# Patient Record
Sex: Male | Born: 1950 | Race: White | Hispanic: No | Marital: Married | Smoking: Never smoker
Health system: Southern US, Community
[De-identification: ages and names within clinical notes are randomized; demographics above are authoritative.]

## PROBLEM LIST (undated history)

## (undated) DIAGNOSIS — K529 Noninfective gastroenteritis and colitis, unspecified: Secondary | ICD-10-CM

## (undated) DIAGNOSIS — K760 Fatty (change of) liver, not elsewhere classified: Secondary | ICD-10-CM

## (undated) DIAGNOSIS — N400 Enlarged prostate without lower urinary tract symptoms: Secondary | ICD-10-CM

## (undated) DIAGNOSIS — E039 Hypothyroidism, unspecified: Secondary | ICD-10-CM

## (undated) DIAGNOSIS — J45909 Unspecified asthma, uncomplicated: Secondary | ICD-10-CM

## (undated) DIAGNOSIS — J309 Allergic rhinitis, unspecified: Secondary | ICD-10-CM

## (undated) DIAGNOSIS — I2510 Atherosclerotic heart disease of native coronary artery without angina pectoris: Secondary | ICD-10-CM

## (undated) DIAGNOSIS — T7840XA Allergy, unspecified, initial encounter: Secondary | ICD-10-CM

## (undated) DIAGNOSIS — E785 Hyperlipidemia, unspecified: Secondary | ICD-10-CM

## (undated) DIAGNOSIS — K589 Irritable bowel syndrome without diarrhea: Secondary | ICD-10-CM

## (undated) DIAGNOSIS — A0472 Enterocolitis due to Clostridium difficile, not specified as recurrent: Secondary | ICD-10-CM

## (undated) DIAGNOSIS — I519 Heart disease, unspecified: Secondary | ICD-10-CM

## (undated) HISTORY — DX: Hypothyroidism, unspecified: E03.9

## (undated) HISTORY — PX: TONSILLECTOMY: SUR1361

## (undated) HISTORY — DX: Benign prostatic hyperplasia without lower urinary tract symptoms: N40.0

## (undated) HISTORY — DX: Heart disease, unspecified: I51.9

## (undated) HISTORY — DX: Allergic rhinitis, unspecified: J30.9

## (undated) HISTORY — DX: Fatty (change of) liver, not elsewhere classified: K76.0

## (undated) HISTORY — DX: Irritable bowel syndrome, unspecified: K58.9

## (undated) HISTORY — DX: Enterocolitis due to Clostridium difficile, not specified as recurrent: A04.72

## (undated) HISTORY — DX: Unspecified asthma, uncomplicated: J45.909

## (undated) HISTORY — DX: Noninfective gastroenteritis and colitis, unspecified: K52.9

## (undated) HISTORY — DX: Allergy, unspecified, initial encounter: T78.40XA

## (undated) HISTORY — PX: EYE SURGERY: SHX253

## (undated) HISTORY — DX: Hyperlipidemia, unspecified: E78.5

## (undated) HISTORY — DX: Atherosclerotic heart disease of native coronary artery without angina pectoris: I25.10

---

## 1988-05-13 HISTORY — PX: LUMBAR LAMINECTOMY: SHX95

## 1999-04-13 ENCOUNTER — Ambulatory Visit (HOSPITAL_COMMUNITY): Admission: RE | Admit: 1999-04-13 | Discharge: 1999-04-13 | Payer: Self-pay | Admitting: Pulmonary Disease

## 1999-04-13 ENCOUNTER — Encounter: Payer: Self-pay | Admitting: Pulmonary Disease

## 2000-05-13 HISTORY — PX: CORONARY ARTERY BYPASS GRAFT: SHX141

## 2001-02-19 ENCOUNTER — Encounter: Payer: Self-pay | Admitting: Thoracic Surgery (Cardiothoracic Vascular Surgery)

## 2001-02-19 ENCOUNTER — Encounter: Payer: Self-pay | Admitting: Internal Medicine

## 2001-02-19 ENCOUNTER — Inpatient Hospital Stay (HOSPITAL_COMMUNITY): Admission: EM | Admit: 2001-02-19 | Discharge: 2001-02-25 | Payer: Self-pay | Admitting: Emergency Medicine

## 2001-02-20 ENCOUNTER — Encounter: Payer: Self-pay | Admitting: Thoracic Surgery (Cardiothoracic Vascular Surgery)

## 2001-02-21 ENCOUNTER — Encounter: Payer: Self-pay | Admitting: Thoracic Surgery (Cardiothoracic Vascular Surgery)

## 2001-02-22 ENCOUNTER — Encounter: Payer: Self-pay | Admitting: Thoracic Surgery (Cardiothoracic Vascular Surgery)

## 2001-02-24 ENCOUNTER — Encounter: Payer: Self-pay | Admitting: *Deleted

## 2001-02-24 ENCOUNTER — Encounter: Payer: Self-pay | Admitting: Thoracic Surgery (Cardiothoracic Vascular Surgery)

## 2001-03-31 ENCOUNTER — Encounter (HOSPITAL_COMMUNITY): Admission: RE | Admit: 2001-03-31 | Discharge: 2001-06-29 | Payer: Self-pay | Admitting: Cardiology

## 2001-06-30 ENCOUNTER — Encounter (HOSPITAL_COMMUNITY): Admission: RE | Admit: 2001-06-30 | Discharge: 2001-09-28 | Payer: Self-pay | Admitting: Cardiology

## 2001-08-12 ENCOUNTER — Inpatient Hospital Stay (HOSPITAL_COMMUNITY): Admission: EM | Admit: 2001-08-12 | Discharge: 2001-08-13 | Payer: Self-pay | Admitting: Emergency Medicine

## 2001-08-12 ENCOUNTER — Encounter: Payer: Self-pay | Admitting: Emergency Medicine

## 2001-08-13 ENCOUNTER — Encounter: Payer: Self-pay | Admitting: Cardiology

## 2002-12-10 ENCOUNTER — Ambulatory Visit (HOSPITAL_COMMUNITY): Admission: RE | Admit: 2002-12-10 | Discharge: 2002-12-10 | Payer: Self-pay | Admitting: Pulmonary Disease

## 2002-12-10 ENCOUNTER — Encounter: Payer: Self-pay | Admitting: Pulmonary Disease

## 2002-12-11 ENCOUNTER — Inpatient Hospital Stay (HOSPITAL_COMMUNITY): Admission: EM | Admit: 2002-12-11 | Discharge: 2002-12-20 | Payer: Self-pay | Admitting: Emergency Medicine

## 2002-12-11 ENCOUNTER — Encounter: Payer: Self-pay | Admitting: Internal Medicine

## 2002-12-12 ENCOUNTER — Encounter (INDEPENDENT_AMBULATORY_CARE_PROVIDER_SITE_OTHER): Payer: Self-pay | Admitting: Specialist

## 2002-12-13 ENCOUNTER — Encounter: Payer: Self-pay | Admitting: Gastroenterology

## 2002-12-14 ENCOUNTER — Encounter: Payer: Self-pay | Admitting: Internal Medicine

## 2003-04-04 ENCOUNTER — Encounter: Payer: Self-pay | Admitting: Gastroenterology

## 2004-08-01 ENCOUNTER — Ambulatory Visit: Payer: Self-pay | Admitting: Gastroenterology

## 2004-08-03 ENCOUNTER — Ambulatory Visit: Payer: Self-pay | Admitting: Gastroenterology

## 2004-10-12 ENCOUNTER — Ambulatory Visit: Payer: Self-pay | Admitting: Pulmonary Disease

## 2005-01-10 ENCOUNTER — Ambulatory Visit: Payer: Self-pay | Admitting: Gastroenterology

## 2005-01-28 ENCOUNTER — Ambulatory Visit: Payer: Self-pay | Admitting: Gastroenterology

## 2005-02-04 ENCOUNTER — Ambulatory Visit: Payer: Self-pay | Admitting: Nurse Practitioner

## 2005-02-07 ENCOUNTER — Ambulatory Visit: Payer: Self-pay | Admitting: Cardiology

## 2005-03-27 ENCOUNTER — Ambulatory Visit: Payer: Self-pay | Admitting: Cardiology

## 2005-11-14 ENCOUNTER — Ambulatory Visit: Payer: Self-pay | Admitting: Cardiology

## 2005-12-31 ENCOUNTER — Ambulatory Visit: Payer: Self-pay | Admitting: Gastroenterology

## 2006-05-02 ENCOUNTER — Ambulatory Visit: Payer: Self-pay | Admitting: Gastroenterology

## 2006-07-03 ENCOUNTER — Ambulatory Visit: Payer: Self-pay | Admitting: Pulmonary Disease

## 2006-10-07 ENCOUNTER — Ambulatory Visit: Payer: Self-pay | Admitting: Internal Medicine

## 2006-10-07 ENCOUNTER — Ambulatory Visit (HOSPITAL_COMMUNITY): Admission: RE | Admit: 2006-10-07 | Discharge: 2006-10-07 | Payer: Self-pay | Admitting: Internal Medicine

## 2006-10-07 LAB — CONVERTED CEMR LAB
Basophils Absolute: 0 10*3/uL (ref 0.0–0.1)
Eosinophils Absolute: 0.2 10*3/uL (ref 0.0–0.6)
GFR calc non Af Amer: 74 mL/min
HCT: 42.7 % (ref 39.0–52.0)
Hemoglobin, Urine: NEGATIVE
Hemoglobin: 14.5 g/dL (ref 13.0–17.0)
Ketones, ur: NEGATIVE mg/dL
Leukocytes, UA: NEGATIVE
Lymphocytes Relative: 28.8 % (ref 12.0–46.0)
MCHC: 34 g/dL (ref 30.0–36.0)
MCV: 93.6 fL (ref 78.0–100.0)
Monocytes Absolute: 0.8 10*3/uL — ABNORMAL HIGH (ref 0.2–0.7)
Neutro Abs: 4.7 10*3/uL (ref 1.4–7.7)
Neutrophils Relative %: 58.7 % (ref 43.0–77.0)
Potassium: 3.6 meq/L (ref 3.5–5.1)
Sodium: 144 meq/L (ref 135–145)
Urobilinogen, UA: 0.2 (ref 0.0–1.0)

## 2006-10-13 ENCOUNTER — Ambulatory Visit: Payer: Self-pay | Admitting: Pulmonary Disease

## 2006-10-13 LAB — CONVERTED CEMR LAB
ALT: 36 units/L (ref 0–40)
AST: 35 units/L (ref 0–37)
Alkaline Phosphatase: 43 units/L (ref 39–117)
BUN: 16 mg/dL (ref 6–23)
Bilirubin, Direct: 0.1 mg/dL (ref 0.0–0.3)
CO2: 27 meq/L (ref 19–32)
Calcium: 9.4 mg/dL (ref 8.4–10.5)
Chloride: 107 meq/L (ref 96–112)
Cholesterol: 103 mg/dL (ref 0–200)
GFR calc non Af Amer: 67 mL/min
Glucose, Bld: 107 mg/dL — ABNORMAL HIGH (ref 70–99)
Total Protein: 6.9 g/dL (ref 6.0–8.3)

## 2007-06-19 ENCOUNTER — Ambulatory Visit: Payer: Self-pay | Admitting: Cardiology

## 2007-06-19 LAB — CONVERTED CEMR LAB
ALT: 31 units/L (ref 0–53)
AST: 31 units/L (ref 0–37)
Alkaline Phosphatase: 49 units/L (ref 39–117)
BUN: 14 mg/dL (ref 6–23)
Basophils Relative: 0.4 % (ref 0.0–1.0)
Bilirubin, Direct: 0.2 mg/dL (ref 0.0–0.3)
CO2: 29 meq/L (ref 19–32)
Calcium: 9.6 mg/dL (ref 8.4–10.5)
Chloride: 106 meq/L (ref 96–112)
Creatinine, Ser: 1.1 mg/dL (ref 0.4–1.5)
Eosinophils Relative: 6.1 % — ABNORMAL HIGH (ref 0.0–5.0)
Glucose, Bld: 103 mg/dL — ABNORMAL HIGH (ref 70–99)
LDL Cholesterol: 70 mg/dL (ref 0–99)
Lymphocytes Relative: 31.8 % (ref 12.0–46.0)
Monocytes Relative: 13 % — ABNORMAL HIGH (ref 3.0–11.0)
Neutro Abs: 2.9 10*3/uL (ref 1.4–7.7)
Platelets: 251 10*3/uL (ref 150–400)
RDW: 11.8 % (ref 11.5–14.6)
Total Bilirubin: 0.7 mg/dL (ref 0.3–1.2)
Total Protein: 6.4 g/dL (ref 6.0–8.3)
Triglycerides: 169 mg/dL — ABNORMAL HIGH (ref 0–149)
VLDL: 34 mg/dL (ref 0–40)
WBC: 5.7 10*3/uL (ref 4.5–10.5)

## 2007-07-22 ENCOUNTER — Ambulatory Visit: Payer: Self-pay | Admitting: Cardiology

## 2008-04-22 ENCOUNTER — Encounter: Payer: Self-pay | Admitting: Pulmonary Disease

## 2008-05-12 ENCOUNTER — Ambulatory Visit: Payer: Self-pay | Admitting: Pulmonary Disease

## 2008-05-12 ENCOUNTER — Telehealth (INDEPENDENT_AMBULATORY_CARE_PROVIDER_SITE_OTHER): Payer: Self-pay | Admitting: *Deleted

## 2008-05-12 DIAGNOSIS — K589 Irritable bowel syndrome without diarrhea: Secondary | ICD-10-CM | POA: Insufficient documentation

## 2008-05-12 DIAGNOSIS — I2510 Atherosclerotic heart disease of native coronary artery without angina pectoris: Secondary | ICD-10-CM

## 2008-05-12 DIAGNOSIS — E039 Hypothyroidism, unspecified: Secondary | ICD-10-CM | POA: Insufficient documentation

## 2008-05-12 DIAGNOSIS — J209 Acute bronchitis, unspecified: Secondary | ICD-10-CM

## 2008-05-12 DIAGNOSIS — E785 Hyperlipidemia, unspecified: Secondary | ICD-10-CM

## 2008-05-12 DIAGNOSIS — J309 Allergic rhinitis, unspecified: Secondary | ICD-10-CM | POA: Insufficient documentation

## 2008-05-12 DIAGNOSIS — Z87898 Personal history of other specified conditions: Secondary | ICD-10-CM

## 2008-05-13 HISTORY — PX: COLONOSCOPY: SHX174

## 2008-12-09 ENCOUNTER — Telehealth: Payer: Self-pay | Admitting: Pulmonary Disease

## 2009-03-17 ENCOUNTER — Ambulatory Visit: Payer: Self-pay | Admitting: Gastroenterology

## 2009-03-17 ENCOUNTER — Telehealth (INDEPENDENT_AMBULATORY_CARE_PROVIDER_SITE_OTHER): Payer: Self-pay

## 2009-03-17 ENCOUNTER — Encounter (INDEPENDENT_AMBULATORY_CARE_PROVIDER_SITE_OTHER): Payer: Self-pay

## 2009-03-17 ENCOUNTER — Telehealth: Payer: Self-pay | Admitting: Cardiology

## 2009-03-28 ENCOUNTER — Ambulatory Visit: Payer: Self-pay | Admitting: Gastroenterology

## 2009-05-11 ENCOUNTER — Telehealth: Payer: Self-pay | Admitting: Cardiology

## 2009-05-15 ENCOUNTER — Ambulatory Visit: Payer: Self-pay | Admitting: Cardiology

## 2009-05-16 ENCOUNTER — Ambulatory Visit: Payer: Self-pay | Admitting: Cardiology

## 2009-05-16 ENCOUNTER — Encounter: Payer: Self-pay | Admitting: Physician Assistant

## 2009-05-16 DIAGNOSIS — R0609 Other forms of dyspnea: Secondary | ICD-10-CM

## 2009-05-16 DIAGNOSIS — R079 Chest pain, unspecified: Secondary | ICD-10-CM | POA: Insufficient documentation

## 2009-05-16 DIAGNOSIS — R0989 Other specified symptoms and signs involving the circulatory and respiratory systems: Secondary | ICD-10-CM

## 2009-05-17 ENCOUNTER — Telehealth (INDEPENDENT_AMBULATORY_CARE_PROVIDER_SITE_OTHER): Payer: Self-pay | Admitting: *Deleted

## 2009-05-18 ENCOUNTER — Ambulatory Visit: Payer: Self-pay | Admitting: Internal Medicine

## 2009-05-18 ENCOUNTER — Encounter: Payer: Self-pay | Admitting: Cardiology

## 2009-05-18 ENCOUNTER — Ambulatory Visit: Payer: Self-pay

## 2009-05-18 ENCOUNTER — Encounter (HOSPITAL_COMMUNITY): Admission: RE | Admit: 2009-05-18 | Discharge: 2009-07-17 | Payer: Self-pay | Admitting: Cardiology

## 2009-05-18 ENCOUNTER — Encounter: Payer: Self-pay | Admitting: Cardiovascular Disease

## 2009-05-22 LAB — CONVERTED CEMR LAB
ALT: 37 units/L (ref 0–53)
AST: 33 units/L (ref 0–37)
BUN: 11 mg/dL (ref 6–23)
Basophils Relative: 0.7 % (ref 0.0–3.0)
Bilirubin, Direct: 0.1 mg/dL (ref 0.0–0.3)
Calcium: 9.3 mg/dL (ref 8.4–10.5)
Eosinophils Absolute: 0.4 10*3/uL (ref 0.0–0.7)
Eosinophils Relative: 6.7 % — ABNORMAL HIGH (ref 0.0–5.0)
GFR calc non Af Amer: 66.15 mL/min (ref >60)
Glucose, Bld: 101 mg/dL — ABNORMAL HIGH (ref 70–99)
HCT: 43.2 % (ref 39.0–52.0)
Lymphs Abs: 1.9 10*3/uL (ref 0.7–4.0)
MCHC: 34.1 g/dL (ref 30.0–36.0)
MCV: 95.1 fL (ref 78.0–100.0)
Monocytes Absolute: 0.6 10*3/uL (ref 0.1–1.0)
Neutrophils Relative %: 46.5 % (ref 43.0–77.0)
Platelets: 200 10*3/uL (ref 150.0–400.0)
Potassium: 4.2 meq/L (ref 3.5–5.1)
Sodium: 141 meq/L (ref 135–145)
TSH: 2.99 microintl units/mL (ref 0.35–5.50)
Total Bilirubin: 0.7 mg/dL (ref 0.3–1.2)
Total CHOL/HDL Ratio: 5
Triglycerides: 184 mg/dL — ABNORMAL HIGH (ref 0.0–149.0)
WBC: 5.3 10*3/uL (ref 4.5–10.5)

## 2009-05-31 ENCOUNTER — Encounter (INDEPENDENT_AMBULATORY_CARE_PROVIDER_SITE_OTHER): Payer: Self-pay | Admitting: *Deleted

## 2009-08-08 ENCOUNTER — Encounter: Payer: Self-pay | Admitting: Pulmonary Disease

## 2009-08-14 ENCOUNTER — Ambulatory Visit: Payer: Self-pay | Admitting: Cardiology

## 2009-08-16 ENCOUNTER — Telehealth: Payer: Self-pay | Admitting: Pulmonary Disease

## 2009-08-16 LAB — CONVERTED CEMR LAB
AST: 34 units/L (ref 0–37)
Alkaline Phosphatase: 41 units/L (ref 39–117)
Bilirubin, Direct: 0.1 mg/dL (ref 0.0–0.3)
LDL Cholesterol: 61 mg/dL (ref 0–99)
Total CHOL/HDL Ratio: 3

## 2009-11-22 ENCOUNTER — Ambulatory Visit: Payer: Self-pay | Admitting: Pulmonary Disease

## 2009-11-27 DIAGNOSIS — H60399 Other infective otitis externa, unspecified ear: Secondary | ICD-10-CM | POA: Insufficient documentation

## 2010-01-17 ENCOUNTER — Telehealth: Payer: Self-pay | Admitting: Gastroenterology

## 2010-01-18 ENCOUNTER — Ambulatory Visit: Payer: Self-pay | Admitting: Physician Assistant

## 2010-01-18 ENCOUNTER — Ambulatory Visit: Payer: Self-pay | Admitting: Gastroenterology

## 2010-01-18 ENCOUNTER — Telehealth: Payer: Self-pay | Admitting: Internal Medicine

## 2010-01-18 DIAGNOSIS — I10 Essential (primary) hypertension: Secondary | ICD-10-CM

## 2010-01-18 DIAGNOSIS — R1032 Left lower quadrant pain: Secondary | ICD-10-CM

## 2010-01-18 DIAGNOSIS — K648 Other hemorrhoids: Secondary | ICD-10-CM | POA: Insufficient documentation

## 2010-01-18 DIAGNOSIS — E0590 Thyrotoxicosis, unspecified without thyrotoxic crisis or storm: Secondary | ICD-10-CM | POA: Insufficient documentation

## 2010-01-18 DIAGNOSIS — K310 Acute dilatation of stomach: Secondary | ICD-10-CM

## 2010-01-18 LAB — CONVERTED CEMR LAB
ALT: 34 units/L (ref 0–53)
Alkaline Phosphatase: 41 units/L (ref 39–117)
Basophils Absolute: 0 10*3/uL (ref 0.0–0.1)
CO2: 29 meq/L (ref 19–32)
Creatinine, Ser: 1.1 mg/dL (ref 0.4–1.5)
Eosinophils Absolute: 0.3 10*3/uL (ref 0.0–0.7)
GFR calc non Af Amer: 71.46 mL/min (ref >60)
HCT: 44.5 % (ref 39.0–52.0)
Hemoglobin: 15.2 g/dL (ref 13.0–17.0)
Lymphs Abs: 2.2 10*3/uL (ref 0.7–4.0)
MCHC: 34.2 g/dL (ref 30.0–36.0)
MCV: 95.4 fL (ref 78.0–100.0)
Monocytes Absolute: 0.6 10*3/uL (ref 0.1–1.0)
Neutro Abs: 3.6 10*3/uL (ref 1.4–7.7)
Platelets: 233 10*3/uL (ref 150.0–400.0)
RDW: 13.4 % (ref 11.5–14.6)
Total Bilirubin: 0.7 mg/dL (ref 0.3–1.2)

## 2010-01-19 ENCOUNTER — Telehealth: Payer: Self-pay | Admitting: Physician Assistant

## 2010-01-19 ENCOUNTER — Ambulatory Visit: Payer: Self-pay | Admitting: Internal Medicine

## 2010-06-12 NOTE — Medication Information (Signed)
Summary: Prescription Refill Order  Prescription Refill Order   Imported By: Roderic Ovens 05/26/2009 13:16:53  _____________________________________________________________________  External Attachment:    Type:   Image     Comment:   External Document

## 2010-06-12 NOTE — Assessment & Plan Note (Signed)
Summary: Cardiology Nuclear Study  Nuclear Med Background Indications for Stress Test: Evaluation for Ischemia, Graft Patency  Indications Comments: Recent Colonoscopy with decrease in O2 sat on O2 had flu with URI  History: CABG, COPD, Heart Catheterization, Myocardial Perfusion Study  History Comments: 10/02 CABG X 5 02/03 MPS (-) ischemia (-) scar  64% 04/03 Heart Cath EF 60% patent Grafts COPD  Symptoms: DOE    Nuclear Pre-Procedure Cardiac Risk Factors: Lipids Caffeine/Decaff Intake: None NPO After: 9:00 PM Lungs: clear IV 0.9% NS with Angio Cath: 22g     IV Site: (R) AC IV Started by: Irean Hong RN Chest Size (in) 44     Height (in): 68 Weight (lb): 188 BMI: 28.69  Nuclear Med Study 1 or 2 day study:  1 day     Stress Test Type:  Stress Reading MD:  Dietrich Pates, MD     Referring MD:  B.Brodie Resting Radionuclide:  Technetium 33m Tetrofosmin     Resting Radionuclide Dose:  11.0 mCi  Stress Radionuclide:  Technetium 35m Tetrofosmin     Stress Radionuclide Dose:  33.0 mCi   Stress Protocol Exercise Time (min):  11:30 min     Max HR:  139 bpm     Predicted Max HR:  163 bpm  Max Systolic BP: 168 mm Hg     Percent Max HR:  85.28 %     METS: 13.4 Rate Pressure Product:  69629    Stress Test Technologist:  Milana Na EMT-P     Nuclear Technologist:  Burna Mortimer Deal RT-N  Rest Procedure  Myocardial perfusion imaging was performed at rest 45 minutes following the intravenous administration of Myoview Technetium 32m Tetrofosmin.  Stress Procedure  The patient exercised for 11:30.  The patient stopped due to fatigue and chest pain.  There were no significant ST-T wave changes.  Myoview was injected at peak exercise and myocardial perfusion imaging was performed after a brief delay.  QPS Raw Data Images:  Images were motion corrected.  Soft tissue (diaphragm) underlies heart. Stress Images:  There is normal uptake in all areas. Rest Images:  Normal homogeneous uptake  in all areas of the myocardium. Subtraction (SDS):  No evidence of ischemia. Transient Ischemic Dilatation:  .85  (Normal <1.22)  Lung/Heart Ratio:  .39  (Normal <0.45)  Quantitative Gated Spect Images QGS EDV:  57 ml QGS ESV:  16 ml QGS EF:  71 %   Overall Impression  Exercise Capacity: Excellent exercise capacity. BP Response: Normal blood pressure response. Clinical Symptoms: Mild chest pain/dyspnea. ECG Impression: No significant ST segment change suggestive of ischemia. Overall Impression: Normal stress nuclear study.  Appended Document: Cardiology Nuclear Study Heather, Can you tell ok. f/u 1 yr. BB  Appended Document: Cardiology Nuclear [facility].  Appended Document: Cardiology Nuclear [facility].  Appended Document: Cardiology Nuclear [facility].  Appended Document: Cardiology Nuclear Study Letter of results mailed to the pt.

## 2010-06-12 NOTE — Miscellaneous (Signed)
Summary: Outpatient Coinsurance Notice  Outpatient Coinsurance Notice   Imported By: Marylou Mccoy 05/23/2009 15:38:31  _____________________________________________________________________  External Attachment:    Type:   Image     Comment:   External Document

## 2010-06-12 NOTE — Letter (Signed)
Summary: Generic Letter  Architectural technologist, Main Office  [address]   [place], [place] [address]   Phone: [phone]  Fax: [phone]        May 31, 2009 MRN: [id number]    Samuel Flowers [address] [place], [place]  [address]    Dear Mr. Wessell,  I have been trying to reach you by phone to let you know that Dr. Juanda Chance has reviewed your stress test results and they are ok. You are scheduled to followup with Dr. Juanda Chance on 07/07/09 @ 8:45am, however, he has stated that since your stress test is alright, that you really do not need to see him back for a year. If you wish to keep your appointment in February with Dr. Juanda Chance, that is fine though. Please contact our office if you wish to cancel your February appointment.          Sincerely,  Sherri Rad, RN, BSN  This letter has been electronically signed by your physician.

## 2010-06-12 NOTE — Progress Notes (Signed)
Summary: RX refill   Phone Note Outgoing Call   Call placed by: Sherri Rad, RN, BSN,  August 16, 2009 10:25 AM Call placed to: Patient Summary of Call: I called the pt with his lab results. He asked if we could send in an RX refill for his Synthroid. I made the pt aware that we usually do not do this. Dr. Kriste Basque is who usually prescribes this. He asked if we could contact Dr. Jodelle Green office. I made the pt aware that I would forward a message to them. He gets his meds filled throught [facility]. He asked if Dr. Jodelle Green office could please contact him when this has been done.  Initial call taken by: Sherri Rad, RN, BSN,  August 16, 2009 10:25 AM  Follow-up for Phone Call        pt was last seen in 2009---he will need ov with SN for any refills.  thanks Randell Loop CMA  August 16, 2009 10:54 AM   LMTCB. Carron Curie CMA  August 16, 2009 11:31 AM  LMTCB x2. Carron Curie CMA  August 17, 2009 11:15 AM  LMTCBx3. Carron Curie CMA  August 18, 2009 3:25 PM   Additional Follow-up for Phone Call Additional follow up Details #1::        SN booked until June- is it okay to wait until then or do you want to see him sooner? Please advise, thanks Vernie Murders  August 18, 2009 3:54 PM   CAN USE THE 4-12 AT 10:30 FOR HIM. per SN you can send in his synthroid  1 po qd  #90 for him but he will still need to be seen. THANKS Randell Loop [facility]  August 18, 2009 4:04 PM     Additional Follow-up for Phone Call Additional follow up Details #2::    LMTCB Vernie Murders  August 18, 2009 4:06 PM   Left message with assistant for pt to call back for Triage, however will need a new appointment date. Please advise. Zackery Barefoot CMA  August 22, 2009 10:17 AM    5-20 AT 11:30.  Sheppard Evens CMA  August 22, 2009 10:26 AM   Additional Follow-up for Phone Call Additional follow up Details #3:: Details for Additional Follow-up Action Taken: LM at [phone] and [phone] for pt to call us  about synthroid rx and appt date. Additional Follow-up by: Abigail Miyamoto RN,  August 24, 2009 9:49 AM  Pt  returned call, call pt at [phone]. taken by [facility]/jrc  Called pt back and he states he was about to go into a meeting so he couldnt talk. i told him that Sn had one available slot on 09-29-09 at 11:30. I would schedule him for that appt and if there was an issue with it he could call and r/s, but that there were no other appts until later in summer. Pt scheduled for appt. Carron Curie CMA  August 25, 2009 1:57 PM  Appended Document: RX refill after getting off the phone, i checked the schedule and this appt had already been taken, so per [place] ok to offer 09-15-09 at 10am, advise pt to come in fasting. I called and LM on pt cell of teh change and advised him to call back to confirm the appt.

## 2010-06-12 NOTE — Assessment & Plan Note (Signed)
Summary: rov      Allergies Added: NKDA  Visit Type:  rov  CC:  pt had colonoscopy 3 weeks ago and was told that his O2 was low during procedure even w/O2 on...  History of Present Illness: This is a 60 year old white male patient, who is here today because of an oxygen sat dropped while undergoing colonoscopy. He now thinks he may have had the flu and upper respiratory infection prior to undergoing colonoscopy and that  his O2 sats were down because of this. He walks 40-60 minutes upgrade on a treadmill for 3-1/2 miles per hour 3 times a week without symptoms. He occasionally gets out of breath when going up stairs, but this has not changed recently. He has a history of coronary artery disease, status post CABG in 2002, and last catheter in 2003, which showed patent grafts that stenosis and a posterolateral branch. That was not grafted and was treated medically. He denies chest pain, palpitations, dyspnea, dizziness, or presyncope.  He did have fasting lipid panel drawn yesterday, and all his lab values are up compared to 2 years ago, when he had them checked. He states he has not changed. His diet and I had already scheduled him to see the lipid clinic. When he realized he has not been taking his Crestor since last June. He uses [facility] and says he has trouble keeping track of his medications.  Current Medications (verified): 1)  Claritin 10 Mg Tabs (Loratadine) .... Take 1 Tab By Mouth Once Daily As Needed For Allergy Symptoms.Marland KitchenMarland Kitchen 2)  Aspirin Adult Low Strength 81 Mg Tbec (Aspirin) .... Take 1 Tablet By Mouth Once A Day 3)  Plavix 75 Mg Tabs (Clopidogrel Bisulfate) .... Take 1 Tablet By Mouth Once A Day 4)  Crestor 10 Mg Tabs (Rosuvastatin Calcium) .... Take 1 Tablet By Mouth Once A Day 5)  Tricor 145 Mg Tabs (Fenofibrate) .... Take 1 Tablet By Mouth Once A Day 6)  Synthroid 75 Mcg Tabs (Levothyroxine Sodium) .... Take 1 Tablet By Mouth Once A Day 7)  Flomax 0.4 Mg Xr24h-Cap (Tamsulosin Hcl)  .... Take 2 Tablets By Mouth Once Daily  Allergies (verified): No Known Drug Allergies  Past History:  Past Medical History: Last updated: 05/12/2008   ALLERGIC RHINITIS (ICD-477.9) ASTHMATIC BRONCHITIS, ACUTE (ICD-466.0) CORONARY ARTERY DISEASE (ICD-414.00) HYPERLIPIDEMIA (ICD-272.4) HYPOTHYROIDISM (ICD-244.9) IRRITABLE BOWEL SYNDROME (ICD-564.1) BENIGN PROSTATIC HYPERTROPHY, MILD, HX OF (ICD-V13.8)  Past Surgical History: Last updated: 05/12/2008 S/P T & A S/P coronary artery bypass graft -2002 S/P lumbar laminectomy  ~ 1990  Review of Systems       see history of present illness  Vital Signs:  Patient profile:   60 year old male Height:      68 inches Weight:      189 pounds BMI:     28.84 O2 Sat:      91 % on Room air Pulse rate:   55 / minute Pulse rhythm:   irregular BP sitting:   128 / 72  (right arm) Cuff size:   regular  Vitals Entered By: Danielle Rankin, CMA (May 16, 2009 10:18 AM)  O2 Flow:  Room air  Physical Exam  General:   Well-nournished, in no acute distress. Neck: No JVD, HJR, Bruit, or thyroid enlargement Lungs: No tachypnea, clear without wheezing, rales, or rhonchi Cardiovascular: RRR, PMI not displaced, heart sounds normal, no murmurs, gallops, bruit, thrill, or heave. Abdomen: BS normal. Soft without organomegaly, masses, lesions or tenderness. Extremities: without cyanosis,  clubbing or edema. Good distal pulses bilateral SKin: Warm, no lesions or rashes  Musculoskeletal: No deformities Neuro: no focal signs    Impression & Recommendations:  Problem # 1:  DYSPNEA ON EXERTION (ICD-786.09) Patient had a drop in his O2 sat while undergone colonoscopy. Not sure if this was due to this the anesthesia and his possible upper respiratory infection that he had prior to undergoing this. O2 Sat when he was laying down here in the office was 91% on room air and when he sat up, was 95%. His lungs are clear. He does have some dyspnea when he goes  up stairs. With his history of coronary artery disease. Will schedule an exercise Myoview for further evaluation His updated medication list for this problem includes:    Aspirin Adult Low Strength 81 Mg Tbec (Aspirin) .Marland Kitchen... Take 1 tablet by mouth once a day  Orders: Nuclear Stress Test (Nuc Stress Test)  Problem # 2:  CORONARY ARTERY DISEASE (ICD-414.00) Patient has history of CABG in 2002. Last catheter in 2003. See history of present illness. We'll check a stress Myoview to rule out ischemia. His updated medication list for this problem includes:    Aspirin Adult Low Strength 81 Mg Tbec (Aspirin) .Marland Kitchen... Take 1 tablet by mouth once a day    Plavix 75 Mg Tabs (Clopidogrel bisulfate) .Marland Kitchen... Take 1 tablet by mouth once a day  Orders: Nuclear Stress Test (Nuc Stress Test)  Problem # 3:  HYPERLIPIDEMIA (ICD-272.4) Patient's cholesterol was up to 209, triglycerides 184, HDL was actually good at 42, and LDL was up at 138. All these values were elevated compared to his last tracings. He now admits to this document his Crestor inadvertently back in June. We will restart his Crestor and recheck his lipid panels in 3 months. His updated medication list for this problem includes:    Crestor 10 Mg Tabs (Rosuvastatin calcium) .Marland Kitchen... Take 1 tablet by mouth once a day    Tricor 145 Mg Tabs (Fenofibrate) .Marland Kitchen... Take 1 tablet by mouth once a day  Patient Instructions: 1)  Your physician recommends that you schedule a follow-up appointment in: 1 - 2 months with Dr. Juanda Chance. 2)  Your physician has requested that you have an exercise stress myoview.  For further information please visit [email or web address]  Please follow instruction sheet, as given. 3)  [facility]  [phone].  You will need your member number and the prescription number(s) on the prescription bottles. Prescriptions: TRICOR 145 MG TABS (FENOFIBRATE) Take 1 tablet by mouth once a day  #90 x 3   Entered by:   Minerva Areola, RN, BSN    Authorized by:   Marletta Lor, PA-C   Signed by:   Minerva Areola, RN, BSN on 05/16/2009   Method used:   Print then Give to Patient   RxID:   [id number] CRESTOR 10 MG TABS (ROSUVASTATIN CALCIUM) Take 1 tablet by mouth once a day  #90 x 3   Entered by:   Minerva Areola, RN, BSN   Authorized by:   Marletta Lor, PA-C   Signed by:   Minerva Areola, RN, BSN on 05/16/2009   Method used:   Print then Give to Patient   RxID:   [id number] PLAVIX 75 MG TABS (CLOPIDOGREL BISULFATE) Take 1 tablet by mouth once a day  #90 x 3   Entered by:   Minerva Areola, RN, BSN   Authorized by:   Marletta Lor, PA-C   Signed by:  Minerva Areola, RN, BSN on 05/16/2009   Method used:   Print then Give to Patient   RxID:   [id number]

## 2010-06-12 NOTE — Progress Notes (Signed)
Summary: info update   Phone Note Call from Patient   Caller: Patient Call For: Samuel Flowers Reason for Call: Talk to Nurse Summary of Call: would like to be called at [phone] with CT scan results... and if any meds need to becalled in, pt would like it sent to [facility] [place] Initial call taken by: [name],  January 19, 2010 2:50 PM  Follow-up for Phone Call        Samuel Gip PA spoke with the patient on Friday Follow-up by: [name] RN, CGRN,  January 22, 2010 8:22 AM

## 2010-06-12 NOTE — Progress Notes (Signed)
Summary: Nuclear Pre-Procedure  Phone Note Outgoing Call   Call placed by: Milana Na, EMT-P,  May 17, 2009 2:04 PM Summary of Call: Left message with information on Myoview Information Sheet (see scanned document for details).      Nuclear Med Background Indications for Stress Test: Evaluation for Ischemia, Graft Patency  Indications Comments: Recent Colonoscopy with decrease in O2 sat on O2 had flu with URI  History: CABG, COPD, Heart Catheterization, Myocardial Perfusion Study  History Comments: 10/02 CABG X 5 02/03 MPS (-) ischemia (-) scar  64% 04/03 Heart Cath EF 60% patent Grafts COPD  Symptoms: DOE    Nuclear Pre-Procedure Cardiac Risk Factors: Lipids Height (in): 68  Nuclear Med Study Referring MD:  B.Juanda Chance

## 2010-06-12 NOTE — Progress Notes (Signed)
Summary: triage   Phone Note Call from Patient Call back at [phone]   Caller: Patient Call For: Dr. Russella Dar Reason for Call: Talk to Nurse Summary of Call: abd pain, bloating, cramping... would like to discuss with a nurse before scheduling an appt Initial call taken by: [name],  January 17, 2010 8:10 AM  Follow-up for Phone Call        patient with hx of colitis and IBS.  For past several days lower abdominal pain and diarrhea.  Patient is traveling next week and would like to be seen before he leaves the country.  Patient  will come in and see [name] PA tomorrow at 2:00 Follow-up by: [name] RN, CGRN,  January 17, 2010 9:56 AM

## 2010-06-12 NOTE — Letter (Signed)
[facility]: [facility]   Imported By: Sherian Rein 08/23/2009 11:59:33  _____________________________________________________________________  External Attachment:    Type:   Image     Comment:   External Document

## 2010-06-12 NOTE — Progress Notes (Signed)
Summary: ? labs before visit   Phone Note Call from Patient   Summary of Call: called at about 7 AM he is wondering if he needs labs for todays visit very concerned about getting things set up before he travels, leaving first of next week  Have him get a cbc, CMET and sed rate this AM so Amy has it available today...use 789.09 as dx if not on list Initial call taken by: Iva Boop MD, Clementeen Graham,  January 18, 2010 8:27 AM  Follow-up for Phone Call        The pt was told to come in this afternoon before his appt with [place] at 2Pm and we will draw labs on him. Follow-up by: Joselyn Glassman,  January 18, 2010 2:04 PM

## 2010-06-12 NOTE — Assessment & Plan Note (Signed)
Summary: IBS flare/sheri    History of Present Illness Visit Type: new patient  Primary GI MD: Elie Goody MD [facility] Primary Provider: Michele Mcalpine, MD  Requesting Provider: na Chief Complaint: GERD, lower abd pain, bloating, and change in bowel habits  History of Present Illness:   60 YO MALE KNOWN TO DR. Kristin Bruins SEEN IN 2010,WHEN HE HAD A COLONOSCOPY. THIS WAS NORMAL,EXCEPT INTERNAL HEMORRHOIDS.INTERESTINGLY HE WAS DX WITH DIVERTICULITIS IN 2008 PER CT WITH PERICOLONOC STARNDING AT JUNCTION OF SIGMOID AND  DESCENDING COLON. HE WAS ALSO QUITE ILL IN 2004 WITH C. DIFF. COLITIS.,AND WAS HOSPITALIZED. HE COMES IN NOW WITH  4 DAY HX OF ABDOMINAL BLOATING  AND PAIN. ONSET AFTER HE ATE RAW CORN AND FRIED GREEN TOMATOES. HE HAS HAD PERIODIC EPISODES OF BLOATING ,AND IRREGULAR BOWEL HABITS BUT THIS PAIN IS WORSE.HE IS CONCERNED AS HE IS GOING ON A TRIP NEXT WEEK,IN [place], THEN THE FOLLOWING WEEK TO [place]. NO FEVER. NO NAUSEA OR VOMITING. HE IS HURTING PRIMARILY IN THE LEFT ABDOMEN,WITH A CRAMPY  TYPE PAIN.    GI Review of Systems    Reports abdominal pain, acid reflux, bloating, and  heartburn.     Location of  Abdominal pain: lower abdomen.    Denies belching, chest pain, dysphagia with liquids, dysphagia with solids, loss of appetite, nausea, vomiting, vomiting blood, weight loss, and  weight gain.      Reports change in bowel habits and  irritable bowel syndrome.     Denies anal fissure, black tarry stools, constipation, diarrhea, diverticulosis, fecal incontinence, heme positive stool, hemorrhoids, jaundice, light color stool, liver problems, rectal bleeding, and  rectal pain.    Current Medications (verified): 1)  Claritin 10 Mg Tabs (Loratadine) .... Take 1 Tab By Mouth Once Daily As Needed For Allergy Symptoms.Marland KitchenMarland Kitchen 2)  Aspirin Adult Low Strength 81 Mg Tbec (Aspirin) .... Take 1 Tablet By Mouth Once A Day 3)  Plavix 75 Mg Tabs (Clopidogrel Bisulfate) .... Take 1 Tablet By Mouth  Once A Day 4)  Crestor 10 Mg Tabs (Rosuvastatin Calcium) .... Take 1 Tablet By Mouth Once A Day 5)  Tricor 145 Mg Tabs (Fenofibrate) .... Take 1 Tablet By Mouth Once A Day 6)  Synthroid 75 Mcg Tabs (Levothyroxine Sodium) .... Take 1 Tablet By Mouth Once A Day 7)  Flomax 0.4 Mg Xr24h-Cap (Tamsulosin Hcl) .... Take 2 Tablets By Mouth Once Daily  Allergies (verified): No Known Drug Allergies  Past History:  Past Medical History: ALLERGIC RHINITIS (ICD-477.9) ASTHMATIC BRONCHITIS, ACUTE (ICD-466.0) CORONARY ARTERY DISEASE (ICD-414.00)-S/P CABG 2002 HYPERLIPIDEMIA (ICD-272.4) HYPOTHYROIDISM (ICD-244.9) IRRITABLE BOWEL SYNDROME (ICD-564.1) BENIGN PROSTATIC HYPERTROPHY, MILD, HX OF (ICD-V13.8) HX OF C.DIFF 2004,  Past Surgical History: Reviewed history from 05/12/2008 and no changes required. S/P T & A S/P coronary artery bypass graft -2002 S/P lumbar laminectomy  ~ 1990  Family History: emphysema - mother asthma - brother heart disease - father DM - father No FH of Colon Cancer:  Social History: never smoked social alcohol married 2 children works in Insurance account manager Daily Caffeine Use: 2 daily Illicit Drug Use - no Drug Use:  no  Review of Systems  The patient denies allergy/sinus, anemia, anxiety-new, arthritis/joint pain, back pain, blood in urine, breast changes/lumps, change in vision, confusion, cough, coughing up blood, depression-new, fainting, fatigue, fever, headaches-new, hearing problems, heart murmur, heart rhythm changes, itching, menstrual pain, muscle pains/cramps, night sweats, nosebleeds, pregnancy symptoms, shortness of breath, skin rash, sleeping problems, sore throat, swelling of feet/legs, swollen lymph  glands, thirst - excessive , urination - excessive , urination changes/pain, urine leakage, vision changes, and voice change.         SEE HPI  Vital Signs:  Patient profile:   60 year old male Height:      68 inches Weight:      190 pounds BMI:      28.99 BSA:     2.00 Pulse rate:   56 / minute Pulse rhythm:   irregular BP sitting:   126 / 64  (left arm) Cuff size:   regular  Vitals Entered By: Ok Anis CMA (January 18, 2010 2:09 PM)  Physical Exam  General:  Well developed, well nourished, no acute distress. Head:  Normocephalic and atraumatic. Eyes:  PERRLA, no icterus. Lungs:  Clear throughout to auscultation. Heart:  Regular rate and rhythm; no murmurs, rubs,  or bruits.,STERNAL SCAR Abdomen:  SOFT, BS+, NO PALP MASS OR HSM,BS =, TENDER LLQ/LMQ,NO REAL GUARDING BUT HAS REBOUND ON THE LEFT. Rectal:  NOT DONE Extremities:  No clubbing, cyanosis, edema or deformities noted. Neurologic:  Alert and  oriented x4;  grossly normal neurologically. Psych:  Alert and cooperative. Normal mood and affect.   Impression & Recommendations:  Problem # 1:  ABDOMINAL PAIN, LEFT LOWER QUADRANT (ICD-789.04) Assessment New 60 YO MALE WITH  REMOTE HX OF "COLITIS",IN THE 80'S,NOT CONFIRMED ON FOLLOW UP PROCEDURES. HX OF SEVERE C. DIFF 2004,TREATED FOR DIVERTICULITIS IN 2008 PER CT WITH PERICOLONIC STARNDING. NORMAL COLON 2010 WITH NO DIVERTICULI NOTED. NOW WITH 4 DAY HX OF LEFT ABDOMINAL PAIN,BLOATIG, CRAMPING.  R/O IBS,R/O DIVERTICULITIS ?SUBTLE TICS?  LABS AS BELOW CT ABDOMEN /PELVIS-FEEL THIS IS INDICATED FOR DX, AND IN LIGHT OF HIS UPCOMING TRIP OUT OF THE COUNTRY. FURTHER PALNS PENDING ABOVE Orders: CT Abdomen/Pelvis with Contrast (CT Abd/Pelvis w/con)  Problem # 2:  HEMORRHOIDS-INTERNAL (ICD-455.0) Assessment: Comment Only  Problem # 3:  CORONARY ARTERY DISEASE (ICD-414.00) Assessment: Comment Only  Problem # 4:  HYPERTENSION (ICD-401.9) Assessment: Comment Only  Patient Instructions: 1)  We schedueld the CT scan at [facility] CT [address]  2)  We will be calling you with the results.  They will be calling Amy Esterwood tomorrow afternoon  01-19-10 with the results.  3)  Copy sent to : Alroy Dust, Md 4)  The medication  list was reviewed and reconciled.  All changed / newly prescribed medications were explained.  A complete medication list was provided to the patient / caregiver.

## 2010-06-12 NOTE — Assessment & Plan Note (Signed)
Summary: Acute NP office visit - swimmer's ear   CC:  left earache with pain when swallowing x2days - states was at the beach this past weekend.  History of Present Illness: 60 y/o WM   11/22/09--Presents for a left earache with pain when swallowing x2days - states was at the beach this past weekend. No fever, known injury, draiange. No meds for tx. No change in hearing. Denies chest pain, dyspnea, orthopnea, hemoptysis, fever, n/v/d, edema, headache.      Preventive Screening-Counseling & Management  Alcohol-Tobacco     Smoking Status: never  Medications Prior to Update: 1)  Claritin 10 Mg Tabs (Loratadine) .... Take 1 Tab By Mouth Once Daily As Needed For Allergy Symptoms.Marland KitchenMarland Kitchen 2)  Aspirin Adult Low Strength 81 Mg Tbec (Aspirin) .... Take 1 Tablet By Mouth Once A Day 3)  Plavix 75 Mg Tabs (Clopidogrel Bisulfate) .... Take 1 Tablet By Mouth Once A Day 4)  Crestor 10 Mg Tabs (Rosuvastatin Calcium) .... Take 1 Tablet By Mouth Once A Day 5)  Tricor 145 Mg Tabs (Fenofibrate) .... Take 1 Tablet By Mouth Once A Day 6)  Synthroid 75 Mcg Tabs (Levothyroxine Sodium) .... Take 1 Tablet By Mouth Once A Day 7)  Flomax 0.4 Mg Xr24h-Cap (Tamsulosin Hcl) .... Take 2 Tablets By Mouth Once Daily  Current Medications (verified): 1)  Claritin 10 Mg Tabs (Loratadine) .... Take 1 Tab By Mouth Once Daily As Needed For Allergy Symptoms.Marland KitchenMarland Kitchen 2)  Aspirin Adult Low Strength 81 Mg Tbec (Aspirin) .... Take 1 Tablet By Mouth Once A Day 3)  Plavix 75 Mg Tabs (Clopidogrel Bisulfate) .... Take 1 Tablet By Mouth Once A Day 4)  Crestor 10 Mg Tabs (Rosuvastatin Calcium) .... Take 1 Tablet By Mouth Once A Day 5)  Tricor 145 Mg Tabs (Fenofibrate) .... Take 1 Tablet By Mouth Once A Day 6)  Synthroid 75 Mcg Tabs (Levothyroxine Sodium) .... Take 1 Tablet By Mouth Once A Day 7)  Flomax 0.4 Mg Xr24h-Cap (Tamsulosin Hcl) .... Take 2 Tablets By Mouth Once Daily  Allergies (verified): No Known Drug Allergies  Past  History:  Past Medical History: Last updated: 05/12/2008   ALLERGIC RHINITIS (ICD-477.9) ASTHMATIC BRONCHITIS, ACUTE (ICD-466.0) CORONARY ARTERY DISEASE (ICD-414.00) HYPERLIPIDEMIA (ICD-272.4) HYPOTHYROIDISM (ICD-244.9) IRRITABLE BOWEL SYNDROME (ICD-564.1) BENIGN PROSTATIC HYPERTROPHY, MILD, HX OF (ICD-V13.8)  Past Surgical History: Last updated: 05/12/2008 S/P T & A S/P coronary artery bypass graft -2002 S/P lumbar laminectomy  ~ 1990  Family History: Last updated: 11/22/2009 emphysema - mother asthma - brother heart disease - father DM - father  Social History: Last updated: 11/22/2009 never smoked social alcohol married 2 children works in Insurance account manager  Risk Factors: Smoking Status: never (11/22/2009)  Family History: emphysema - mother asthma - brother heart disease - father DM - father  Social History: never smoked social alcohol married 2 children works in Emergency planning/management officer Status:  never  Review of Systems      See HPI  Vital Signs:  Patient profile:   60 year old male Height:      68 inches Weight:      191.38 pounds BMI:     29.20 O2 Sat:      96 % on Room air Temp:     98.8 degrees F oral Pulse rate:   50 / minute BP sitting:   120 / 60  (left arm) Cuff size:   regular  Vitals Entered By: Boone Master CNA/MA (November 22, 2009 12:12 PM)  O2 Flow:  Room air  CC: left earache with pain when swallowing x2days - states was at the beach this past weekend Is Patient Diabetic? No Comments Medications reviewed with patient Daytime contact number verified with patient. Boone Master CNA/MA  November 22, 2009 12:12 PM    Physical Exam  Additional Exam:  WD, WN, 61 y/o WM in NAD... GENERAL:  Alert & oriented; pleasant & cooperative... HEENT:  [place]/AT,  Left EAC w/ redness, no drainage, tender to touch on external auricel, Right EACs-clear, TMs-wnl, NOSE-clear, THROAT- sl red w/o exud... NECK:  Supple w/ full ROM; no JVD; normal carotid impulses w/o  bruits; no thyromegaly or nodules palpated; no lymphadenopathy. CHEST:  Median sternotomy scar-CTA bilaterally  HEART:  Regular Rhythm; without murmurs/ rubs/ or gallops detected... ABDOMEN:  Soft & nontender; normal bowel sounds; no organomegaly or masses palpated... EXT: without deformities, mild arthritic changes; no varicose veins/ +venous insuffic/ no edema.     Impression & Recommendations:  Problem # 1:  OTITIS EXTERNA (ICD-380.10)  Cortisporin otic 4 drops to affected ear three times a day  Claritin once daily as needed  Tylenol as needed pain  Please contact office for sooner follow up if symptoms do not improve or worsen  His updated medication list for this problem includes:    Neomycin-polymyxin-hc 3.5-10000-1 Soln (Neomycin-polymyxin-hc) .Marland KitchenMarland KitchenMarland KitchenMarland Kitchen 4 drops to affected ear three times a day for 7days  Orders: Est. Patient Level III (40981)  Medications Added to Medication List This Visit: 1)  Neomycin-polymyxin-hc 3.5-10000-1 Soln (Neomycin-polymyxin-hc) .... 4 drops to affected ear three times a day for 7days  Complete Medication List: 1)  Claritin 10 Mg Tabs (Loratadine) .... Take 1 tab by mouth once daily as needed for allergy symptoms.Marland KitchenMarland Kitchen 2)  Aspirin Adult Low Strength 81 Mg Tbec (Aspirin) .... Take 1 tablet by mouth once a day 3)  Plavix 75 Mg Tabs (Clopidogrel bisulfate) .... Take 1 tablet by mouth once a day 4)  Crestor 10 Mg Tabs (Rosuvastatin calcium) .... Take 1 tablet by mouth once a day 5)  Tricor 145 Mg Tabs (Fenofibrate) .... Take 1 tablet by mouth once a day 6)  Synthroid 75 Mcg Tabs (Levothyroxine sodium) .... Take 1 tablet by mouth once a day 7)  Flomax 0.4 Mg Xr24h-cap (Tamsulosin hcl) .... Take 2 tablets by mouth once daily 8)  Neomycin-polymyxin-hc 3.5-10000-1 Soln (Neomycin-polymyxin-hc) .... 4 drops to affected ear three times a day for 7days  Patient Instructions: 1)  Cortisporin otic 4 drops to affected ear three times a day  2)  Claritin once daily  as needed  3)  Tylenol as needed pain  4)  Please contact office for sooner follow up if symptoms do not improve or worsen  Prescriptions: NEOMYCIN-POLYMYXIN-HC 3.5-10000-1 SOLN (NEOMYCIN-POLYMYXIN-HC) 4 drops to affected ear three times a day for 7days  #1 x 0   Entered and Authorized by:   Rubye Oaks NP   Signed by:   Rubye Oaks NP on 11/22/2009   Method used:   Electronically to        [facility]. [phone]* (retail)       [address]       [place], [place]  [address]       Ph: [phone]       Fax: [phone]   RxID:   [id number]    Immunization History:  Influenza Immunization History:    Influenza:  historical (02/10/2009)

## 2010-09-25 NOTE — Assessment & Plan Note (Signed)
[facility]                                 ON-CALL NOTE   NAME:LAGREGACalum, Cormier                     MRN:          [id number]  DATE:10/07/2006                            DOB:          [birth year]    PHONE NUMBER:  [phone].   TIME:  7:15 p.m.   ON CALL NOTE:  Mr. Ohair has new onset left lower quadrant pain and he  was evaluated by Dr. Stan Head in the office. I spoke to Dr. Leone Payor  at the end of the day and an abd/pelvic CT scan was ordered for this  evening. The radiologist called me with the report this evening and it  showed inflammatory changes around the sigmoid colon, consistent with  diverticulitis, gallstones, and a slightly distended urinary bladder.  Given his clinical symptoms and CT findings, it appears that he has  acute diverticulitis. I called and spoke to Mr. Dube and asked him to  remain on a low-residue diet for the next 7 to 10 days and then advance  to a high fiber diet with 6 to 8 glasses of water a day for the long  term. He will start Ciprofloxacin 500 mg b.i.d. for 7 days and  metronidazole 500 mg b.i.d. for 7 days for treatment of acute  diverticulitis. He will contact the office if his symptoms do not  improve and I will plan to see him in followup.     Venita Lick. Russella Dar, MD, [facility]  Electronically Signed    MTS/MedQ  DD: 10/07/2006  DT: 10/07/2006  Job #: [id number]   cc:   Iva Boop, MD,FACG

## 2010-09-25 NOTE — Assessment & Plan Note (Signed)
[facility]                         GASTROENTEROLOGY OFFICE NOTE   NAME:Samuel Flowers, Samuel Flowers                     MRN:          [id number]  DATE:10/07/2006                            DOB:          [birth year]    CHIEF COMPLAINT:  Abdominal pain.   HISTORY:  Mr. Kissick is a patient of Dr.  Ardell Isaacs. He developed fairly  severe pain in the left lower quadrant described as a 6/10 two days ago,  to the left above his umbilicus. There is no nausea, vomiting, diarrhea.  He had one loose stool the other day, but he has had no diarrhea. He has  had regular bowel movements. He has eaten without difficulty. No  bleeding and no fever. He has not really had problems like this before.  He does carry a diagnosis of irritable bowel syndrome and has a history  of previous severe C-difficile colitis.   PAST MEDICAL HISTORY:  1. History of severe C-difficile colitis.  2. Irritable bowel syndrome.  3. Allergies.  4. Last colonoscopy April 04, 2003, showing internal hemorrhoids.      Random biopsies negative (diarrhea at that time).  5. Coronary artery disease with prior coronary artery bypass graft in      2002.  6. Prior lumbar laminectomy.  7. Hypertension.  8. History of urinary retention.  9. Benign prostatic hypertrophy.  10.History of sinusitis.  11.Prior tonsillectomy.  12.Dyslipidemia.   MEDICATIONS:  Listed and reviewed in the chart. See that for doses.  He is on:  1. Claritin.  2. Foltx.  3. Flomax.  4. Plavix.  5. Aspirin 81 mg.  6. Tricor.  7. Crestor.   DRUG ALLERGIES:  None known.   PHYSICAL EXAMINATION:  Weight is 191 pounds, temperature 98.1, pulse 66,  blood pressure 110/70, height 5 feet, 8 inches.  EYES: Anicteric.  BACK: No costovertebral angle tenderness.  LUNGS:  Clear.  HEART: S1, S2.  No murmurs, rubs or gallops.  ABDOMEN: Is soft. He has moderate tenderness in the left lower quadrant  and pelvic area without rebound. Bowel  sounds are present. There is no  organomegaly or mass.  LOWER EXTREMITIES: Free of edema.  He is alert and oriented x3.   ASSESSMENT:  Acute left lower quadrant pain x2 days. Based upon the  examination and the history, I am concerned about the possibility of  acute diverticulitis. He has not had diverticulosis evident on previous  imaging, but that does not rule this out. Other possibilities include  some sort of renal disorder, though he has no urinary symptoms, that is  possible. His abdominal wall is not tender-IE-the pain has lessened  significantly with abdominal wall tension. This suggests an intra-  abdominal process.   PLAN:  1. CT of the abdomen and pelvis with IV and oral contrast tonight.  2. Urinalysis, CBC, BMET.  3. Further plans pending that. I deliberately withheld any empiric      antibiotics as there is no fever and he has a history of C-      difficile problems. If he needs antibiotics, would consider the  addition of Florastor to help reduce the likelihood of clostridium      difficile infection again.  4. Further plans pending clinical course and the above results.     Iva Boop, MD,FACG  Electronically Signed    CEG/MedQ  DD: 10/07/2006  DT: 10/07/2006  Job #: [id number]   cc:   Venita Lick. Russella Dar, MD, [facility]  Scott M. Kriste Basque, MD

## 2010-09-25 NOTE — Progress Notes (Signed)
[facility]                        PERIPHERAL VASCULAR OFFICE NOTE   NAME:Samuel Flowers, Samuel Flowers                     MRN:          [id number]  DATE:07/22/2007                            DOB:          1950-10-10    CLINICAL HISTORY:  Samuel Flowers is 60 years old and returned for a follow  up man who has  coronary artery disease.  He had coronary bypass graft  surgery in the fall of 2002 and had catheterization in 2003, which  showed patent grafts but with stenosis in a posterolateral branch which  was not grafted and which was treated medically.  He has done well since  that time.  Has had no recent chest pains, shortness of breath, or  palpitations   PAST MEDICAL HISTORY:  1. Significant for hyperlipidemia.  2. Gastroesophageal reflux disease.  3. Benign prostatic hypertrophy.   CURRENT MEDICATIONS:  1. Include Claritin.  2. Flomax.  3. Plavix.  4. Aspirin.  5. Tricor 145 mg daily.  6. Crestor 10 mg daily.  7. Synthroid 75 mcg daily.   On examination, the blood pressure is 121/79, the pulse 63 and regular.  There was no venous distension.  Carotid pulses were full without  bruits.  CHEST:  Clear.  Cardiac rhythm  was regular.  There were no murmurs or  gallops.  ABDOMEN:  Soft.  Normal bowel sounds.  There is no hepatosplenomegaly.  Peripheral pulses were full.  There is no peripheral edema.   His electrocardiogram showed incomplete right bundle branch block and  was otherwise normal.   IMPRESSION:  1. Coronary artery disease status post coronary artery bypass graft      surgery 2002 with patent graft in 2003 now stable.  2. Hyperlipidemia with some persistent low HDL.   RECOMMENDATIONS:  I think Samuel Flowers is doing well.  I talked about  exercise and low carbohydrate diet for elevating his HDL.  Also talked  to him about weight reduction.  He will work on these things.  We will  plan to see him back in follow up in a year.    Bruce Elvera Lennox  Juanda Chance, MD, [facility]  Electronically Signed   BRB/MedQ  DD: 07/22/2007  DT: 07/23/2007  Job #: [id number]

## 2010-09-28 NOTE — Op Note (Signed)
   NAMEOHN, BOSTIC                        ACCOUNT NO.:  [id number]   MEDICAL RECORD NO.:  [id number]                   PATIENT TYPE:  INP   LOCATION:  0362                                 FACILITY[facility]  [facility]   PHYSICIAN:  Lina Sar, M.D. [facility]               DATE OF BIRTH:  Feb 19, 1951   DATE OF PROCEDURE:  DATE OF DISCHARGE:                                 OPERATIVE REPORT   PROCEDURE:  Flexible sigmoidoscopy.   INDICATIONS:  This 60 year old white male has developed abdominal pain and  diarrhea, passage of mucous, and some fecal incontinence.  This developed  after he took a course of antibiotic for ear infection.  He became  dehydrated, unable to eat, nauseated, and came to the emergency room for  further evaluation.  CT scan showed diffuse thickening of his entire colon  consistent with acute colitis.  He is undergoing flexible sigmoidoscopy to  assess him for pseudomembranous colitis versus ulcerative colitis which the  patient apparently had about 20 years ago.  Radiographic findings were more  consistent with ulcerative colitis.   INSTRUMENT:  Olympus single-channel videoendoscope.   SEDATION:  Versed 5 mg IV, fentanyl 50 mcg IV.   DESCRIPTION OF PROCEDURE:  Olympus single-channel videoendoscope passed  under direct vision through rectum to the sigmoid colon.  The patient was  monitored by pulse oximetry.  His oxygen saturations were normal.  He was  cooperative.  The patient had no prep before the procedure.  Starting in the  rectum there was extensive pseudomembrane formation through the rectum,  sigmoid colon, and the descending colon.  Large whitish exudate was adherent  to the colonic wall.  Mucosa was edematous, but there was no bleeding or  friability.  Multiple biopsies were obtained through the sigmoid and  descending colon.  The procedure was carried out to 60 cm.  Video  photographs were obtained.  The colonoscope was then retracted, colon  decompressed.  The  patient tolerated the procedure well.   IMPRESSION:  Severe pseudomembranous colitis, status post biopsies.   PLAN:  The patient will be continued on bowel rest, Flagyl initially IV at  500 mg q.6h.  We will also add Questran 4 g daily, and we are waiting for  the C. difficile toxin which could be positive.  He also needs a urology  consult because of urinary retention.                                               Lina Sar, M.D. [facility]    DB/MEDQ  D:  12/12/2002  T:  12/12/2002  Job:  [id number]   cc:   [name], M.D. [facility]

## 2010-09-28 NOTE — Discharge Summary (Signed)
[place]. [facility]  Patient:    Samuel Flowers, Samuel Flowers Visit Number: [id number] MRN: [id number]          Service Type: MED Location: [id number] Attending Physician:  Tressie Stalker Dictated by:   Tollie Pizza. Collins, P.A.-C. Admit Date:  02/19/2001 Disc. Date: 02/24/01   CC:         Everardo Beals. Juanda Chance, M.D. [facility]  Scott M. Kriste Basque, M.D. [facility]   Discharge Summary  ADMITTING DIAGNOSES: 1. Three-vessel coronary artery disease. 2. Left main coronary disease. 3. Class IV unstable angina. 4. Hyperlipidemia. 5. History of gastroesophageal reflux. 6. Benign prostatic hypertrophy. 7. History of lumbar laminectomy.  PROCEDURES: 1. Cardiac catheterization. 2. Coronary artery bypass graft x 5 (left internal mammary artery to the left    anterior descending, right internal mammary artery to the posterior    descending artery, saphenous vein graft sequentially to ramus intermedius    and circumflex marginal, saphenous vein graft to first diagonal.  HISTORY OF PRESENT ILLNESS:  The patient is a 60 year old, white male with no previous coronary history.  He has experienced a two-month history of dyspnea on exertion.  Over the past two to three weeks he has also developed chest pain in association with exertion.  On the date of this admission, he was seen at [facility] and was evaluated by Dr. Juanda Chance.  At that time, he was noted to be having some episodes of chest pain at rest while in the office. It was felt that he should be transferred to [facility] for cardiac catheterization and further evaluation and treatment.  HOSPITAL COURSE:  He underwent cardiac catheterization by Dr. Gerri Spore.  He was noted to have left main coronary stenosis as well as severe three-vessel coronary artery disease.  Left ventricular function was well-preserved.  It was recommended that in light of his symptoms of unstable angina, that he proceed with surgical intervention at  this time.  He was seen in consultation by Dr. Cornelius Moras and was taken to the operating room on October 11, where he underwent CABG x 5 with the above-noted grafts.  He tolerated the procedure well and was transferred from the OR to the SICU in stable condition. Postoperatively, he has done well.  He was extubated shortly after surgery and has remained hemodynamically stable throughout the remainder of his admission. He has been afebrile and all vital signs have been stable.  He has been somewhat volume overloaded and has been diuresing well with the addition of Lasix and potassium.  His incisions are healing well.  He is tolerating a regular diet and is having normal bowel and bladder function.  It is felt that if he remains stable, he may be discharged home on February 24, 2001.  DISCHARGE MEDICATIONS: 1. Lopressor 25 mg b.i.d. 2. Altace 1.25 mg q.d. 3. Pravachol 40 mg q.h.s. 4. Lasix 40 mg q.d. x 5 days. 5. K-Dur 20 mEq q.d. x 5 days. 6. Enteric coated aspirin 325 mg q.d. 7. Percocet 5/325 one to two q.4h. p.r.n. pain. 8. Flomax 0.4 mg q.d. 9. Claritin 10 mg q.d.  ACTIVITY:  He is to refrain from driving, lifting anything heavier than 10 pounds or any strenuous activity.  He is asked to continue daily walking and deep breathing exercises.  DIET:  Maintain a low fat, low sodium diet.  WOUND CARE:  He is asked to clean his incisions daily with soap and water.  FOLLOWUP:  He will see Dr. Juanda Chance in  the office in two weeks and have a chest x-ray performed at that time.  He will then follow up with Dr. Cornelius Moras on Monday, November 4, at 9:30 a.m. and he should bring his chest x-ray to this appointment for further evaluation.  SPECIAL INSTRUCTIONS:  He is asked to call our office if he has any problems in the interim.  Discharge instructions have been reviewed with the patient and a handwritten copy will also be sent with him. Dictated by:   Tollie Pizza Collins, P.A.-C. Attending Physician:   Tressie Stalker DD:  02/23/01 TD:  02/23/01 Job: [id number]

## 2010-09-28 NOTE — Cardiovascular Report (Signed)
[place]. [facility]  Patient:    Samuel Flowers, Samuel Flowers Visit Number: [id number] MRN: [id number]          Service Type: MED Location: [phone] Attending Physician:  Lenoria Farrier Dictated by:   Noralyn Pick Eden Emms, M.D. [facility] Admit Date:  08/12/2001 Discharge Date: 08/13/2001   CC:         [name], M.D. [facility]  Bruce R. Juanda Chance, M.D. [facility]   Cardiac Catheterization  INDICATIONS:  Chest pain, status post coronary artery bypass grafting in October of 2002.  DESCRIPTION OF PROCEDURE:  Standard catheterization was done from the right femoral artery.  Left main coronary artery had a 40% discrete stenosis.  Left anterior descending artery had an 80% discrete stenosis in the midvessel. There was significant competitive flow from the LIMA graft.  First diaognal branch had 30% multiple discrete lesions.  Circumflex coronary artery was 100% occluded proximally.  The right coronary artery was subtotally occluded distally.  The PDA was 100% occluded.  There was an 80% discrete stenosis very distally in a small PLA branch.  The saphenuos vein graft to the intermediate branch was widely patent.  The saphenuos vein graft to the obtuse marginal branch was widely patent.  The left internal mammary artery was widely patent to the mid and distal LAD.  The right internal mammary artery was injected nonselectively through the right subclavian.  The patient had somewhat unusual early takeoff of the carotid artery and we could only pass the wire either to the carotid artery or vertebral artery and not up the subclavian.  However, subselective injections showed that the RIMA was widely patent to the PDA.  RAO ventriculography was normal.  Ejection fraction was 60%.  There was no gradient across the aortic valve and no MR.  IMPRESSION:   The patient may have a small area that is suboptimally revacularized in the distal PLA.  However, he has negative enzymes  with atypical chest pain and no EKG changes.  He has a normal LV with no regional wall motion abnormalities and just had a normal Cardiolite study six weeks ago. Given this, I think continued medical therapy is warranted.  I think the procedure to open up the subtotally occluded distal right coronary artery and then dilate that area and distal posterolateral branch would carry more risk than medical therapy.  Unless his enzymes are elevated, we will recommend medical therapy.  Since his pain is atypical, he will have a follow-up chest and abdominal CT scan to rule out other causes of his pain.  The patients pain does have a pleuritic quality and also has significant epigastric pain which radiates up into his chest that reproduces the pain.  He will have the CT scans in the morning and we will not place him on heparin unless his enzymes are positive. Dictated by:   Noralyn Pick Eden Emms, M.D. [facility] Attending Physician:  Lenoria Farrier DD:  08/12/01 TD:  08/13/01 Job: [id number]

## 2010-09-28 NOTE — Assessment & Plan Note (Signed)
[facility]                         GASTROENTEROLOGY OFFICE NOTE   NAME:Samuel Flowers, Samuel Flowers                     MRN:          [id number]  DATE:05/02/2006                            DOB:          [birth year]    PROBLEM:  Diarrhea.   REASON:  Samuel Flowers is a 60 year old white male followed by Dr. Russella Flowers  for dyspepsia and what sounds like IBS. Approximately five or six days  ago, he developed nausea, vomiting and profuse diarrhea. He had lower  abdominal pain as well. This subsided after 48 hours. He went 3 days  without a bowel movement and actually called the office last evening  concerned about not having a spontaneous bowel movement. I instructed  him to do nothing and just to follow him expectantly. In the last 12  hours, he has developed recurrent diarrhea consisting of watery stools.  She is without abdominal pain. He has been on no antibiotics. He does  complain of some abdominal bloating.   MEDICATIONS:  Include:  1. Claritin.  2. Flonase.  3. Plavix.  4. Baby aspirin.  5. TriCor.  6. Crestor.   PHYSICAL EXAMINATION:  On exam, he is a healthy appearing male, pulse  65, blood pressure 120/70, weight 184.  HEENT: EOMI. PERRLA. Sclerae are anicteric.  Conjunctivae are pink.  NECK:  Supple without thyromegaly, adenopathy or carotid bruits.  CHEST:  Clear to auscultation and percussion without adventitious  sounds.  CARDIAC:  Regular rhythm; normal S1 S2.  There are no murmurs, gallops  or rubs.  ABDOMEN:  Bowel sounds are normoactive.  Abdomen is soft, non-tender and  non-distended.  There are no abdominal masses, tenderness, splenic  enlargement or hepatomegaly.  EXTREMITIES:  Full range of motion.  No cyanosis, clubbing or edema.  RECTAL:  Deferred.   IMPRESSION:  Diarrhea. This follows on the heels of what sounds like an  acute gastroenteritis. I think it is unlikely that he has an active  intra-abdominal process. This may be a  variation of his IBS.   RECOMMENDATIONS:  No treatment at this time. I carefully instructed Mr.  Flowers to contact the office after the weekend if his diarrhea worsens  or does not subside.     Samuel Flowers. Samuel Dice, MD,FACG  Electronically Signed    RDK/MedQ  DD: 05/02/2006  DT: 05/03/2006  Job #: [id number]   cc:   Samuel Flowers. Samuel Dar, MD, [facility]

## 2010-09-28 NOTE — Discharge Summary (Signed)
Samuel Flowers, Samuel Flowers                        ACCOUNT NO.:  [id number]   MEDICAL RECORD NO.:  [id number]                   PATIENT TYPE:  INP   LOCATION:  0362                                 FACILITY:  [facility]   PHYSICIAN:  Barbette Hair. Arlyce Dice, M.D. [facility]          DATE OF BIRTH:  08-21-1950   DATE OF ADMISSION:  12/11/2002  DATE OF DISCHARGE:  12/20/2002                                 DISCHARGE SUMMARY   ADMITTING DIAGNOSES:  1. This is a 60 year old male with acute colitis with diffuse pattern on CT     scan, rule out ulcerative colitis, rule out possible pseudomembranous     colitis versus other infectious colitis.  2. Coronary artery disease, status post coronary artery bypass grafting     2002.  3. Status post remote lumbar laminectomy.  4. Hypertension.  5. History of urinary retention with distended bladder on CT.  6. Benign prostatic hypertrophy.   DISCHARGE DIAGNOSES:  1. Resolving pseudomembranous colitis.  2. Hypokalemia, resolved.  3. Probable viral upper respiratory infection.  4. Urinary retention, symptomatically improved.  5. IM studies consistent with chronic disease pattern, etiology unclear.   CONSULTATIONS:  Urology, [facility] Flowers.   PROCEDURES:  1. Flexible sigmoidoscopy with biopsies, [facility].  2. Plain abdominal films.  3. CT abdomen and pelvis done as outpatient prior to admission.   BRIEF HISTORY:  Samuel Flowers is a 60 year old white male who presents with  abdominal pain and diarrhea.  He gives a two-week history of abdominal  discomfort associated with frequent loose and small-volume stools.  He had  noted some hematochezia more recently as well.  The patient had been treated  with an antibiotic for an ear infection about 2-1/2 weeks prior to  admission, the name of which he was uncertain.  He reported that the  diarrhea started soon after the antibiotics had been started.  Remotely, 20  years ago or so he had a history of a nonspecific colitis which  was treated  with steroids under the care of [facility].  He had never had any  recurrence and had not had a colonoscopy during that time.   The patient also has a history of coronary artery disease, is status post  CABG in 2002.  Has had a lumbar laminectomy and has a history of  hypertension.   At the time of admission the patient had been feeling worse over the past  few days with more discomfort in the left abdomen, a decrease in appetite  with poor p.o. intake.  He reports having diarrhea during the daytime and  nighttime hours.  He had been seen by [facility] Flowers nurse practitioner in the  office the day prior to admission, with CT of the abdomen and pelvis  performed that day as well and showing marked diffuse inflammatory changes  of the colon.  He was also noted to have distention of the urinary bladder  and an  umbilical hernia.  He had been started on oral Flagyl.  He presented  to the emergency room on December 11, 2002, with ongoing complaints of pain and  diarrhea which was progressive.  He was seen and evaluated by [facility] and  admitted to the hospital for bowel rest, hydration, and further diagnostic  evaluation.   LABORATORY STUDIES:  On December 11, 2002, WBC of 18.3, hemoglobin 16.1,  hematocrit of 46.3, MCV of 91.7, platelets 283.  Serial values were  obtained.  On December 12, 2002, WBC was 19.5, hemoglobin 16.7, hematocrit of  48.5.  On December 16, 2002, WBC of 13.3, hemoglobin of 13.6.  On December 19, 2002, WBC 10.1, hemoglobin 12.8.  Sedimentation rate on admission, protime  13.6, INR of 1.1.  Electrolytes on admission within normal limits with a  potassium of 4.1, glucose 126, BUN 14, creatinine 1.5, albumin 3.3.  Serial  values were obtained.  On December 16, 2002, potassium was 2.8.  This was  replaced.  On August 7 potassium was 3.6, and on December 19, 2002, potassium  was 3.5.  Liver function studies on admission within normal limits.  Iron on  December 11, 2002, was low at 69.   Ferritin normal at 170.  Follow-up on December 15, 2002, showed a serum iron of 17, TIBC of 175, and iron saturation of 10.  PSA was 0.47.  Prealbumin on December 15, 2002, was 9.5.  Urinalysis on December 11, 2002, was negative.  Follow-up on December 12, 2002, with catheterization  showed 21-50 rbc's, 3-6 wbc's.  Stool for WBC showed a few wbc's present.  Urine culture no growth.  Stool for C. difficile was positive on December 11, 2002.  Stool culture on December 11, 2002, negative.  Repeat C. difficile on  December 12, 2002, was also positive.   X-ray studies:  Plain abdominal films on December 11, 2002, showed a nonspecific  bowel gas pattern.  Low-volume chest x-ray with bibasilar atelectasis.  Urinary bladder markedly distended.  Follow-up on December 13, 2002,  nonspecific abdominal films, with paucity of bowel gas.  No sign of  obstruction or wall thickening.   HOSPITAL COURSE:  The patient was admitted to the service of Samuel Flowers, who was covering on call.  He was initially placed on bowel rest  with sips of water, hydrated with D-5 half normal saline.  A Foley catheter  was placed due to history of urinary retention and distended bladder on CT  and plain films.  He was started empirically on methylamine, covered with IV  Flagyl, given Demerol and Phenergan as needed for complaints of pain and  nausea, covered with IV Protonix.  He did undergo flexible sigmoidoscopy on  December 12, 2002, with [facility].  This showed findings consistent with  pseudomembranous colitis from 0-60 cm.  Biopsies were obtained, and these  also returned consistent with a pseudomembranous colitis.  He was placed on  Questran daily.  He continued to feel quite nauseated and anorexic for the  first few days of hospitalization and was unable to take much p.o.  He was  seen in consultation by urology as well for his urinary retention symptoms.  They advised continuing the Foley catheter to straight drainage.  He was restarted  on his Flomax and suggested that when he was clinically improved  to give him a voiding trial.  He was to follow up with [facility] Flowers as an  outpatient.  On December 13, 2002, he had not made much clinical progress.  White count remained elevated at 19.5, and he was switched from IV Flagyl to  p.o. vancomycin despite his nausea as it was felt that this may be more  effective.  We did encourage him to use the antiemetics and placed him on  Zofran around-the-clock as well as p.r.n. Phenergan.  He did develop  abdominal distention but never manifested a true ileus on abdominal films.  By December 14, 2002, his nausea was decreasing, and he was able to keep the  oral vancomycin down.  With his abdominal distention we did encourage him to  decrease use of pain medication as well in order to avoid an NG tube.  He  made slow progress thereafter, with gradual resolution of his leukocytosis.  By December 18, 2002, he was taking liquids without difficulty but still having  quite a bit of diarrhea and had had approximately 12 bowel movements over a  24-hour period.  By December 16, 2002, he was making more improvement, with  decrease in stooling.  We were thereafter able to discontinue his morphine  PCA which he was not using much, began gradually advancing his diet to thick  liquids and then low residue, which he did tolerate though with still some  complaints of bloating.  He did not have any further nausea and vomiting.  He was able to ambulate, remained afebrile, and his white count by December 19, 2002, had normalized to 10.1.  On December 20, 2002, he was felt stable for  discharge.  That day he did complain of some cold symptoms with a mild  sore throat and cough which had been productive of some clear white sputum.  He had not had any fever, had no complaints of dysphagia or odynophagia.  Did have some sinus drainage complaints as well.  His upper respiratory  symptoms were discussed with Dr. Kriste Basque, his primary,  who suggested treating  him symptomatic initially with mucolytic and cough suppressant.  He was  asked to take Robitussin-DM 10 mL q.4-5h. p.r.n. and to call Dr. Kriste Basque for  fever or worsening of his respiratory symptoms.  He was asked to maintain a  low-residue, no-lactose diet, to follow up with Dr. Russella Dar in the office on  December 28, 2002, at 3:30 p.m., and to call for any problems in the interim.  He was asked to make a follow-up with [facility] Flowers in two to three weeks.   MEDICATIONS ON DISCHARGE:  1. Vancomycin 125 mg p.o. q.i.d., with plans to complete a three-week course     total.  2. Questran 4 g daily in juice or water.  3. He was given samples of Flora-Q, a probiotic supplement, for two weeks.  4. He was to continue his Plavix 75 daily as previously.  5. Toprol-XL as previously.  6. Aspirin 81 mg daily as previously.  7. Zocor, Tricor, and Prevacid all as previously.  8. Flomax 0.8 daily.   CONDITION ON DISCHARGE:  Stable and improved.    Samuel Flowers, P.A.-C. [facility]                Robert D. Arlyce Dice, M.D. [facility]    AE/MEDQ  D:  12/24/2002  T:  12/25/2002  Job:  [id number]   cc:   [name], M.D. [facility]

## 2010-09-28 NOTE — H&P (Signed)
[place]  Patient:    Samuel Flowers, Samuel Flowers Visit Number: [id number] MRN: [id number]          Service Type: MED Location: [phone] Attending Physician:  Samuel Flowers Dictated by:   Samuel Flowers, M.D. F.A.C.C. [facility] Admit Date:  08/12/2001 Discharge Date: 08/13/2001                           History and Physical  DATE OF BIRTH:  07-12-1950  REASON FOR ADMISSION:  Chest pain, rule out MI.  CARDIOLOGIST:  Samuel Beals. Juanda Flowers, M.D.  PRIMARY PHYSICIAN:  Samuel Cloud. Kriste Flowers, M.D.  HISTORY OF PRESENT ILLNESS:  Samuel Flowers is a pleasant 60 year old patient of Dr. Juanda Flowers.  He had bypass surgery in October 2002 which included a LIMA to the LAD, a RIMA to the PDA, sequential vein graft to the OM ramus, and a vein graft to the diagonal.  He has done well since that time.  He had some vague fatigue and lethargy in February.  At that time he had a stress Cardiolite study which was nonischemic with a normal EF.  This morning he awoke with substernal chest pain.  It has some atypical features in that it is somewhat inspiratory and pleuritic in nature.  There is sharp pain but is has not been totally relieved with nitro.  In ER he had a sublingual nitroglycerin and his blood pressure dropped to 80/palpable.  He is getting a fluid bolus at this time.  REVIEW OF SYSTEMS:  Otherwise unremarkable.  He does have hypercholesterolemia and is on Pravachol.  ALLERGIES:  No known drug allergies.  MEDICATIONS: 1. Toprol-XL 25 mg a day. 2. Plavix 75 mg a day. 3. Aspirin q.d. 4. Claritin for seasonal allergies - which are active at this point. 5. Flomax 0.4 mg a day. 6. Pravachol 40 a day.  SOCIAL HISTORY:  He is married and lives in [place], with two children. Does not smoke.  PHYSICAL EXAMINATION:  VITAL SIGNS:  Blood pressure 96/70, pulse 56 and regular.  LUNGS:  Clear.  NECK:  Carotids are normal.  HEART:  There is an S1, S2 without murmur,  rub, gallop, or click.  ABDOMEN:  He is somewhat tender to palpation in the left upper quadrant and epigastric area.  This somewhat reproduces the pain up into his sternum and heart area.  EXTREMITIES:  Distal pulses are intact with no edema.  LABORATORY DATA:  EKG shows sinus rhythm with no acute changes.  Labs are pending.  Chest x-ray shows only basilar atelectasis.  IMPRESSION:  We had a long discussion with the patient and his wife.  I think that particularly since he had a negative Cardiolite study six weeks ago, our best bet to totally rule out cardiac disease is to proceed with heart catheterization.  Occasionally there can be early restenosis of a RIMA graft in particular.  However, to me the patients pain is somewhat atypical - being both pleuritic and somewhat reproducible in the epigastric area.  If his heart catheterization does not show any problems with his grafts, I think he should have a chest and abdominal CT and further workup.  There is really no high evidence for PE; although he has pleuritic pain his chest x-ray only shows a little bit of atelectasis.  He is not dyspneic at rest but we will check a D-dimer and proceed with catheterization later today. Dictated by:  Samuel Pick. Eden Flowers, M.D. F.A.C.C. [facility] Attending Physician:  Samuel Flowers DD:  08/12/01 TD:  08/12/01 Job: [id number]

## 2010-09-28 NOTE — H&P (Signed)
[name], Flowers                        ACCOUNT NO.:  [id number]   MEDICAL RECORD NO.:  [id number]                   PATIENT TYPE:  INP   LOCATION:  0362                                 FACILITY:  [facility]   PHYSICIAN:  Lina Sar, M.D. [facility]               DATE OF BIRTH:  1950/05/25   DATE OF ADMISSION:  12/11/2002  DATE OF DISCHARGE:                                HISTORY & PHYSICAL   REASON FOR ADMISSION:  Abdominal pain with diarrhea.   HISTORY OF PRESENT ILLNESS:  Mr. Samuel Flowers is a 60 year old white male who, or  approximately two weeks, has been having abdominal pain associated with  frequent loose but small-volume stools.  There has been some bleeding  associated with bowel movements more recently.  About two and one-half weeks  ago, he was treated with an oral antibiotic for an ear infection.  He does  not know the name of this antibiotic, and his pharmacy is presently closed,  so we cannot specify what it was he was taking.  The diarrhea, however,  started soon after the antibiotics started.  He also has a remote, 20 years  ago or more, history of colitis at which time he was treated with steroids  under the supervision of Dr. Eloise Harman.  He never was on any methylamine type  of preparations or chronic medications as far as he can rectal.  He had a  screening colonoscopy about six years ago, possibly by Dr. Russella Dar or by Dr.  Eloise Harman, that was normal per his history.  His father does have a history  of colon polyps.   Associated with the change in bowel habits was pain in the lower abdomen,  worse on the left side.  No fevers or chills.  Initially, appetite was  preserved, but in the last several days he has been anorexic and not eating  as much.  Diarrhea occurs at night.  He was in touch with the GI office who  advised him to use fiber supplements of Citrucel.  They also prescribed some  Rubinol.  He did not have any stool studies.  GI was unable to see him in  the  office this week after he had returned from vacation.  He, therefore,  went to see Dr. Jodelle Green nurse practitioner late this past week. She  prescribed Prevacid, but she also ordered a CT scan.  The CT was performed  on July 20 and showed some fatty liver and some marked inflammation in the  throughout the colon.  Marked distention of the urinary bladder was also  noted.  An umbilical hernia was noted.   The patient presented to the emergency room on July 31 with ongoing  complaints of pain and significant loose stools.  Dr. Juanda Chance evaluated him  and was concerned that perhaps he had a history of ulcerative colitis and  was flaring.  There was also concern that he  had C. difficile colitis.  She  admitted him for bowel rest and started him on oral methylamine and IV  Flagyl in addition to Demerol and Phenergan.  She ordered multiple stool  studies.   This morning, she performed a flexible sigmoidoscopy which clarified  matters.  This study confirmed severe pseudomembranous colitis in the  portion of the colon visualized which was up to 60 cm.  Therefore, she  discontinued methylamine, added Questran, and will also be changing him to  oral Flagyl.  The patient does describe some nausea, but he has not thrown  up.  He describes some urinary retention associated with his having run out  of Flomax in the  last couple of days, and a Foley catheter has been placed.  Residual urine was 1300 ml.   An acute abdominal series was obtained when he came to the emergency room on  July 31 and again confirmed marked distention of the urinary bladder,  worrisome for bladder outlet obstruction, but the bowel gas pattern itself  was nonspecific.   PAST MEDICAL HISTORY:  1. Coronary artery disease, status post CABG in 2002.  2. Status post lumbar laminectomy 15 years ago.  3. Fatty liver.  4. Hypertension.  5. Benign prostatic hypertrophy.  6. History of nonspecific colitis.   ALLERGIES:  LACTOSE  intolerance.   MEDICATIONS:  1. Plavix 75 mg p.o. daily.  2. Zocor 80 mg p.o. daily.  3. Toprol XL 25 mg p.o. daily.  4. Aspirin 81 mg p.o. daily.  5. Prevacid 30 mg p.o. daily, very recently started.   Note:  This list may be incomplete, as the wife has the list of his  medications, and we do not have that specific list in our possession yet.   SOCIAL HISTORY:  The patient drinks occasionally, about three glasses of  wine a week.  He does not smoke and has never smoked.  He works in  Insurance account manager, and he and his wife live in [place].  They have two college-  age children.   FAMILY HISTORY:  His father has a history of colon polyps.  His paternal  aunt has a history of colon cancer.  Both parents have a history of coronary  artery disease.  No history of renal disease.  No family history of strokes.  No family history of colitis or inflammatory bowel disease.   PHYSICAL EXAMINATION:  VITAL SIGNS:  On arrival, temperature 100.6, heart  rate 82, respirations 20< blood pressure 128/72.  Weight 170 pounds  estimated.  GENERAL:  Alert, oriented, and in some distress and discomfort.  HEENT:  Sclerae nonicteric.  Conjunctivae pink.  Extraocular movements  intact.  Oropharynx and mucous membranes dry.  No lesions or exudates.  CHEST:  Clear to auscultation and percussion bilaterally.  No cough.  COR:  This is audible S1 and S2.  Rhythm is rapid but regular.  No murmurs,  rubs, or gallops.  ABDOMEN:  Distended with hypoactive bowel sounds.  He is very tender,  especially in the lower abdomen but is tender throughout the abdomen.  Questionable rebound in the left lower abdomen and mid abdomen.  No masses  or hepatosplenomegaly appreciated.  RECTAL:  Stool is yellowish, liquid, and guaiac positive.  EXTREMITIES:  No cyanosis, clubbing, or edema.  DERMATOLOGIC:  No lesions on the extremities or trunk.  No rashes. HEMATOLOGIC:  No bruises evident.  No arteriovenous malformations.   NEUROLOGIC:  No tremor.  Grip and pedal strength 5/5 bilaterally.  Alert  and  oriented x 3.   LABORATORY DATA:  White count 18,300, hemoglobin 16.1, hematocrit 46.3, MCV  91.7, platelets 283,000.  On differential, there is increase in neutrophils  and diminished lymphocytes.  Sed rate is normal at 11.  PT 13.6, INR 1.1,  PTT 33.  BMET: Sodium 137, potassium 4.1, glucose 126, BUN 14, creatinine  1.5, total bilirubin 0.8, alkaline phosphatase 55, AST 18, ALT 21, albumin  3.3.  Urinalysis shows a large amount of blood, 30 mg protein, rare squamous  epithelial cells, 3 to 6 white blood cells, 21 to 50 red blood cells per  high power field, few bacteria present.  This is, I believe, on a  catheterized urine specimen.   IMPRESSION:  1. Clostridium difficile colitis in patient who was treated with an     unspecified antibiotic more than two weeks ago for an ear infection.  2. Remote history of colitis with no current evidence for inflammatory bowel     disease.  Will need to look at the records and try to find out what     specific type of colitis that may have been back then.  3. Benign prostatic hypertrophy with acute bladder outlet obstruction.  This     is associated with probably the colitis as well as recent sudden     discontinuation of Flomax because he ran out of this medication.     Urinalysis is not strongly suggestive of urinary tract infection.  4. History of coronary artery disease with no symptoms to suggest that he     has recurrent critical stenosis.   PLAN:  The patient is to be treated with ongoing bowel rest, IV fluids, p.o.  Flagyl, and analgesics and antiemetics as needed.  Questran has also been  added to his medical regimen.  A Foley catheter has been placed, and we will  leave this in place for the time being, but eventually this will possibly be  pulled.  We are restarting the Flomax.     Jennye Moccasin, P.A. [facility]                   Lina Sar, M.D. [facility]     SG/MEDQ  D:  12/12/2002  T:  12/12/2002  Job:  [id number]

## 2010-09-28 NOTE — Discharge Summary (Signed)
[place]. [facility]  Patient:    Samuel Flowers, Samuel Flowers Visit Number: [id number] MRN: [id number]          Service Type: MED Location: [phone] Attending Physician:  Lenoria Farrier Dictated by:   Lavella Hammock, P.A. Admit Date:  08/12/2001 Discharge Date: 08/13/2001   CC:         Samuel Cloud. Kriste Basque, M.D. [facility]   Referring Physician Discharge [place]  DATE OF BIRTH:  02/07/1951  PROCEDURES: 1. Cardiac catheterization. 2. Coronary arteriogram. 3. Left ventriculogram. 4. Graft angiogram. 5. CT of the chest and abdomen with contrast.  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  He had a stress Cardiolite in February 2003 for some fatigue and lethargy which was nonischemic with a normal EF.  On the day of admission he had substernal chest pain that was pleuritic in nature and increased with deep inspiration.  It was a sharp pain and was not totally relieved by nitroglycerin.  He had hypotension with the nitroglycerin and therefore this was discontinued.  He was admitted to rule out MI and for further evaluation.  He had a cardiac catheterization on August 12, 2001 which showed a left main with a 40% stenosis and an LAD with an 80% mid stenosis with the first diagonal at 30% stenosis.  The circumflex was totaled proximally.  The RCA was subtotaled distally with the PDA being totaled and the PLA with an 80% stenosis.  SVG to IM was normal and SVG to OM was normal as well.  LIMA to diagonal was normal as well.  The RIMA was patent to the PDA and his LV was normal with an EF of 65%.  The films from the prior catheterization and this catheterization were reviewed by Dr. Juanda Chance and he felt that the only vessel that was not bypassed was the PLA at 80% and this was unchanged from prior films.  He had a chest CT done which showed resolution of effusions seen on earlier CT and no adenopathy,  as well as no PE and no dissection.  The patient was ambulating and had some pain only with deep inspiration, but because of the shallowness of his inspirations, his oxygen saturation was dropping into 80% range at times.  The patient was advised that he needed to continue with incentive spirometry and increase the depth of his inspirations. The patient describes a history of a hiatal hernia and therefore was started on Nexium.  Additionally he was advised that he could use Tylenol for pain control, although it was not recommended that he be placed on any additional NSAIDS at this time since he is already on aspirin and Plavix.  The patient was ambulating without difficulty and considered stable for discharge on August 13, 2001.  LABORATORY DATA:  Chest x-ray:  Stable cardiomegaly with no active disease and minimal bibasilar atelectasis.  Laboratory values:  Hemoglobin 14.4, hematocrit 41.4, wbcs 4.9, platelets 222.  Sodium 140, potassium 4.4, chloride 105, CO2 29, BUN 8, creatinine 1.1, glucose 103.  LFTs within normal limits.  DISCHARGE CONDITION:  Stable.  DISCHARGE DIAGNOSES: 1. Chest pain, medical therapy recommended at this time. 2. 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 3. Preserved left ventricular function. 4. CT negative for pulmonary embolus/dissection/adenopathy and effusions at    this admission. 5. Hyperlipidemia. 6. Seasonal allergies. 7. Gastroesophageal reflux disease symptoms. 8. Benign prostatic hypertrophy.  DISCHARGE INSTRUCTIONS:  ACTIVITY:  His activity level is to include no driving, sexual, or strenuous activity for two days.  DIET:  He is to stick to a low fat diet.  WOUND CARE:  He is to call the  office for problems with the catheterization site.  FOLLOW-UP:  He is to follow up with Dr. Juanda Chance on Sep 29, 2001 at 2:30 p.m. He is to follow up with Dr. Kriste Basque as needed.  DISCHARGE MEDICATIONS: 1. Toprol-XL 25 mg q.d. 2. Plavix 75 mg q.d. 3. Aspirin 325 mg q.d. 4. Claritin p.r.n. 5. Flomax 0.4 mg q.d. 6. Pravachol 40 mg q.d. 7. Foltx q.d. 8. Nexium 40 mg q.d. - which is new. Dictated by:   Lavella Hammock, P.A. Attending Physician:  Lenoria Farrier DD:  08/13/01 TD:  08/13/01 Job: [id number]

## 2010-09-28 NOTE — Assessment & Plan Note (Signed)
[facility]                             PULMONARY OFFICE NOTE   NAME:LAGREGADemitrios, Molyneux                     MRN:          [id number]  DATE:07/03/2006                            DOB:          [birth year]    HISTORY OF PRESENT ILLNESS:  The patient is a 60 year old white male  patient of Dr. Jodelle Green who has a known history of coronary artery  disease, hyperlipidemia and gastroesophageal reflux who presents for an  acute office visit. The patient complains of a one-day history of sore  throat, fever, chills, body aches and dry cough. The patient recently  was on vacation in [place] and states that his wife developed the flu  and had a positive nasal swab. The patient complains that he is afraid  he is developing the flu. The patient denies any hemoptysis, chest pain,  orthopnea, PND, or leg swelling. The patient is not using any medication  or other treatment.   PAST MEDICAL HISTORY:  Reviewed.   CURRENT MEDICATIONS:  Reviewed.   PHYSICAL EXAMINATION:  GENERAL:  The patient is a pleasant male in no  acute distress.  VITAL SIGNS:  He is afebrile with stable vital signs.  HEENT:  Unremarkable.  NECK:  Supple without adenopathy. No JVD.  LUNGS:  Lung sounds are clear.  CARDIAC:  Regular rate.  ABDOMEN:  Soft, nontender.  EXTREMITIES:  Warm without any edema.  SKIN:  Warm without rash.   IMPRESSION/PLAN:  Acute upper respiratory infection that may be early  influenza with recent exposure. The patient is to begin Tamiflu 75  b.i.d. x5 days, Mucinex DM twice a day. May use Tussionex as needed for  cough. The patient is to return back with Dr. Kriste Basque in one month or  sooner if needed.      Rubye Oaks, NP  Electronically Signed      [name], MD  Electronically Signed   TP/MedQ  DD: 07/04/2006  DT: 07/04/2006  Job #: [id number]

## 2010-09-28 NOTE — Consult Note (Signed)
[place]. [facility]  Patient:    Samuel Flowers, Samuel Flowers Visit Number: [id number] MRN: [id number]          Service Type: MED Location: [id number] Attending Physician:  Tressie Stalker Dictated by:   Salvatore Decent. Cornelius Moras, M.D. Proc. Date: 02/19/01 Admit Date:  02/19/2001   CC:         Daisey Must, M.D. [facility] M. Kriste Basque, M.D. [facility]  Bruce R. Juanda Chance, M.D. [facility]  CVTS office   Consultation Report  REQUESTING PHYSICIAN:  Daisey Must, M.D.  PRIMARY CARE PHYSICIAN:  [name], M.D.  REASON FOR CONSULTATION:  Left main disease, three vessel disease with normal LV function and class 1 unstable angina.  HISTORY OF PRESENT ILLNESS:  The patient is a 60 year old white male from [place] who is at present working for [employer] and has no previous cardiac history.  Two months ago, he began to complain of increasing symptoms of dyspnea on exertion.  Approximately two or three weeks ago, he started to develop exertional chest pain consistent with symptoms of angina.  He was evaluated in the office earlier today at [facility], where he saw Dr. Charlies Constable and Mr. Dian Queen.  He was noted to have some symptoms of chest pain at rest in the office, and was subsequently transferred for catheterization today.  This was performed by Dr. Gerri Spore.  Cath findings include mild distal left main stenosis with severe three vessel coronary artery disease and normal left ventricular function.  Cardiac surgical consultation was requested.  REVIEW OF SYSTEMS:  CARDIAC:  Notable for no previous symptoms of chest pain or exertional shortness of breath other than that described with his current illness.  The patient has had no symptoms of PND, orthopnea, or lower extremity edema.  He has had a few mild dizzy spells, but denies any syncopal episodes, or symptoms of palpitations.  He has had no lower extremity edema. GENERAL:  The patient has otherwise been  well and reports good appetite with no recent change in constitutional symptoms or weight loss.  RESPIRATORY: Notable for some problems with chronic allergies.  He initially was given a prescription for presumed upper respiratory infection and allergies a month ago for his symptoms of shortness of breath.  He did not complete his course of steroids.  He has had no productive cough and denies symptoms of hemoptysis.  He has had no wheezing.  GASTROINTESTINAL:  Notable for symptoms of reflux which he states have increased over the last two or three months. He has more distant history of colitis approximately one year ago.  He has no recent history of hematochezia, hematemesis, or melena.  He does not have problems with constipation or diarrhea.  NEUROLOGIC:  Negative. MUSCULOSKELETAL:  Notable only for occasional cramps in both legs at night. He denies any symptoms of arthritis.  He does have symptoms of urinary frequency and problems with difficulty emptying his bladder for which he takes Flomax chronically.  INFECTIOUS:  Negative.  He had no recent fever or chills. HEMATOLOGIC:  Negative for bleeding diaphysis or history of frequent epistaxis.  ENDOCRINE:  Negative.  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  PERIPHERAL VASCULAR:  Negative.  He denies symptoms of claudication.  HEENT:  Negative.  PAST MEDICAL HISTORY:  Notable for symptoms of GE reflux disease as well as history of hemorrhoids.  He has had umbilical hernia in  the past.  He has history of bladder dysfunction attributed to benign prostatic disease.  PAST SURGICAL HISTORY:  Notable for a lumbar laminectomy in the past.  FAMILY HISTORY:  Notable for the essence of early onset coronary artery disease.  SOCIAL HISTORY:  The patient lives with his wife and has two children, one of whom still lives at home.  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  He exercises regularly.  He is a nonsmoker.  He denies excessive alcohol consumption.  MEDICATIONS PRIOR TO ADMISSION:  Pravachol 40 mg once daily, baby aspirin three tablets given today at Dr. Delia Chimes office.  He was not on that from previously.  Flomax he takes one tablet daily.  ALLERGIES:  No known drug allergies or sensitivities.  PHYSICAL EXAMINATION:  GENERAL:  Notable for a well-appearing white male who appears his stated age in no acute distress.  VITAL SIGNS:  Blood pressure is 110/50.  He is normal sinus rhythm with heart rate in 50s to 60s by monitor.  He is afebrile.  HEENT EXAMINATION:  Grossly within normal limits.  NECK:  Supple.  There is no cervical or supraclavicular lymphadenopathy. There is no jugular venous distention.  CHEST:  Auscultation revealed clear and symmetrical breath sounds bilaterally. No wheezes or rhonchi are noted.  CARDIOVASCULAR EXAMINATION:  Demonstrates regular rate and rhythm.  No murmurs, rubs, or gallops are appreciated.  ABDOMEN:  Soft and nontender.  There are no palpable masses.  The liver edge is not enlarged.  EXTREMITIES:  Are warm and well-perfused.  Distal pulses are easily palpable in all four extremities.  On examination of the left hand, there appears to be sluggish capillary refill with compression of the radial pulse, suggestive of incomplete palmar arch circulation.  The ulnar pulse is somewhat thready. There is no lower extremity edema.  GU EXAMINATION:  Deferred.  NEUROLOGIC EXAMINATION:  Grossly nonfocal and symmetrical throughout.  The remainder of his physical exam is unrevealing.  DIAGNOSTIC TESTS:  Cardiac catheterization films performed today are reviewed.  These demonstrate 70% distal stenosis of the left main coronary artery extending into the ostium of the left anterior descending and left circumflex vessels as well as the large ramus intermediate branch.  There is an 80% proximal  stenosis of the left anterior descending coronary artery with 80% stenosis of the midportion of this vessel after takeoff of a large first diagonal branch.  There is 95% stenosis of the left anterior descending coronary artery at the apex of the heart.  There is 80-90% proximal stenosis of the left circumflex coronary artery.  There is right dominant coronary circulation.  There is subtotal occlusion of the distal right coronary artery at the takeoff of the posterior descending coronary artery.  There is 80% stenosis of the midportion of the posterior descending coronary artery.  Left ventricular function appears normal with an ejection fraction of greater than 55%.  There is no mitral regurgitation.  IMPRESSION:  Left main disease, three vessel disease with class 4 unstable angina, and normal left ventricular function.  PLAN:  We tentatively plan to proceed with coronary artery bypass grafting first case tomorrow with bilateral internal mammary artery graft.  I have outlined at length the indications and potential benefits of coronary artery bypass grafting with the patient and his wife.  They understand and accept all associated risks of surgery including, but not limited to risks of death, stroke, myocardial infarction, bleeding requiring blood transfusion, arrhythmia, infection, and recurrent  coronary artery disease.  All of their questions have been addressed. Dictated by:   Salvatore Decent Cornelius Moras, M.D. Attending Physician:  Tressie Stalker DD:  02/19/01 TD:  02/20/01 Job: [id number]

## 2010-09-28 NOTE — Op Note (Signed)
[place]. [facility]  Patient:    Samuel Flowers, Samuel Flowers Visit Number: [id number] MRN: [id number]          Service Type: MED Location: 2300 [id number] Attending Physician:  Tressie Stalker Dictated by:   Salvatore Decent. Cornelius Moras, M.D. Proc. Date: 02/20/01 Admit Date:  02/19/2001   CC:         Daisey Must, M.D. [facility]  [place]ruce R. Juanda Chance, M.D. [facility]  Scott M. Kriste Basque, M.D. [facility]  CVTS office   Operative Report  PREOPERATIVE DIAGNOSIS:  Left main disease, three vessel coronary artery disease, class IV unstable angina.  POSTOPERATIVE DIAGNOSIS:  Left main disease, three vessel coronary artery disease, class IV unstable angina.  PROCEDURE:  Median sternotomy for coronary artery bypass grafting x 5 (left internal mammary artery to distal left anterior descending coronary artery, right internal mammary artery to posterior descending coronary artery, saphenous vein graft to first diagonal branch, saphenous vein graft to ramus intermediate branch, and sequential saphenous vein graft to circumflex marginal branch).  SURGEON:  Salvatore Decent. Cornelius Moras, M.D.  ASSISTANT:  Alleen Borne, M.D.  SECOND ASSISTANT:  Dominica Severin, P.A.  ANESTHESIA:  General.  INDICATIONS FOR PROCEDURE:  The patient is a 60 year old gentleman from [place] with no previous cardiac history who is followed by Dr. Alroy Dust with hypercholesterolemia.  He is otherwise healthy.  He presents with recent onset of exertional angina which has progressed over the recent weeks.  He developed chest pain occurring at rest on February 19, 2001, at the time that he was being evaluated by Dr. Charlies Constable and associates at [facility].  He was brought for cardiac catheterization that day, which was performed by Dr. Gerri Spore.  This demonstrates left main disease with severe three vessel coronary artery disease and normal left ventricular function.  A full consultation note has been dictated previously.  The  patient and his wife have provided informed consent, and desire to proceed with surgery as described.  DESCRIPTION OF PROCEDURE:  The patient is brought to the operating room on the above mentioned date, and invasive hemodynamic monitoring was established by the anesthesia service under the care and direction of Dr. Arta Bruce.  The patient is placed in the supine position on the operating room table. Intravenous antibiotics are administered.  Following the induction of general endotracheal anesthesia, the patients chest, abdomen, both groins, and both lower extremities are prepped and draped in a sterile manner.  A median sternotomy incision is performed, and the left internal mammary artery is dissected from the chest wall and prepared for bypass grafting.  The left internal mammary artery is a notably good quality conduit.  Subsequently, the right internal mammary artery is also dissected from the chest wall and prepared for bypass grafting.  This to, is felt to be a good quality conduit. Simultaneously, the saphenous vein is obtained from the patients right lower leg through a longitudinal incision.  The saphenous vein is felt to be a good quality conduit.  The patient is heparinized systemically.  The pericardium is opened.  The ascending aorta is normal in appearance.  The ascending aorta and the right atrium are cannulated for cardiopulmonary bypass.  Adequate heparinization is verified.  Cardiopulmonary bypass is begun, and the surface of the heart is inspected. The left ventricle is normal in appearance.  Distal sites are selected for coronary artery bypass grafting.  Portions of the saphenous vein and both internal mammary arteries are trimmed to appropriate lengths.  A temperature  probe is placed in the left ventricular septum, then a styrofoam pad is placed to protect the left phrenic nerve from thermal injury.  A Cardioplegic catheter is placed in the ascending  aorta.  The patient is cooled to 32 degrees systemic temperature.  The aortic cross clamp is applied, and Cardioplegia is delivered in an antegrade fashion through the aortic root.  Ice saline slush is applied for topical hypothermia. The initial cardioplegic arrest and myocardial cooling are felt to be excellent.  Repeat doses of cardioplegia are administered intermittently throughout the cross clamp portion of the operation, both through the aortic root and down the subsequently placed vein grafts to maintain septal temperature below 15 degrees Centigrade.  The following distal coronary anastomoses are performed:  The ramus intermediate branch is grafted with a saphenous vein graft in a side-to-side fashion.  This coronary measures 2 mm in diameter and is of good quality at the site of distal bypass.  The circumflex marginal branch is grafted using the sequential vein graft off of the vein placed to the ramus intermediate branch.  This coronary measures 1.5 mm at the site of distal bypass, and is of good quality.  The diagonal branch off the left anterior descending artery is grafted with a saphenous vein graft in an end-to-side fashion.  This coronary measures 1.7 mm at the site of distal bypass, and is of good quality.  It is diffusely diseased proximal to this area though, and a 1.5 mm probe will not pass retrograde, but it does pass easily antegrade through the distal portion of the vessel.  The posterior descending coronary artery is grafted with a right internal mammary artery in an end-to-side fashion.  The distal anastomosis is placed fairly far down the posterior wall of the left ventricle due to a high grade stenosis in the mid portion of this vessel.  A 1.5 mm probe will pass down the remaining portion  of the posterior descending coronary artery, but will not pass retrograde.  A 1.0 mm probe will pass retrograde through the stenosis in the mid portion of this vessel.   Fortunately, the right internal mammary artery is noted to be very long, and can still be utilized as an in situ graft despite the level of the distal anastomosis.  The distal left anterior descending artery is grafted with a left internal mammary artery in an end-to-side fashion.  This coronary measures 1.7 mm at the site of distal bypass, and is of good quality.  The septal temperature is noted to rise rapidly and dramatically upon reperfusion of both internal mammary arteries.  The heart begins to beat spontaneously. The aortic cross clamp is removed after a total cross clamp time of 71 minutes.  Both proximal saphenous vein anastomoses are performed directly to the ascending aorta under a separate partial occlusion clamp.  All proximal and distal anastomoses are inspected for hemostasis and appropriate graft orientation.  Epicardial pacing wires are affixed to the right ventricular outflow tract into the right atrial appendage.  The patient is rewarmed to greater than 37 degrees Centigrade temperature.  The patient is weaned from the cardiopulmonary bypass without difficulty.  The patients rhythm at separation from bypass is normal sinus rhythm.  No inotropic support is required.  Total cardiopulmonary and bypass time for the operation is 111 minutes.  The venous and arterial cannulae are removed uneventfully. Protamine is administered to reverse the anticoagulation.  The mediastinum at both right and left pleural spaces are irrigated with saline  solution containing vancomycin.  Meticulous surgical hemostasis is ascertained.  The mediastinum and both right and left pleural spaces are drained with four chest tubes placed through separate stab incisions inferiorly.  The median sternotomy is closed in routine fashion.  The right lower extremity incision is closed in multiple areas in routine fashion.  All skin incisions are closed with subcuticular skin closures.  The patient tolerated  the procedure well, and he is transported to the surgical intensive care unit in stable condition.  There are no intraoperative complications.  All sponge, needle, and instrument counts are verified correct at the completion of the operation.  No blood products were administered. Dictated by:   Salvatore Decent Cornelius Moras, M.D. Attending Physician:  Tressie Stalker DD:  02/20/01 TD:  02/21/01 Job: [id number]

## 2010-09-28 NOTE — Assessment & Plan Note (Signed)
[facility]                                 ON-CALL NOTE   SARTAJ, HOSKIN                       MRN:          [id number]  DATE:05/01/2006                            DOB:          08-22-1950    Patient of Dr. Russella Dar.   Mr. Groh called tonight stating he has not had a bowel movement in 3  days. Approximately 4 days ago, he had what sounds like a  gastroenteritis characterized by nausea, vomiting and then perfuse  diarrhea. This subsided 2-1/2 days ago and he has not had a bowel  movement since that time. He no longer has nausea or fever.   I instructed Mr. Hiebert not to take any laxatives but to resume a  normal diet. I doubt any further medicines are necessary.     Barbette Hair. Arlyce Dice, MD,FACG  Electronically Signed    RDK/MedQ  DD: 05/01/2006  DT: 05/02/2006  Job #: [id number]

## 2010-09-28 NOTE — Cardiovascular Report (Signed)
[facility]  Patient:    Samuel Flowers, Samuel Flowers Visit Number: [id number] MRN: [id number]          Service Type: MED Location: [facility] 2930 01 Attending Physician:  Miguel Aschoff Dictated by:   Daisey Must, M.D. [facility] Proc. Date: 02/19/01 Admit Date:  02/19/2001   CC:         [name], M.D. [facility]             Bruce R. Juanda Chance, M.D. [facility]             Cardiac Catheterization Laboratory                        Cardiac Catheterization  PROCEDURES PERFORMED: Left heart catheterization with coronary angiography and left ventriculography, and abdominal aortography.  INDICATIONS: The patient is a 60 year old male, seen in the office earlier today with unstable angina.  He was sent directly to the emergency room and referred for cardiac catheterization.  DESCRIPTION OF PROCEDURE: A 6 French sheath was placed in the right femoral artery.  Standard Judkins 6 French catheters were utilized.  Contrast was Omnipaque. At the conclusion of the procedure, a Perclose vascular closure device was placed in the right femoral artery with good hemostasis.  There were no complications.  RESULTS:  HEMODYNAMICS: Left ventricular pressure 106/22, aortic pressure 106/70.  There was no aortic valve gradient.  LEFT VENTRICULOGRAM: Wall motion is normal. Ejection fraction is calculated at 71%.  There was no mitral regurgitation.  Abdominal aortogram reveals normal renal arteries and iliac arteries.  CORONARY ARTERIOGRAPHY: (Right dominant).  Left main: Left main has diffuse ectasia.  Left anterior descending: The left anterior descending artery has a tubular 80% stenosis in the proximal portion. In the midportion is a 40% followed by an 80% stenosis.  The distal LAD has a 30% stenosis.  The apical portion of the LAD has a discrete 95% stenosis. The LAD gives rise to a large single first diagonal branch which has a 30% stenosis proximally.  Left circumflex: The left  circumflex has a diffuse 20% stenosis in the mid vessel. It gives rise to a normal sized ramus intermedius with a 40% stenosis proximally. There is a normal sized first obtuse marginal branch which has a 90% stenosis proximally.  Right coronary artery: The right coronary artery is a dominant vessel.  There is a 30% stenosis in the proximal vessel. The distal vessel has an area of diffuse ectasia with a 60% stenosis in the distal vessel proximal to the posterior descending artery. Just beyond the posterior descending artery the distal right coronary artery has a 99% stenosis. There is a normal to large posterior descending artery which has a 99.9% stenosis with TIMI-2 flow.  This is right at the origin of the posterior descending artery. In the midportion    of the PDA is an 80% stenosis. There is a normal sized first posterolateral branch with a 95% stenosis proximally.  IMPRESSIONS: 1. Normal left ventricular systolic function. 2. Severe three-vessel coronary artery disease as described.  PLAN: A Perclose was placed in the right femoral artery with good hemostasis. Heparin will be continued and Integrilin will be started. The patient will be referred for coronary artery bypass grafting to be done within the next 24 hours. Dictated by:   Daisey Must, M.D. [facility] Attending Physician:  Miguel Aschoff DD:  02/19/01 TD:  02/20/01 Job: [id number]

## 2010-10-22 ENCOUNTER — Telehealth: Payer: Self-pay | Admitting: Pulmonary Disease

## 2010-10-22 NOTE — Telephone Encounter (Signed)
LMOMTCB- this pt has not seen SN since 09' needs ov.

## 2010-10-22 NOTE — Telephone Encounter (Signed)
PATIENT WANTS TO KNOW IF HE NEEDS LAB TEST PRIOR TO OFFICE VISIT.  HAS NOT SCHED YET.

## 2010-10-22 NOTE — Telephone Encounter (Signed)
He should sched ov and let SN decide when/if labs need to be done. LMTCB.

## 2010-10-23 NOTE — Telephone Encounter (Signed)
Spoke with pt.  He is requesting refill on synthroid. I advised lets schedule him appt with SN and then can give him enough med to last until appt. He states that this is fine, but wants appt to have labs done first to check his thyroid. I advised that it may be better to just have him come in first, since not seen in so long, SN may want to check more than his TSH.  He still wants to have TSH checked before he schedules appt. Please advise thanks!

## 2010-10-23 NOTE — Telephone Encounter (Addendum)
lmomtcb--Per SN---ok to do TSH now per pts request. Does he want to come in for ov for his thyroid or does he want to schedule cpx?  Needs to schedule appt and we can do refills of his meds. thanks

## 2010-10-26 NOTE — Telephone Encounter (Signed)
LMOMTCB

## 2010-10-29 NOTE — Telephone Encounter (Signed)
lmomtcb x 3 per office protocol will sign off on note. Will need to start new phone note when calls back.

## 2011-02-26 ENCOUNTER — Other Ambulatory Visit: Payer: Self-pay | Admitting: Physician Assistant

## 2011-02-26 NOTE — Telephone Encounter (Signed)
Waiting on appointment to be made -- not seen since 1/11

## 2011-02-26 NOTE — Telephone Encounter (Signed)
Please note

## 2011-02-28 NOTE — Telephone Encounter (Signed)
Pt. LOV 05-2009 Br. Brodie. Message left pt. To call back. Vikki Ports

## 2011-03-05 ENCOUNTER — Other Ambulatory Visit: Payer: Self-pay | Admitting: *Deleted

## 2011-03-06 ENCOUNTER — Other Ambulatory Visit: Payer: Self-pay | Admitting: *Deleted

## 2011-03-07 ENCOUNTER — Telehealth: Payer: Self-pay | Admitting: Cardiology

## 2011-03-07 NOTE — Telephone Encounter (Signed)
Pt called [facility] is denying his rx's because he saw dr Juanda Chance. He made appt with dr Jens Som and would like refills. He also has questions about blood work please call

## 2011-03-07 NOTE — Telephone Encounter (Signed)
N/A.  LMTC. 

## 2011-03-07 NOTE — Telephone Encounter (Signed)
Pt calling to find out if Dr Jens Som would like him to come in for labs prior to his mid Nov appt.  He is also requesting that a TSH be ordered since he is overdue for it.  He also isn't sure if he has enough meds to last until his appt so is also requesting a refill of them.  He isn't sure which meds he is taking either but said that [facility] probably knows.

## 2011-03-07 NOTE — Telephone Encounter (Signed)
Patient has not been seen since Jan 2011. Will review need for labs at time of ov. Ok to refill cardiac meds. Olga Millers

## 2011-03-18 NOTE — Telephone Encounter (Signed)
Left message for pt, labs will be done at the time of appt. Asked pt to call back with his meds and we will refill those Deliah Goody

## 2011-03-28 ENCOUNTER — Ambulatory Visit (INDEPENDENT_AMBULATORY_CARE_PROVIDER_SITE_OTHER): Payer: 59 | Admitting: Cardiology

## 2011-03-28 ENCOUNTER — Encounter: Payer: Self-pay | Admitting: Cardiology

## 2011-03-28 DIAGNOSIS — E785 Hyperlipidemia, unspecified: Secondary | ICD-10-CM

## 2011-03-28 DIAGNOSIS — I10 Essential (primary) hypertension: Secondary | ICD-10-CM

## 2011-03-28 NOTE — Patient Instructions (Signed)
Your physician wants you to follow-up in: 1 YEAR You will receive a reminder letter in the mail two months in advance. If you don't receive a letter, please call our office to schedule the follow-up appointment.  Your physician recommends that you return for lab work in: AT [address] OFFICE NOTHING TO EAT OR DRINK AFTER MIDNIGHT  NIGHT BEFORE LAB WORK

## 2011-03-28 NOTE — Progress Notes (Signed)
HPI: Pleasant gentleman previously followed by Dr. Juanda Chance for followup of coronary disease. Patient had coronary artery bypass and graft in 2002. His last cardiac catheterization in 2003 revealed patent grafts but there was a stenosis in a posterior lateral branch that was not grafted. His last Myoview was performed in January of 2011. His ejection fraction was 71% and perfusion was normal. Since he was last seen, the patient denies any dyspnea on exertion, orthopnea, PND, pedal edema, palpitations, syncope or chest pain.  No current outpatient prescriptions on file.     Past Medical History  Diagnosis Date  . CAD (coronary artery disease)   . Rhinitis, allergic   . Acute asthmatic bronchitis   . Hyperlipemia   . Hypothyroidism   . IBS (irritable bowel syndrome)   . BPH (benign prostatic hypertrophy)     mild  . Fatty liver   . Nonspecific colitis     Past Surgical History  Procedure Date  . Coronary artery bypass graft 2002  . Lumbar laminectomy 1990    History   Social History  . Marital Status: Married    Spouse Name: N/A    Number of Children: 2  . Years of Education: N/A   Occupational History  . management    Social History Main Topics  . Smoking status: Never Smoker   . Smokeless tobacco: Not on file  . Alcohol Use: 1.5 oz/week    3 drink(s) per week     occasionally  . Drug Use: Not on file  . Sexually Active: Not on file   Other Topics Concern  . Not on file   Social History Narrative  . No narrative on file    ROS: no fevers or chills, productive cough, hemoptysis, dysphasia, odynophagia, melena, hematochezia, dysuria, hematuria, rash, seizure activity, orthopnea, PND, pedal edema, claudication. Remaining systems are negative.  Physical Exam: Well-developed well-nourished in no acute distress.  Skin is warm and dry.  HEENT is normal.  Neck is supple. No thyromegaly.  Chest is clear to auscultation with normal expansion.  Cardiovascular exam is  regular rate and rhythm.  Abdominal exam nontender or distended. No masses palpated. Extremities show no edema. neuro grossly intact  ECG sinus rhythm at a rate of 63. Incomplete right bundle branch block. No ST changes.

## 2011-03-28 NOTE — Assessment & Plan Note (Signed)
Check TSH at patient's request. Not taking thyroid replacement at present.

## 2011-03-28 NOTE — Assessment & Plan Note (Signed)
Continue aspirin and statin. Patient will contact us with a list of his medications. Continue risk factor modification. Repeat Myoview with he returns in one year.

## 2011-03-28 NOTE — Assessment & Plan Note (Signed)
Continue statin. Check lipids and liver. 

## 2011-03-28 NOTE — Assessment & Plan Note (Signed)
Blood pressure controlled. Check potassium and renal function. 

## 2011-05-10 ENCOUNTER — Encounter: Payer: Self-pay | Admitting: *Deleted

## 2011-05-30 ENCOUNTER — Other Ambulatory Visit (INDEPENDENT_AMBULATORY_CARE_PROVIDER_SITE_OTHER): Payer: 59

## 2011-05-30 DIAGNOSIS — I10 Essential (primary) hypertension: Secondary | ICD-10-CM

## 2011-05-30 DIAGNOSIS — E785 Hyperlipidemia, unspecified: Secondary | ICD-10-CM

## 2011-05-30 LAB — HEPATIC FUNCTION PANEL
ALT: 42 U/L (ref 0–53)
Albumin: 3.9 g/dL (ref 3.5–5.2)
Bilirubin, Direct: 0.1 mg/dL (ref 0.0–0.3)
Total Protein: 6.6 g/dL (ref 6.0–8.3)

## 2011-05-30 LAB — BASIC METABOLIC PANEL
BUN: 20 mg/dL (ref 6–23)
CO2: 25 mEq/L (ref 19–32)
Calcium: 9.1 mg/dL (ref 8.4–10.5)
Chloride: 106 mEq/L (ref 96–112)
Creatinine, Ser: 1.1 mg/dL (ref 0.4–1.5)
Glucose, Bld: 94 mg/dL (ref 70–99)

## 2011-05-30 LAB — TSH: TSH: 6.7 u[IU]/mL — ABNORMAL HIGH (ref 0.35–5.50)

## 2011-05-30 LAB — LIPID PANEL
LDL Cholesterol: 52 mg/dL (ref 0–99)
Total CHOL/HDL Ratio: 3
Triglycerides: 93 mg/dL (ref 0.0–149.0)

## 2011-06-04 ENCOUNTER — Telehealth: Payer: Self-pay | Admitting: Cardiology

## 2011-06-04 NOTE — Telephone Encounter (Signed)
Fu call °Patient returning your call about lab results  °

## 2011-06-04 NOTE — Telephone Encounter (Signed)
Spoke with pt, aware of labs. Forwarded to dr Kriste Basque for his review. Pt reports he stopped taking his thyroid meds.

## 2011-06-12 ENCOUNTER — Telehealth: Payer: Self-pay | Admitting: Pulmonary Disease

## 2011-06-12 DIAGNOSIS — E039 Hypothyroidism, unspecified: Secondary | ICD-10-CM

## 2011-06-12 NOTE — Telephone Encounter (Signed)
Patient returning call.

## 2011-06-12 NOTE — Telephone Encounter (Signed)
lmomtcb  

## 2011-06-12 NOTE — Telephone Encounter (Signed)
lmomtcb to speak to pt about this message.  Last seen in 2009 by SN---SN did receive his lab work but we are not able to write an rx at this time since it has been since 2009 when last seen.  Our practice is  mainly doing pulmonary now and we can help him get in with primary care downstairs or we can get him in to see specialist--Dr. Everardo All for his thyroid issues.

## 2011-06-12 NOTE — Telephone Encounter (Signed)
Spoke with the pt and he states he received a call from our office about his thyroid. He states he had some labs done on 05-30-11 by Dr. Jens Som and they were to be forwarded to Dr. Kriste Basque because his thyroid level was abnormal and they want Dr. Kriste Basque to prescribe the pt medication for this. The pt states he used to be on thyroid medication but he had stopped this.  Leigh did you call the patient? I have printed pt labs as well. Please advise. Carron Curie, CMA

## 2011-06-13 NOTE — Telephone Encounter (Signed)
Spoke with pt and notified of recs per SN. Pt verbalized understanding. States would like referral to PCP at [facility]. I have sent order to [facility] for this.

## 2011-07-15 ENCOUNTER — Ambulatory Visit: Payer: 59 | Admitting: Internal Medicine

## 2011-07-17 ENCOUNTER — Ambulatory Visit: Payer: 59 | Admitting: Internal Medicine

## 2011-09-10 ENCOUNTER — Ambulatory Visit: Payer: 59 | Admitting: Internal Medicine

## 2011-09-13 ENCOUNTER — Ambulatory Visit: Payer: 59 | Admitting: Internal Medicine

## 2011-10-18 ENCOUNTER — Ambulatory Visit: Payer: 59 | Admitting: Internal Medicine

## 2011-10-28 ENCOUNTER — Telehealth: Payer: Self-pay | Admitting: Internal Medicine

## 2011-10-28 NOTE — Telephone Encounter (Signed)
   [phone] Jasmine December)

## 2011-10-28 NOTE — Telephone Encounter (Signed)
Yes, you can put him in that spot on Friday

## 2011-10-28 NOTE — Telephone Encounter (Signed)
The pt is hoping to get in sooner for a new pt apt.  He is not feeling well and thinks he is having thyroid problems.  Do you want him worked in sooner?  You have a physical slot on Friday open, but that would give you 3 new pts for that day.   Thanks!

## 2011-10-30 ENCOUNTER — Encounter: Payer: Self-pay | Admitting: Internal Medicine

## 2011-10-30 ENCOUNTER — Ambulatory Visit (INDEPENDENT_AMBULATORY_CARE_PROVIDER_SITE_OTHER): Payer: 59 | Admitting: Internal Medicine

## 2011-10-30 ENCOUNTER — Other Ambulatory Visit (INDEPENDENT_AMBULATORY_CARE_PROVIDER_SITE_OTHER): Payer: 59

## 2011-10-30 VITALS — BP 114/70 | HR 53 | Temp 97.6°F | Resp 16 | Ht 66.0 in | Wt 194.0 lb

## 2011-10-30 DIAGNOSIS — E039 Hypothyroidism, unspecified: Secondary | ICD-10-CM

## 2011-10-30 DIAGNOSIS — E785 Hyperlipidemia, unspecified: Secondary | ICD-10-CM

## 2011-10-30 DIAGNOSIS — Z0000 Encounter for general adult medical examination without abnormal findings: Secondary | ICD-10-CM

## 2011-10-30 DIAGNOSIS — Z87898 Personal history of other specified conditions: Secondary | ICD-10-CM

## 2011-10-30 DIAGNOSIS — Z23 Encounter for immunization: Secondary | ICD-10-CM

## 2011-10-30 DIAGNOSIS — I2510 Atherosclerotic heart disease of native coronary artery without angina pectoris: Secondary | ICD-10-CM

## 2011-10-30 LAB — TSH: TSH: 3.71 u[IU]/mL (ref 0.35–5.50)

## 2011-10-30 LAB — URINALYSIS, ROUTINE W REFLEX MICROSCOPIC
Bilirubin Urine: NEGATIVE
Hgb urine dipstick: NEGATIVE
Total Protein, Urine: NEGATIVE
Urine Glucose: NEGATIVE

## 2011-10-30 LAB — LIPID PANEL: Total CHOL/HDL Ratio: 4

## 2011-10-30 LAB — CBC WITH DIFFERENTIAL/PLATELET
Basophils Absolute: 0 10*3/uL (ref 0.0–0.1)
Basophils Relative: 0.5 % (ref 0.0–3.0)
Eosinophils Absolute: 0.2 10*3/uL (ref 0.0–0.7)
Hemoglobin: 14.7 g/dL (ref 13.0–17.0)
MCHC: 33.1 g/dL (ref 30.0–36.0)
MCV: 95.2 fl (ref 78.0–100.0)
Monocytes Absolute: 0.6 10*3/uL (ref 0.1–1.0)
Neutro Abs: 3.5 10*3/uL (ref 1.4–7.7)
RBC: 4.67 Mil/uL (ref 4.22–5.81)
RDW: 13.8 % (ref 11.5–14.6)

## 2011-10-30 LAB — COMPREHENSIVE METABOLIC PANEL
ALT: 40 U/L (ref 0–53)
AST: 34 U/L (ref 0–37)
Albumin: 4 g/dL (ref 3.5–5.2)
Alkaline Phosphatase: 44 U/L (ref 39–117)
BUN: 16 mg/dL (ref 6–23)
Calcium: 9.2 mg/dL (ref 8.4–10.5)
Chloride: 104 mEq/L (ref 96–112)
Creatinine, Ser: 1.1 mg/dL (ref 0.4–1.5)
Potassium: 4.3 mEq/L (ref 3.5–5.1)

## 2011-10-30 LAB — FECAL OCCULT BLOOD, GUAIAC: Fecal Occult Blood: NEGATIVE

## 2011-10-30 MED ORDER — TAMSULOSIN HCL 0.4 MG PO CAPS: 0.4000 mg | ORAL_CAPSULE | Freq: Two times a day (BID) | ORAL | Status: DC

## 2011-10-30 MED ORDER — FENOFIBRATE 145 MG PO TABS: 145.0000 mg | ORAL_TABLET | Freq: Every day | ORAL | Status: DC

## 2011-10-30 MED ORDER — CLOPIDOGREL BISULFATE 75 MG PO TABS: 75.0000 mg | ORAL_TABLET | Freq: Every day | ORAL | Status: DC

## 2011-10-30 MED ORDER — LEVOTHYROXINE SODIUM 50 MCG PO TABS
50.0000 ug | ORAL_TABLET | Freq: Every day | ORAL | Status: DC
Start: 2011-10-30 — End: 2012-01-08

## 2011-10-30 MED ORDER — ROSUVASTATIN CALCIUM 10 MG PO TABS: 10.0000 mg | ORAL_TABLET | Freq: Every day | ORAL | Status: DC

## 2011-10-30 NOTE — Assessment & Plan Note (Signed)
Start synthroid and I will check his TSH today

## 2011-10-30 NOTE — Assessment & Plan Note (Signed)
Symptoms have responded well to flomax

## 2011-10-30 NOTE — Progress Notes (Signed)
Subjective:    Patient ID: Samuel Flowers, male    DOB: [birth year], 61 y.o.   MRN: [id number]  Thyroid Problem Presents for follow-up visit. Symptoms include fatigue and weight gain. Patient reports no anxiety, cold intolerance, constipation, depressed mood, diaphoresis, diarrhea, dry skin, hair loss, heat intolerance, hoarse voice, leg swelling, menstrual problem, nail problem, palpitations, tremors, visual change or weight loss. The symptoms have been worsening.      Review of Systems  Constitutional: Positive for weight gain, fatigue and unexpected weight change. Negative for fever, chills, weight loss, diaphoresis, activity change and appetite change.  HENT: Negative.  Negative for hoarse voice.   Eyes: Negative.   Respiratory: Negative for apnea, cough, choking, chest tightness, shortness of breath, wheezing and stridor.   Cardiovascular: Negative for chest pain, palpitations and leg swelling.  Gastrointestinal: Negative for nausea, vomiting, abdominal pain, diarrhea, constipation, blood in stool, abdominal distention, anal bleeding and rectal pain.  Genitourinary: Negative for dysuria, urgency, frequency, hematuria, flank pain, decreased urine volume, discharge, penile swelling, scrotal swelling, enuresis, difficulty urinating, genital sores, penile pain, testicular pain and menstrual problem.  Musculoskeletal: Negative.   Skin: Negative.   Neurological: Negative.  Negative for tremors.  Hematological: Negative for cold intolerance, heat intolerance and adenopathy. Does not bruise/bleed easily.  Psychiatric/Behavioral: Negative.        Objective:   Physical Exam  Vitals reviewed. Constitutional: He is oriented to person, place, and time. He appears well-developed and well-nourished. No distress.  HENT:  Head: Normocephalic and atraumatic.  Mouth/Throat: Oropharynx is clear and moist. No oropharyngeal exudate.  Eyes: Conjunctivae are normal. Right eye exhibits no discharge.  Left eye exhibits no discharge. No scleral icterus.  Neck: Normal range of motion. Neck supple. No JVD present. No tracheal deviation present. No thyromegaly present.  Cardiovascular: Normal rate, regular rhythm, normal heart sounds and intact distal pulses.  Exam reveals no gallop and no friction rub.   No murmur heard. Pulmonary/Chest: Effort normal and breath sounds normal. No stridor. No respiratory distress. He has no wheezes. He has no rales. He exhibits no tenderness.  Abdominal: Soft. Bowel sounds are normal. He exhibits no distension and no mass. There is no tenderness. There is no rebound and no guarding. Hernia confirmed negative in the right inguinal area and confirmed negative in the left inguinal area.  Genitourinary: Rectum normal, testes normal and penis normal. Rectal exam shows no external hemorrhoid, no internal hemorrhoid, no fissure, no mass, no tenderness and anal tone normal. Guaiac negative stool. Prostate is enlarged (1+ smooth bilateral symm BPH). Prostate is not tender. Right testis shows no mass, no swelling and no tenderness. Right testis is descended. Left testis shows no mass, no swelling and no tenderness. Left testis is descended. Circumcised. No penile tenderness. No discharge found.  Musculoskeletal: Normal range of motion. He exhibits no edema and no tenderness.  Lymphadenopathy:    He has no cervical adenopathy.       Right: No inguinal adenopathy present.       Left: No inguinal adenopathy present.  Neurological: He is oriented to person, place, and time.  Skin: Skin is warm and dry. No rash noted. He is not diaphoretic. No erythema. No pallor.  Psychiatric: He has a normal mood and affect. His behavior is normal. Judgment and thought content normal.      Lab Results  Component Value Date   WBC 6.7 01/18/2010   HGB 15.2 01/18/2010   HCT 44.5 01/18/2010   PLT 233.0 01/18/2010  GLUCOSE 94 05/30/2011   CHOL 113 05/30/2011   TRIG 93.0 05/30/2011   HDL 42.80  05/30/2011   LDLDIRECT 138.7 05/15/2009   LDLCALC 52 05/30/2011   ALT 42 05/30/2011   AST 35 05/30/2011   NA 140 05/30/2011   K 4.4 05/30/2011   CL 106 05/30/2011   CREATININE 1.1 05/30/2011   BUN 20 05/30/2011   CO2 25 05/30/2011   TSH 6.70* 05/30/2011      Assessment & Plan:

## 2011-10-30 NOTE — Assessment & Plan Note (Signed)
Exam done, vaccines were updated, labs ordered, pt ed material was given 

## 2011-10-30 NOTE — Assessment & Plan Note (Signed)
He is doing well on crestor and tricor

## 2011-10-30 NOTE — Patient Instructions (Signed)
Health Maintenance, Males A healthy lifestyle and preventative care can promote health and wellness.  Maintain regular health, dental, and eye exams.   Eat a healthy diet. Foods like vegetables, fruits, whole grains, low-fat dairy products, and lean protein foods contain the nutrients you need without too many calories. Decrease your intake of foods high in solid fats, added sugars, and salt. Get information about a proper diet from your caregiver, if necessary.   Regular physical exercise is one of the most important things you can do for your health. Most adults should get at least 150 minutes of moderate-intensity exercise (any activity that increases your heart rate and causes you to sweat) each week. In addition, most adults need muscle-strengthening exercises on 2 or more days a week.    Maintain a healthy weight. The body mass index (BMI) is a screening tool to identify possible weight problems. It provides an estimate of body fat based on height and weight. Your caregiver can help determine your BMI, and can help you achieve or maintain a healthy weight. For adults 20 years and older:   A BMI below 18.5 is considered underweight.   A BMI of 18.5 to 24.9 is normal.   A BMI of 25 to 29.9 is considered overweight.   A BMI of 30 and above is considered obese.   Maintain normal blood lipids and cholesterol by exercising and minimizing your intake of saturated fat. Eat a balanced diet with plenty of fruits and vegetables. Blood tests for lipids and cholesterol should begin at age 20 and be repeated every 5 years. If your lipid or cholesterol levels are high, you are over 50, or you are a high risk for heart disease, you may need your cholesterol levels checked more frequently.Ongoing high lipid and cholesterol levels should be treated with medicines, if diet and exercise are not effective.   If you smoke, find out from your caregiver how to quit. If you do not use tobacco, do not start.    If you choose to drink alcohol, do not exceed 2 drinks per day. One drink is considered to be 12 ounces (355 mL) of beer, 5 ounces (148 mL) of wine, or 1.5 ounces (44 mL) of liquor.   Avoid use of street drugs. Do not share needles with anyone. Ask for help if you need support or instructions about stopping the use of drugs.   High blood pressure causes heart disease and increases the risk of stroke. Blood pressure should be checked at least every 1 to 2 years. Ongoing high blood pressure should be treated with medicines if weight loss and exercise are not effective.   If you are 45 to 60 years old, ask your caregiver if you should take aspirin to prevent heart disease.   Diabetes screening involves taking a blood sample to check your fasting blood sugar level. This should be done once every 3 years, after age 45, if you are within normal weight and without risk factors for diabetes. Testing should be considered at a younger age or be carried out more frequently if you are overweight and have at least 1 risk factor for diabetes.   Colorectal cancer can be detected and often prevented. Most routine colorectal cancer screening begins at the age of 50 and continues through age 75. However, your caregiver may recommend screening at an earlier age if you have risk factors for colon cancer. On a yearly basis, your caregiver may provide home test kits to check for hidden   blood in the stool. Use of a small camera at the end of a tube, to directly examine the colon (sigmoidoscopy or colonoscopy), can detect the earliest forms of colorectal cancer. Talk to your caregiver about this at age 77, when routine screening begins. Direct examination of the colon should be repeated every 5 to 10 years through age 33, unless early forms of pre-cancerous polyps or small growths are found.   Hepatitis C blood testing is recommended for all people born from [birth year] through 1964 and any individual with known risks for  hepatitis C.   Healthy men should no longer receive prostate-specific antigen (PSA) blood tests as part of routine cancer screening. Consult with your caregiver about prostate cancer screening.   Testicular cancer screening is not recommended for adolescents or adult males who have no symptoms. Screening includes self-exam, caregiver exam, and other screening tests. Consult with your caregiver about any symptoms you have or any concerns you have about testicular cancer.   Practice safe sex. Use condoms and avoid high-risk sexual practices to reduce the spread of sexually transmitted infections (STIs).   Use sunscreen with a sun protection factor (SPF) of 30 or greater. Apply sunscreen liberally and repeatedly throughout the day. You should seek shade when your shadow is shorter than you. Protect yourself by wearing long sleeves, pants, a wide-brimmed hat, and sunglasses year round, whenever you are outdoors.   Notify your caregiver of new moles or changes in moles, especially if there is a change in shape or color. Also notify your caregiver if a mole is larger than the size of a pencil eraser.   A one-time screening for abdominal aortic aneurysm (AAA) and surgical repair of large AAAs by sound wave imaging (ultrasonography) is recommended for ages 68 to 91 years who are current or former smokers.   Stay current with your immunizations.  Document Released: 10/26/2007 Document Revised: 04/18/2011 Document Reviewed: 09/24/2010 [facility] Patient Information 2012 [place], [place].Hypothyroidism The thyroid is a large gland located in the lower front of your neck. The thyroid gland helps control metabolism. Metabolism is how your body handles food. It controls metabolism with the hormone thyroxine. When this gland is underactive (hypothyroid), it produces too little hormone.  CAUSES These include:   Absence or destruction of thyroid tissue.   Goiter due to iodine deficiency.   Goiter due to  medications.   Congenital defects (since birth).   Problems with the pituitary. This causes a lack of TSH (thyroid stimulating hormone). This hormone tells the thyroid to turn out more hormone.  SYMPTOMS  Lethargy (feeling as though you have no energy)   Cold intolerance   Weight gain (in spite of normal food intake)   Dry skin   Coarse hair   Menstrual irregularity (if severe, may lead to infertility)   Slowing of thought processes  Cardiac problems are also caused by insufficient amounts of thyroid hormone. Hypothyroidism in the newborn is cretinism, and is an extreme form. It is important that this form be treated adequately and immediately or it will lead rapidly to retarded physical and mental development. DIAGNOSIS  To prove hypothyroidism, your caregiver may do blood tests and ultrasound tests. Sometimes the signs are hidden. It may be necessary for your caregiver to watch this illness with blood tests either before or after diagnosis and treatment. TREATMENT  Low levels of thyroid hormone are increased by using synthetic thyroid hormone. This is a safe, effective treatment. It usually takes about four weeks to gain the  full effects of the medication. After you have the full effect of the medication, it will generally take another four weeks for problems to leave. Your caregiver may start you on low doses. If you have had heart problems the dose may be gradually increased. It is generally not an emergency to get rapidly to normal. HOME CARE INSTRUCTIONS   Take your medications as your caregiver suggests. Let your caregiver know of any medications you are taking or start taking. Your caregiver will help you with dosage schedules.   As your condition improves, your dosage needs may increase. It will be necessary to have continuing blood tests as suggested by your caregiver.   Report all suspected medication side effects to your caregiver.  SEEK MEDICAL CARE IF: Seek medical care  if you develop:  Sweating.   Tremulousness (tremors).   Anxiety.   Rapid weight loss.   Heat intolerance.   Emotional swings.   Diarrhea.   Weakness.  SEEK IMMEDIATE MEDICAL CARE IF:  You develop chest pain, an irregular heart beat (palpitations), or a rapid heart beat. MAKE SURE YOU:   Understand these instructions.   Will watch your condition.   Will get help right away if you are not doing well or get worse.  Document Released: 04/29/2005 Document Revised: 04/18/2011 Document Reviewed: 12/18/2007 [facility] Patient Information 2012 [place], [place].

## 2011-11-01 ENCOUNTER — Ambulatory Visit: Payer: 59 | Admitting: Internal Medicine

## 2011-11-18 ENCOUNTER — Ambulatory Visit: Payer: 59 | Admitting: Internal Medicine

## 2011-12-24 ENCOUNTER — Ambulatory Visit: Payer: 59 | Admitting: Internal Medicine

## 2012-01-01 ENCOUNTER — Telehealth: Payer: Self-pay

## 2012-01-01 DIAGNOSIS — E039 Hypothyroidism, unspecified: Secondary | ICD-10-CM

## 2012-01-01 NOTE — Telephone Encounter (Signed)
Patient called stating that at 10/30/11 office visit he requested that rx for synthroid be sent to [facility]. Per Epic, pt was given samples and lab letter advised to recheck in 2 months. Patient notified and advised to have TSH rechecked before sending any rx e-script incase of any dose changes. Orders place to have TSH collected.

## 2012-01-08 ENCOUNTER — Other Ambulatory Visit: Payer: Self-pay | Admitting: Internal Medicine

## 2012-01-08 ENCOUNTER — Other Ambulatory Visit (INDEPENDENT_AMBULATORY_CARE_PROVIDER_SITE_OTHER): Payer: 59

## 2012-01-08 DIAGNOSIS — E039 Hypothyroidism, unspecified: Secondary | ICD-10-CM

## 2012-01-08 MED ORDER — LEVOTHYROXINE SODIUM 75 MCG PO TABS: 75.0000 ug | ORAL_TABLET | Freq: Every day | ORAL | Status: DC

## 2012-03-09 ENCOUNTER — Ambulatory Visit: Payer: 59 | Admitting: Pulmonary Disease

## 2012-04-23 ENCOUNTER — Ambulatory Visit (INDEPENDENT_AMBULATORY_CARE_PROVIDER_SITE_OTHER): Payer: 59 | Admitting: Family Medicine

## 2012-04-23 ENCOUNTER — Ambulatory Visit: Payer: 59

## 2012-04-23 VITALS — BP 118/76 | HR 76 | Temp 99.0°F | Resp 16 | Ht 68.0 in | Wt 192.4 lb

## 2012-04-23 DIAGNOSIS — R05 Cough: Secondary | ICD-10-CM

## 2012-04-23 DIAGNOSIS — R509 Fever, unspecified: Secondary | ICD-10-CM

## 2012-04-23 DIAGNOSIS — R059 Cough, unspecified: Secondary | ICD-10-CM

## 2012-04-23 LAB — POCT INFLUENZA A/B
Influenza A, POC: NEGATIVE
Influenza B, POC: NEGATIVE

## 2012-04-23 MED ORDER — ALBUTEROL SULFATE HFA 108 (90 BASE) MCG/ACT IN AERS: 2.0000 | INHALATION_SPRAY | Freq: Four times a day (QID) | RESPIRATORY_TRACT | Status: DC | PRN

## 2012-04-23 MED ORDER — DOXYCYCLINE HYCLATE 100 MG PO TABS: 100.0000 mg | ORAL_TABLET | Freq: Two times a day (BID) | ORAL | Status: DC

## 2012-04-23 NOTE — Patient Instructions (Addendum)
Use the antibiotic as directed- take with food and water.    Use the inhaler as needed for cough attacks. If not helpful you can stop using this.   Let me know if you are not better in the next 2 or 3 days- Sooner if worse.

## 2012-04-23 NOTE — Progress Notes (Signed)
[facility] [address] [place] [address] [phone]  Date:  04/23/2012   Name:  Samuel Flowers   DOB:  04/01/1951   MRN:  [id number]  PCP:  Sanda Linger, MD    Chief Complaint: Cough   History of Present Illness:  Samuel Flowers is a 61 y.o. very pleasant male patient who presents with the following:  He is here today with a cough- he has noted severe cough attacks.  He has "so much phlegm" that he almost chokes.  He has had this sort of problem on occasion for a few years.  He has usually managed this at home.  He describes it as an "instanteous fluid build- up" in his lungs that causes him to choke and cough.    He has had a barking cough since yesterday.  He was up all night with the cough. He is better since he got up this am.  His wife also noted that he was wheezing yesterday- no wheezing now.   They had noted a fever.  He feels achy today.   No ST, no earache.  Mild runny nose.    He does have a history of allergies  He does not have a history of CHF.    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  Normal Myoview in 2011 with EF of 71%  Patient Active Problem List  Diagnosis  . HYPOTHYROIDISM  . HYPERLIPIDEMIA  . HYPERTENSION  . CORONARY ARTERY DISEASE  . ALLERGIC RHINITIS  . Irritable bowel syndrome  . BENIGN PROSTATIC HYPERTROPHY, MILD, HX OF  . Routine general medical examination at a health care facility    Past Medical History  Diagnosis Date  . CAD (coronary artery disease)   . Rhinitis, allergic   . Acute asthmatic bronchitis   . Hyperlipemia   . Hypothyroidism   . IBS (irritable bowel syndrome)   . BPH (benign prostatic hypertrophy)     mild  . Fatty liver   . Nonspecific colitis     Past Surgical History  Procedure Date  . Coronary artery bypass graft 2002  . Lumbar laminectomy 1990    History  Substance Use Topics  . Smoking status: Never Smoker   . Smokeless tobacco: Never Used   . Alcohol Use: 1.5 oz/week    3 drink(s) per week     Comment: occasionally    Family History  Problem Relation Age of Onset  . Colon polyps Father   . Coronary artery disease Father   . Heart disease Father   . Diabetes Father   . Hyperlipidemia Father   . Coronary artery disease Mother   . Colon cancer Paternal Aunt   . Irritable bowel syndrome Neg Hx   . Stroke Neg Hx   . Alcohol abuse Neg Hx   . Cancer Neg Hx   . Depression Neg Hx   . Early death Neg Hx   . Hypertension Neg Hx     No Known Allergies  Medication list has been reviewed and updated.  Current Outpatient Prescriptions on File Prior to Visit  Medication Sig Dispense Refill  . clopidogrel (PLAVIX) 75 MG tablet Take 1 tablet (75 mg total) by mouth daily.  90 tablet  1  . fenofibrate (TRICOR) 145 MG tablet Take 1 tablet (145 mg total) by mouth daily.  90 tablet  1  . levothyroxine (SYNTHROID, LEVOTHROID) 75 MCG tablet Take 1  tablet (75 mcg total) by mouth daily.  90 tablet  1  . rosuvastatin (CRESTOR) 10 MG tablet Take 1 tablet (10 mg total) by mouth daily.  90 tablet  3  . Tamsulosin HCl (FLOMAX) 0.4 MG CAPS Take 1 capsule (0.4 mg total) by mouth 2 (two) times daily.  180 capsule  3    Review of Systems:  As per HPI- otherwise negative.   Physical Examination: Filed Vitals:   04/23/12 0842  BP: 118/76  Pulse: 76  Temp: 99 F (37.2 C)  Resp: 16   Filed Vitals:   04/23/12 0842  Height: 5\' 8"  (1.727 m)  Weight: 192 lb 6.4 oz (87.272 kg)   Body mass index is 29.25 kg/(m^2). Ideal Body Weight: Weight in (lb) to have BMI = 25: 164.1   GEN: WDWN, NAD, Non-toxic, A & O x 3, overweight HEENT: Atraumatic, Normocephalic. Neck supple. No masses, No LAD. Bilateral TM wnl, oropharynx normal.  PEERL,EOMI.   Nasal congestion Ears and Nose: No external deformity. CV: RRR, No M/G/R. No JVD. No thrill. No extra heart sounds. PULM: CTA B, no wheezes, crackles, rhonchi. No retractions. No resp. distress. No  accessory muscle use. ABD: S, NT, ND, +BS. No rebound. No HSM. EXTR: No c/c/e NEURO Normal gait.  PSYCH: Normally interactive. Conversant. Not depressed or anxious appearing.  Calm demeanor.   [facility] reading (PRIMARY) by  Dr. Patsy Lager. CXR: cardiomegaly, fluid in right fissure.  No effusion or definite infiltrate. History of CABG  Clinical Data: Cough  CHEST - 2 VIEW  Comparison: Preliminary reading Dr. Patsy Lager  Findings: Cardiomediastinal silhouette is unremarkable. Status post CABG. No acute infiltrate or pulmonary edema. Trace fluid or thickening noted right minor fissure.  IMPRESSION: No active disease. Status post CABG.   Results for orders placed in visit on 04/23/12  POCT INFLUENZA A/B      Component Value Range   Influenza A, POC Negative     Influenza B, POC Negative       Assessment and Plan: 1. Cough  POCT Influenza A/B, DG Chest 2 View, doxycycline (VIBRA-TABS) 100 MG tablet, albuterol (PROVENTIL HFA;VENTOLIN HFA) 108 (90 BASE) MCG/ACT inhaler  2. Fever  POCT Influenza A/B, DG Chest 2 View   Treat for bronchitis as above.  mucinex is fine for thinning his secretions.  He may have some element of bronchospasm so will also try albuterol.  See patient instructions for more details.   Close follow- up if not better  Abbe Amsterdam, MD

## 2012-04-28 ENCOUNTER — Ambulatory Visit (INDEPENDENT_AMBULATORY_CARE_PROVIDER_SITE_OTHER): Payer: 59 | Admitting: Internal Medicine

## 2012-04-28 ENCOUNTER — Encounter: Payer: Self-pay | Admitting: Internal Medicine

## 2012-04-28 VITALS — BP 122/70 | HR 57 | Temp 97.5°F | Ht 66.0 in

## 2012-04-28 DIAGNOSIS — J40 Bronchitis, not specified as acute or chronic: Secondary | ICD-10-CM

## 2012-04-28 MED ORDER — HYDROCODONE-HOMATROPINE 5-1.5 MG/5ML PO SYRP: 5.0000 mL | ORAL_SOLUTION | Freq: Three times a day (TID) | ORAL | Status: DC | PRN

## 2012-04-28 NOTE — Patient Instructions (Addendum)

## 2012-04-28 NOTE — Progress Notes (Signed)
HPI  Pt presents to the clinic today with c/o cough. He went to [facility] on 04/23/12 and was diagnosed with bronchitis. He was started on Doxycycline. He has been feeling better but the cough is so bad at night it makes him wake up choking on his own phlegm. He has tried robitussin and Nyquil without relief. He would like something for the cough.  Review of Systems    Past Medical History  Diagnosis Date  . CAD (coronary artery disease)   . Rhinitis, allergic   . Acute asthmatic bronchitis   . Hyperlipemia   . Hypothyroidism   . IBS (irritable bowel syndrome)   . BPH (benign prostatic hypertrophy)     mild  . Fatty liver   . Nonspecific colitis     Family History  Problem Relation Age of Onset  . Colon polyps Father   . Coronary artery disease Father   . Heart disease Father   . Diabetes Father   . Hyperlipidemia Father   . Coronary artery disease Mother   . Colon cancer Paternal Aunt   . Irritable bowel syndrome Neg Hx   . Stroke Neg Hx   . Alcohol abuse Neg Hx   . Cancer Neg Hx   . Depression Neg Hx   . Early death Neg Hx   . Hypertension Neg Hx     History   Social History  . Marital Status: Married    Spouse Name: N/A    Number of Children: 2  . Years of Education: N/A   Occupational History  . management    Social History Main Topics  . Smoking status: Never Smoker   . Smokeless tobacco: Never Used  . Alcohol Use: 1.5 oz/week    3 drink(s) per week     Comment: occasionally  . Drug Use: No  . Sexually Active: Yes   Other Topics Concern  . Not on file   Social History Narrative  . No narrative on file    No Known Allergies   Constitutional: Positive headache, fatigue and fever. Denies abrupt weight changes.  HEENT:  Positive sore throat. Denies eye redness, ear pain, ringing in the ears, wax buildup, runny nose or bloody nose. Respiratory: Positive cough and thick green sputum production. Denies difficulty breathing or shortness of breath.   Cardiovascular: Denies chest pain, chest tightness, palpitations or swelling in the hands or feet.   No other specific complaints in a complete review of systems (except as listed in HPI above).  Objective:    General: Appears his stated age, well developed, well nourished in NAD. HEENT: Head: normal shape and size; Eyes: sclera white, no icterus, conjunctiva pink, PERRLA and EOMs intact; Ears: Tm's gray and intact, normal light reflex; Nose: mucosa pink and moist, septum midline; Throat/Mouth: + PND. Teeth present, mucosa erythematous and moist, no exudate noted, no lesions or ulcerations noted.  Neck: Mild cervical lymphadenopathy. Neck supple, trachea midline. No massses, lumps or thyromegaly present.  Cardiovascular: Normal rate and rhythm. S1,S2 noted.  No murmur, rubs or gallops noted. No JVD or BLE edema. No carotid bruits noted. Pulmonary/Chest: Normal effort and scattered rhonchi throughout. No respiratory distress. No wheezes, rales or ronchi noted.      Assessment & Plan:   Acute bacterial sinusitis  Continue using Doxycycline as prescribed Will add Hycodan cough syrup  If not better by next Thursday, call back and we will change abx to Levaquin  RTC as needed or if symptoms persist.

## 2012-06-13 ENCOUNTER — Other Ambulatory Visit: Payer: Self-pay | Admitting: Internal Medicine

## 2012-06-27 ENCOUNTER — Other Ambulatory Visit: Payer: Self-pay

## 2012-09-20 ENCOUNTER — Other Ambulatory Visit: Payer: Self-pay | Admitting: Internal Medicine

## 2013-01-28 ENCOUNTER — Encounter: Payer: Self-pay | Admitting: Cardiology

## 2013-01-28 ENCOUNTER — Ambulatory Visit (INDEPENDENT_AMBULATORY_CARE_PROVIDER_SITE_OTHER): Payer: 59 | Admitting: Cardiology

## 2013-01-28 VITALS — BP 124/76 | HR 53 | Ht 66.0 in | Wt 196.0 lb

## 2013-01-28 DIAGNOSIS — E785 Hyperlipidemia, unspecified: Secondary | ICD-10-CM

## 2013-01-28 DIAGNOSIS — I2510 Atherosclerotic heart disease of native coronary artery without angina pectoris: Secondary | ICD-10-CM

## 2013-01-28 NOTE — Assessment & Plan Note (Signed)
Continue present medications. Check lipids and liver. 

## 2013-01-28 NOTE — Patient Instructions (Addendum)
Your physician wants you to follow-up in: ONE YEAR WITH DR Shelda Pal will receive a reminder letter in the mail two months in advance. If you don't receive a letter, please call our office to schedule the follow-up appointment.   Your physician has requested that you have en exercise stress myoview. For further information please visit [email or web address] Please follow instruction sheet, as given.   Your physician recommends that you return for lab work in: WITH STRESS TEST  STOP PLAVIX

## 2013-01-28 NOTE — Assessment & Plan Note (Signed)
Continue aspirin and statin. Discontinue Plavix. Schedule Myoview for risk stratification.

## 2013-01-28 NOTE — Progress Notes (Signed)
   HPI: Pleasant gentleman previously followed by Dr. Juanda Chance for followup of coronary disease. Patient had coronary artery bypass and graft in 2002. His last cardiac catheterization in 2003 revealed patent grafts but there was a stenosis in a posterior lateral branch that was not grafted. His last Myoview was performed in January of 2011. His ejection fraction was 71% and perfusion was normal. Since he was last seen, the patient denies any dyspnea on exertion, orthopnea, PND, pedal edema, palpitations, syncope or chest pain.       Current Outpatient Prescriptions  Medication Sig Dispense Refill  . aspirin 81 MG tablet Take 81 mg by mouth daily.      . clopidogrel (PLAVIX) 75 MG tablet Take 1 tablet (75 mg total) by mouth daily.  90 tablet  1  . doxycycline (VIBRA-TABS) 100 MG tablet Take 1 tablet (100 mg total) by mouth 2 (two) times daily.  20 tablet  0  . fenofibrate (TRICOR) 145 MG tablet Take 1 tablet (145 mg total) by mouth daily.  90 tablet  1  . rosuvastatin (CRESTOR) 10 MG tablet Take 1 tablet (10 mg total) by mouth daily.  90 tablet  3  . tamsulosin (FLOMAX) 0.4 MG CAPS TAKE 1 CAPSULE (0.4 MG TOTAL) TWICE A DAY  180 capsule  0   No current facility-administered medications for this visit.     Past Medical History  Diagnosis Date  . CAD (coronary artery disease)   . Rhinitis, allergic   . Acute asthmatic bronchitis   . Hyperlipemia   . Hypothyroidism   . IBS (irritable bowel syndrome)   . BPH (benign prostatic hypertrophy)     mild  . Fatty liver   . Nonspecific colitis     Past Surgical History  Procedure Laterality Date  . Coronary artery bypass graft  2002  . Lumbar laminectomy  1990    History   Social History  . Marital Status: Married    Spouse Name: N/A    Number of Children: 2  . Years of Education: N/A   Occupational History  . management    Social History Main Topics  . Smoking status: Never Smoker   . Smokeless tobacco: Never Used  . Alcohol  Use: 1.5 oz/week    3 drink(s) per week     Comment: occasionally  . Drug Use: No  . Sexual Activity: Yes   Other Topics Concern  . Not on file   Social History Narrative  . No narrative on file    ROS: no fevers or chills, productive cough, hemoptysis, dysphasia, odynophagia, melena, hematochezia, dysuria, hematuria, rash, seizure activity, orthopnea, PND, pedal edema, claudication. Remaining systems are negative.  Physical Exam: Well-developed well-nourished in no acute distress.  Skin is warm and dry.  HEENT is normal.  Neck is supple.  Chest is clear to auscultation with normal expansion.  Cardiovascular exam is regular rate and rhythm.  Abdominal exam nontender or distended. No masses palpated. Extremities show no edema. neuro grossly intact  ECG sinus rhythm at a rate of 53. Incomplete right bundle branch block. No ST changes.

## 2013-03-03 ENCOUNTER — Encounter: Payer: Self-pay | Admitting: Cardiovascular Disease

## 2013-03-09 ENCOUNTER — Ambulatory Visit (HOSPITAL_COMMUNITY): Payer: 59 | Attending: Cardiology | Admitting: Radiology

## 2013-03-09 ENCOUNTER — Other Ambulatory Visit (INDEPENDENT_AMBULATORY_CARE_PROVIDER_SITE_OTHER): Payer: 59

## 2013-03-09 VITALS — BP 130/67 | HR 50 | Ht 66.0 in | Wt 193.0 lb

## 2013-03-09 DIAGNOSIS — Z8249 Family history of ischemic heart disease and other diseases of the circulatory system: Secondary | ICD-10-CM | POA: Insufficient documentation

## 2013-03-09 DIAGNOSIS — E785 Hyperlipidemia, unspecified: Secondary | ICD-10-CM

## 2013-03-09 DIAGNOSIS — I2510 Atherosclerotic heart disease of native coronary artery without angina pectoris: Secondary | ICD-10-CM

## 2013-03-09 DIAGNOSIS — R5381 Other malaise: Secondary | ICD-10-CM | POA: Insufficient documentation

## 2013-03-09 DIAGNOSIS — Z951 Presence of aortocoronary bypass graft: Secondary | ICD-10-CM | POA: Insufficient documentation

## 2013-03-09 DIAGNOSIS — I10 Essential (primary) hypertension: Secondary | ICD-10-CM | POA: Insufficient documentation

## 2013-03-09 DIAGNOSIS — R0602 Shortness of breath: Secondary | ICD-10-CM | POA: Insufficient documentation

## 2013-03-09 LAB — HEPATIC FUNCTION PANEL
AST: 27 U/L (ref 0–37)
Albumin: 3.8 g/dL (ref 3.5–5.2)
Alkaline Phosphatase: 48 U/L (ref 39–117)
Bilirubin, Direct: 0 mg/dL (ref 0.0–0.3)
Total Bilirubin: 0.6 mg/dL (ref 0.3–1.2)

## 2013-03-09 LAB — BASIC METABOLIC PANEL
CO2: 28 mEq/L (ref 19–32)
Calcium: 9.2 mg/dL (ref 8.4–10.5)
Chloride: 105 mEq/L (ref 96–112)
Creatinine, Ser: 1.1 mg/dL (ref 0.4–1.5)
Glucose, Bld: 103 mg/dL — ABNORMAL HIGH (ref 70–99)
Potassium: 4.3 mEq/L (ref 3.5–5.1)

## 2013-03-09 LAB — LIPID PANEL
Cholesterol: 242 mg/dL — ABNORMAL HIGH (ref 0–200)
Total CHOL/HDL Ratio: 5
Triglycerides: 288 mg/dL — ABNORMAL HIGH (ref 0.0–149.0)
VLDL: 57.6 mg/dL — ABNORMAL HIGH (ref 0.0–40.0)

## 2013-03-09 LAB — LDL CHOLESTEROL, DIRECT: Direct LDL: 143.9 mg/dL

## 2013-03-09 MED ORDER — TECHNETIUM TC 99M SESTAMIBI GENERIC - CARDIOLITE
11.0000 | Freq: Once | INTRAVENOUS | Status: AC | PRN
  Administered 2013-03-09: 11 via INTRAVENOUS

## 2013-03-09 MED ORDER — TECHNETIUM TC 99M SESTAMIBI GENERIC - CARDIOLITE
33.0000 | Freq: Once | INTRAVENOUS | Status: AC | PRN
  Administered 2013-03-09: 33 via INTRAVENOUS

## 2013-03-09 NOTE — Progress Notes (Addendum)
[facility] SITE 3 NUCLEAR MED [address] [place], [place] [address] [phone]    Cardiology Nuclear Med Study  Samuel Flowers is a 62 y.o. male     MRN : [id number]     DOB: 1950-12-04  Procedure Date: 03/09/2013  Nuclear Med Background Indication for Stress Test:  Evaluation for Ischemia and Graft Patency History:  CABG; MPI Cardiac Risk Factors: Family History - CAD, Hypertension and Lipids  Symptoms:  No symptoms indicated   Nuclear Pre-Procedure Caffeine/Decaff Intake:  None > 12 hrs NPO After: 8:00pm   Lungs:  clear O2 Sat: 94% on room air. IV 0.9% NS with Angio Cath:  20g  IV Site: R Antecubital x 1, tolerated well IV Started by:  Irean Hong, RN  Chest Size (in):  44 Cup Size: n/a  Height: 5\' 6"  (1.676 m)  Weight:  193 lb (87.544 kg)  BMI:  Body mass index is 31.17 kg/(m^2). Tech Comments:  N/A    Nuclear Med Study 1 or 2 day study: 1 day  Stress Test Type:  Stress  Reading MD: Willa Rough, MD  Order Authorizing Provider:  Olga Millers, MD  Resting Radionuclide: Technetium 56m Sestamibi  Resting Radionuclide Dose: 9.8 mCi   Stress Radionuclide:  Technetium 73m Sestamibi  Stress Radionuclide Dose: 33.0 mCi           Stress Protocol Rest HR: 50 Stress HR: 142  Rest BP: 130/67 Stress BP: 163/73  Exercise Time (min): 11:40 METS: 13.40   Predicted Max HR: 159 bpm % Max HR: 89.31 bpm Rate Pressure Product: 21308   Dose of Adenosine (mg):  n/a Dose of Lexiscan: n/a mg  Dose of Atropine (mg): n/a Dose of Dobutamine: n/a mcg/kg/min (at max HR)  Stress Test Technologist: Nelson Chimes, BS-ES  Nuclear Technologist:  Domenic Polite, CNMT     Rest Procedure:  Myocardial perfusion imaging was performed at rest 45 minutes following the intravenous administration of Technetium 42m Sestamibi. Rest ECG: Sinus bradycardia  Stress Procedure:  The patient exercised on the treadmill utilizing the Bruce Protocol for 11:40 minutes. The patient stopped  due to SOB, fatigue and denied any chest pain.  Technetium 94m Sestamibi was injected at peak exercise and myocardial perfusion imaging was performed after a brief delay. Stress ECG: No significant change from baseline ECG  QPS Raw Data Images:  Normal; no motion artifact; normal heart/lung ratio. Stress Images:  Normal homogeneous uptake in all areas of the myocardium. Rest Images:  Normal homogeneous uptake in all areas of the myocardium. Subtraction (SDS):  No evidence of ischemia. Transient Ischemic Dilatation (Normal <1.22):  0.87 Lung/Heart Ratio (Normal <0.45):  0.39  Quantitative Gated Spect Images QGS EDV:  65 ml QGS ESV:  23 ml  Impression Exercise Capacity:  There is very good exercise tolerance. BP Response:  Normal blood pressure response. Clinical Symptoms:  Shortness of breath and fatigue ECG Impression:  No significant ST segment change suggestive of ischemia. Comparison with Prior Nuclear Study: This study is compared to the report of the study from January, 2011.  Overall Impression:  Normal stress nuclear study. There is very good exercise tolerance. This is a low risk scan. There is no change from the report of January, 2011.  LV Ejection Fraction: 65%.  LV Wall Motion:  Normal Wall Motion.  Willa Rough, MD

## 2013-03-10 ENCOUNTER — Telehealth: Payer: Self-pay | Admitting: Cardiology

## 2013-03-10 ENCOUNTER — Other Ambulatory Visit: Payer: Self-pay | Admitting: *Deleted

## 2013-03-10 DIAGNOSIS — E785 Hyperlipidemia, unspecified: Secondary | ICD-10-CM

## 2013-03-10 DIAGNOSIS — I2510 Atherosclerotic heart disease of native coronary artery without angina pectoris: Secondary | ICD-10-CM

## 2013-03-10 MED ORDER — FENOFIBRATE 145 MG PO TABS: 145.0000 mg | ORAL_TABLET | Freq: Every day | ORAL | Status: DC

## 2013-03-10 MED ORDER — ROSUVASTATIN CALCIUM 10 MG PO TABS: 10.0000 mg | ORAL_TABLET | Freq: Every day | ORAL | Status: DC

## 2013-03-10 NOTE — Telephone Encounter (Signed)
Spoke with pt, he has not taken crestor or tricor in some time per his report. Refills sent to pharm. Will recheck labs in 3 months

## 2013-03-10 NOTE — Telephone Encounter (Signed)
Follow up ° ° °Pt returning your call °

## 2013-03-10 NOTE — Telephone Encounter (Signed)
Follow up    Return Samuel Flowers's call

## 2013-03-18 ENCOUNTER — Other Ambulatory Visit: Payer: Self-pay

## 2013-06-04 ENCOUNTER — Telehealth: Payer: Self-pay

## 2013-06-04 ENCOUNTER — Telehealth: Payer: Self-pay | Admitting: Internal Medicine

## 2013-06-04 MED ORDER — OSELTAMIVIR PHOSPHATE 75 MG PO CAPS: 75.0000 mg | ORAL_CAPSULE | Freq: Two times a day (BID) | ORAL | Status: DC

## 2013-06-04 MED ORDER — OSELTAMIVIR PHOSPHATE 75 MG PO CAPS: 75.0000 mg | ORAL_CAPSULE | Freq: Two times a day (BID) | ORAL | Status: AC

## 2013-06-04 NOTE — Telephone Encounter (Signed)
Patient called wanting to know if he could get Tamiflu called in stated his wife & daughter had  The flu now he has it, wants to know if he can get Tamiflu called in  Please Advise

## 2013-06-04 NOTE — Telephone Encounter (Signed)
done

## 2013-06-04 NOTE — Telephone Encounter (Signed)
The patient called and is hoping to get tamiflu called into his pharmacy.

## 2013-06-04 NOTE — Telephone Encounter (Signed)
Called mail order and c/x Tamiflu, resent to local pharmacy.

## 2013-06-04 NOTE — Telephone Encounter (Signed)
Pt last seen 04/2012, will need to be seen. Thanks

## 2013-06-04 NOTE — Telephone Encounter (Signed)
Patient called back stating he use the [facility] on [address] for the Tamiflu And that he would call back when he feels better to schedule an appointment

## 2014-01-20 ENCOUNTER — Telehealth: Payer: Self-pay | Admitting: Internal Medicine

## 2014-01-20 NOTE — Telephone Encounter (Signed)
Rec'd from [facility] forward 5 pages to Ward

## 2014-01-24 ENCOUNTER — Encounter: Payer: Self-pay | Admitting: Gastroenterology

## 2014-05-03 ENCOUNTER — Other Ambulatory Visit: Payer: Self-pay | Admitting: Cardiology

## 2014-05-04 ENCOUNTER — Telehealth: Payer: Self-pay | Admitting: Gastroenterology

## 2014-05-04 NOTE — Telephone Encounter (Signed)
Patient reports a history of abdominal pain and  c-diff colitis.  He has a few day history of abdominal pain and bloating.  He wants to be seen urgently. I explained that due to the holiday and the office closing early the first opening will be 05/10/14 2:00 with Amy Esterwood PA and that he should seek care at an Urgent Care or ER if his pain is severe.

## 2014-05-05 ENCOUNTER — Encounter (HOSPITAL_COMMUNITY): Payer: Self-pay | Admitting: Emergency Medicine

## 2014-05-05 ENCOUNTER — Emergency Department (HOSPITAL_COMMUNITY): Payer: BC Managed Care – PPO

## 2014-05-05 ENCOUNTER — Ambulatory Visit (INDEPENDENT_AMBULATORY_CARE_PROVIDER_SITE_OTHER): Payer: BC Managed Care – PPO | Admitting: Emergency Medicine

## 2014-05-05 ENCOUNTER — Emergency Department (HOSPITAL_COMMUNITY)
Admission: EM | Admit: 2014-05-05 | Discharge: 2014-05-05 | Disposition: A | Discharge status: Home / Self Care | Payer: BC Managed Care – PPO | Attending: Emergency Medicine | Admitting: Emergency Medicine

## 2014-05-05 ENCOUNTER — Ambulatory Visit (INDEPENDENT_AMBULATORY_CARE_PROVIDER_SITE_OTHER): Payer: BC Managed Care – PPO

## 2014-05-05 VITALS — BP 126/80 | HR 54 | Temp 98.2°F | Resp 18 | Ht 68.0 in | Wt 179.0 lb

## 2014-05-05 DIAGNOSIS — R140 Abdominal distension (gaseous): Secondary | ICD-10-CM

## 2014-05-05 DIAGNOSIS — N400 Enlarged prostate without lower urinary tract symptoms: Secondary | ICD-10-CM | POA: Diagnosis not present

## 2014-05-05 DIAGNOSIS — Z7982 Long term (current) use of aspirin: Secondary | ICD-10-CM | POA: Insufficient documentation

## 2014-05-05 DIAGNOSIS — Z8619 Personal history of other infectious and parasitic diseases: Secondary | ICD-10-CM | POA: Insufficient documentation

## 2014-05-05 DIAGNOSIS — R109 Unspecified abdominal pain: Secondary | ICD-10-CM

## 2014-05-05 DIAGNOSIS — R339 Retention of urine, unspecified: Secondary | ICD-10-CM

## 2014-05-05 DIAGNOSIS — K589 Irritable bowel syndrome without diarrhea: Secondary | ICD-10-CM | POA: Insufficient documentation

## 2014-05-05 DIAGNOSIS — E785 Hyperlipidemia, unspecified: Secondary | ICD-10-CM | POA: Insufficient documentation

## 2014-05-05 DIAGNOSIS — R1030 Lower abdominal pain, unspecified: Secondary | ICD-10-CM

## 2014-05-05 DIAGNOSIS — Z79899 Other long term (current) drug therapy: Secondary | ICD-10-CM | POA: Insufficient documentation

## 2014-05-05 DIAGNOSIS — K5732 Diverticulitis of large intestine without perforation or abscess without bleeding: Secondary | ICD-10-CM

## 2014-05-05 DIAGNOSIS — Z951 Presence of aortocoronary bypass graft: Secondary | ICD-10-CM | POA: Insufficient documentation

## 2014-05-05 DIAGNOSIS — J45909 Unspecified asthma, uncomplicated: Secondary | ICD-10-CM | POA: Insufficient documentation

## 2014-05-05 DIAGNOSIS — I2510 Atherosclerotic heart disease of native coronary artery without angina pectoris: Secondary | ICD-10-CM | POA: Insufficient documentation

## 2014-05-05 LAB — BASIC METABOLIC PANEL
ANION GAP: 5 (ref 5–15)
BUN: 21 mg/dL (ref 6–23)
CALCIUM: 9.2 mg/dL (ref 8.4–10.5)
CO2: 29 mmol/L (ref 19–32)
Chloride: 105 mEq/L (ref 96–112)
Creatinine, Ser: 1.24 mg/dL (ref 0.50–1.35)
GFR, EST AFRICAN AMERICAN: 70 mL/min — AB (ref >90)
GFR, EST NON AFRICAN AMERICAN: 61 mL/min — AB (ref >90)
GLUCOSE: 102 mg/dL — AB (ref 70–99)
POTASSIUM: 4.3 mmol/L (ref 3.5–5.1)
Sodium: 139 mmol/L (ref 135–145)

## 2014-05-05 LAB — CBC WITH DIFFERENTIAL/PLATELET
BASOS ABS: 0 10*3/uL (ref 0.0–0.1)
Basophils Relative: 0 % (ref 0–1)
EOS ABS: 0.3 10*3/uL (ref 0.0–0.7)
EOS PCT: 5 % (ref 0–5)
HCT: 40.1 % (ref 39.0–52.0)
Hemoglobin: 13.6 g/dL (ref 13.0–17.0)
Lymphocytes Relative: 24 % (ref 12–46)
Lymphs Abs: 1.7 10*3/uL (ref 0.7–4.0)
MCH: 32 pg (ref 26.0–34.0)
MCHC: 33.9 g/dL (ref 30.0–36.0)
MCV: 94.4 fL (ref 78.0–100.0)
Monocytes Absolute: 0.7 10*3/uL (ref 0.1–1.0)
Monocytes Relative: 10 % (ref 3–12)
NEUTROS PCT: 61 % (ref 43–77)
Neutro Abs: 4.2 10*3/uL (ref 1.7–7.7)
PLATELETS: 197 10*3/uL (ref 150–400)
RBC: 4.25 MIL/uL (ref 4.22–5.81)
RDW: 13.5 % (ref 11.5–15.5)
WBC: 6.9 10*3/uL (ref 4.0–10.5)

## 2014-05-05 LAB — URINALYSIS, ROUTINE W REFLEX MICROSCOPIC
Bilirubin Urine: NEGATIVE
Glucose, UA: NEGATIVE mg/dL
Hgb urine dipstick: NEGATIVE
KETONES UR: NEGATIVE mg/dL
LEUKOCYTES UA: NEGATIVE
NITRITE: NEGATIVE
Protein, ur: NEGATIVE mg/dL
Specific Gravity, Urine: 1.012 (ref 1.005–1.030)
UROBILINOGEN UA: 0.2 mg/dL (ref 0.0–1.0)
pH: 6 (ref 5.0–8.0)

## 2014-05-05 MED ORDER — METRONIDAZOLE IN NACL 5-0.79 MG/ML-% IV SOLN
500.0000 mg | Freq: Once | INTRAVENOUS | Status: AC
  Administered 2014-05-05: 500 mg via INTRAVENOUS
  Filled 2014-05-05: qty 100

## 2014-05-05 MED ORDER — IOHEXOL 300 MG/ML  SOLN
50.0000 mL | Freq: Once | INTRAMUSCULAR | Status: AC | PRN
  Administered 2014-05-05: 50 mL via ORAL

## 2014-05-05 MED ORDER — CIPROFLOXACIN IN D5W 400 MG/200ML IV SOLN
400.0000 mg | Freq: Once | INTRAVENOUS | Status: AC
  Administered 2014-05-05: 400 mg via INTRAVENOUS
  Filled 2014-05-05: qty 200

## 2014-05-05 MED ORDER — METRONIDAZOLE 500 MG PO TABS: 500.0000 mg | ORAL_TABLET | Freq: Two times a day (BID) | ORAL | Status: DC

## 2014-05-05 MED ORDER — CIPROFLOXACIN HCL 500 MG PO TABS: 500.0000 mg | ORAL_TABLET | Freq: Two times a day (BID) | ORAL | Status: DC

## 2014-05-05 MED ORDER — SODIUM CHLORIDE 0.9 % IV SOLN
INTRAVENOUS | Status: DC
Start: 2014-05-05 — End: 2014-05-05
  Administered 2014-05-05: 13:00:00 via INTRAVENOUS

## 2014-05-05 MED ORDER — OXYCODONE-ACETAMINOPHEN 5-325 MG PO TABS: 1.0000 | ORAL_TABLET | ORAL | Status: DC | PRN

## 2014-05-05 MED ORDER — IOHEXOL 300 MG/ML  SOLN
100.0000 mL | Freq: Once | INTRAMUSCULAR | Status: AC | PRN
  Administered 2014-05-05: 100 mL via INTRAVENOUS

## 2014-05-05 NOTE — Discharge Instructions (Signed)
Diverticulitis Diverticulitis is inflammation or infection of small pouches in your colon that form when you have a condition called diverticulosis. The pouches in your colon are called diverticula. Your colon, or large intestine, is where water is absorbed and stool is formed. Complications of diverticulitis can include:  Bleeding.  Severe infection.  Severe pain.  Perforation of your colon.  Obstruction of your colon. CAUSES  Diverticulitis is caused by bacteria. Diverticulitis happens when stool becomes trapped in diverticula. This allows bacteria to grow in the diverticula, which can lead to inflammation and infection. RISK FACTORS People with diverticulosis are at risk for diverticulitis. Eating a diet that does not include enough fiber from fruits and vegetables may make diverticulitis more likely to develop. SYMPTOMS  Symptoms of diverticulitis may include:  Abdominal pain and tenderness. The pain is normally located on the left side of the abdomen, but may occur in other areas.  Fever and chills.  Bloating.  Cramping.  Nausea.  Vomiting.  Constipation.  Diarrhea.  Blood in your stool. DIAGNOSIS  Your health care provider will ask you about your medical history and do a physical exam. You may need to have tests done because many medical conditions can cause the same symptoms as diverticulitis. Tests may include:  Blood tests.  Urine tests.  Imaging tests of the abdomen, including X-rays and CT scans. When your condition is under control, your health care provider may recommend that you have a colonoscopy. A colonoscopy can show how severe your diverticula are and whether something else is causing your symptoms. TREATMENT  Most cases of diverticulitis are mild and can be treated at home. Treatment may include: 1. Taking over-the-counter pain medicines. 2. Following a clear liquid diet. 3. Taking antibiotic medicines by mouth for 7-10 days. More severe cases  may be treated at a hospital. Treatment may include: 1. Not eating or drinking. 2. Taking prescription pain medicine. 3. Receiving antibiotic medicines through an IV tube. 4. Receiving fluids and nutrition through an IV tube. 5. Surgery. HOME CARE INSTRUCTIONS   Follow your health care provider's instructions carefully.  Follow a full liquid diet or other diet as directed by your health care provider. After your symptoms improve, your health care provider may tell you to change your diet. He or she may recommend you eat a high-fiber diet. Fruits and vegetables are good sources of fiber. Fiber makes it easier to pass stool.  Take fiber supplements or probiotics as directed by your health care provider.  Only take medicines as directed by your health care provider.  Keep all your follow-up appointments. SEEK MEDICAL CARE IF:   Your pain does not improve.  You have a hard time eating food.  Your bowel movements do not return to normal. SEEK IMMEDIATE MEDICAL CARE IF:   Your pain becomes worse.  Your symptoms do not get better.  Your symptoms suddenly get worse.  You have a fever.  You have repeated vomiting.  You have bloody or black, tarry stools. MAKE SURE YOU:   Understand these instructions.  Will watch your condition.  Will get help right away if you are not doing well or get worse. Document Released: 02/06/2005 Document Revised: 05/04/2013 Document Reviewed: 03/24/2013 [facility] Patient Information 2015 [place], [place]. This information is not intended to replace advice given to you by your health care provider. Make sure you discuss any questions you have with your health care provider. Return here at once for fever, worsening abdominal pain, vomiting, or any other problems.  Call your urologist on Monday to schedule a follow-up visit and keep your appointment with your gastroenterologist for next week  Acute Urinary Retention Acute urinary retention is the  temporary inability to urinate. This is a common problem in older men. As men age their prostates become larger and block the flow of urine from the bladder. This is usually a problem that has come on gradually.  HOME CARE INSTRUCTIONS If you are sent home with a Foley catheter and a drainage system, you will need to discuss the best course of action with your health care provider. While the catheter is in, maintain a good intake of fluids. Keep the drainage bag emptied and lower than your catheter. This is so that contaminated urine will not flow back into your bladder, which could lead to a urinary tract infection. There are two main types of drainage bags. One is a large bag that usually is used at night. It has a good capacity that will allow you to sleep through the night without having to empty it. The second type is called a leg bag. It has a smaller capacity, so it needs to be emptied more frequently. However, the main advantage is that it can be attached by a leg strap and can go underneath your clothing, allowing you the freedom to move about or leave your home. Only take over-the-counter or prescription medicines for pain, discomfort, or fever as directed by your health care provider.  SEEK MEDICAL CARE IF:  You develop a low-grade fever.  You experience spasms or leakage of urine with the spasms. SEEK IMMEDIATE MEDICAL CARE IF:   You develop chills or fever.  Your catheter stops draining urine.  Your catheter falls out.  You start to develop increased bleeding that does not respond to rest and increased fluid intake. MAKE SURE YOU:  Understand these instructions.  Will watch your condition.  Will get help right away if you are not doing well or get worse. Document Released: 08/05/2000 Document Revised: 05/04/2013 Document Reviewed: 10/08/2012 [facility] Patient Information 2015 [place], [place]. This information is not intended to replace advice given to you by your health care  provider. Make sure you discuss any questions you have with your health care provider. Foley Catheter Care A Foley catheter is a soft, flexible tube that is placed into the bladder to drain urine. A Foley catheter may be inserted if:  You leak urine or are not able to control when you urinate (urinary incontinence).  You are not able to urinate when you need to (urinary retention).  You had prostate surgery or surgery on the genitals.  You have certain medical conditions, such as multiple sclerosis, dementia, or a spinal cord injury. If you are going home with a Foley catheter in place, follow the instructions below. TAKING CARE OF THE CATHETER  Wash your hands with soap and water.  Using mild soap and warm water on a clean washcloth:  Clean the area on your body closest to the catheter insertion site using a circular motion, moving away from the catheter. Never wipe toward the catheter because this could sweep bacteria up into the urethra and cause infection.  Remove all traces of soap. Pat the area dry with a clean towel. For males, reposition the foreskin.  Attach the catheter to your leg so there is no tension on the catheter. Use adhesive tape or a leg strap. If you are using adhesive tape, remove any sticky residue left behind by the previous tape  you used.  Keep the drainage bag below the level of the bladder, but keep it off the floor.  Check throughout the day to be sure the catheter is working and urine is draining freely. Make sure the tubing does not become kinked.  Do not pull on the catheter or try to remove it. Pulling could damage internal tissues. TAKING CARE OF THE DRAINAGE BAGS You will be given two drainage bags to take home. One is a large overnight drainage bag, and the other is a smaller leg bag that fits underneath clothing. You may wear the overnight bag at any time, but you should never wear the smaller leg bag at night. Follow the instructions below for how to  empty, change, and clean your drainage bags. Emptying the Drainage Bag You must empty your drainage bag when it is  - full or at least 2-3 times a day.  Wash your hands with soap and water.  Keep the drainage bag below your hips, below the level of your bladder. This stops urine from going back into the tubing and into your bladder.  Hold the dirty bag over the toilet or a clean container.  Open the pour spout at the bottom of the bag and empty the urine into the toilet or container. Do not let the pour spout touch the toilet, container, or any other surface. Doing so can place bacteria on the bag, which can cause an infection.  Clean the pour spout with a gauze pad or cotton ball that has rubbing alcohol on it.  Close the pour spout.  Attach the bag to your leg with adhesive tape or a leg strap.  Wash your hands well. Changing the Drainage Bag Change your drainage bag once a month or sooner if it starts to smell bad or look dirty. Below are steps to follow when changing the drainage bag. 4. Wash your hands with soap and water. 5. Pinch off the rubber catheter so that urine does not spill out. 6. Disconnect the catheter tube from the drainage tube at the connection valve. Do not let the tubes touch any surface. 7. Clean the end of the catheter tube with an alcohol wipe. Use a different alcohol wipe to clean the end of the drainage tube. 8. Connect the catheter tube to the drainage tube of the clean drainage bag. 9. Attach the new bag to the leg with adhesive tape or a leg strap. Avoid attaching the new bag too tightly. 10. Wash your hands well. Cleaning the Drainage Bag 6. Wash your hands with soap and water. 7. Wash the bag in warm, soapy water. 8. Rinse the bag thoroughly with warm water. 9. Fill the bag with a solution of white vinegar and water (1 cup vinegar to 1 qt warm water [.2 L vinegar to 1 L warm water]). Close the bag and soak it for 30 minutes in the solution. 10. Rinse  the bag with warm water. 11. Hang the bag to dry with the pour spout open and hanging downward. 12. Store the clean bag (once it is dry) in a clean plastic bag. 13. Wash your hands well. PREVENTING INFECTION  Wash your hands before and after handling your catheter.  Take showers daily and wash the area where the catheter enters your body. Do not take baths. Replace wet leg straps with dry ones, if this applies.  Do not use powders, sprays, or lotions on the genital area. Only use creams, lotions, or ointments as directed by  your caregiver.  For females, wipe from front to back after each bowel movement.  Drink enough fluids to keep your urine clear or pale yellow unless you have a fluid restriction.  Do not let the drainage bag or tubing touch or lie on the floor.  Wear cotton underwear to absorb moisture and to keep your skin drier. SEEK MEDICAL CARE IF:   Your urine is cloudy or smells unusually bad.  Your catheter becomes clogged.  You are not draining urine into the bag or your bladder feels full.  Your catheter starts to leak. SEEK IMMEDIATE MEDICAL CARE IF:   You have pain, swelling, redness, or pus where the catheter enters the body.  You have pain in the abdomen, legs, lower back, or bladder.  You have a fever.  You see blood fill the catheter, or your urine is pink or red.  You have nausea, vomiting, or chills.  Your catheter gets pulled out. MAKE SURE YOU:   Understand these instructions.  Will watch your condition.  Will get help right away if you are not doing well or get worse. Document Released: 04/29/2005 Document Revised: 09/13/2013 Document Reviewed: 04/20/2012 [facility] Patient Information 2015 [place], [place]. This information is not intended to replace advice given to you by your health care provider. Make sure you discuss any questions you have with your health care provider.

## 2014-05-05 NOTE — ED Notes (Addendum)
Pt here via [facility] from [facility] c/o abdominal pain. Distention present. UC recommends [place]. Relief with urination but denies retention. Pt reports that is having normal bowel movements but more frequent. Abd  XRAY performed showing tissue mass.

## 2014-05-05 NOTE — Progress Notes (Signed)
MRN: [id number] DOB: August 18, 1950  Subjective:   Samuel Flowers is a 63 y.o. male presenting for 4 days of abdominal pain and bloating in his lower abdomen.  The first 2 days were a change in bowel movements where he was going in less quantity, though, passing stools 1-2x per day.  He reports no blood in the stool or diarrhea.  He took a natural relaxer for relief on day 2, which helped but he continues to have the abdominal pain for these last two days, rating the pain 7/10.  He is able to sleep and continue his exercise in this discomfort.  Today, it is rated a 3/10.  He has no nausea, vomiting, fever, back pain, or dizziness.  He reports no difficulty with urination; polyuria, dribbling, or dysuria.  He adds that his abdomen generally feels distended but firm at times.   He drinks more carbonated water than normal water.  He has had bladder issues in the past which is followed by a urologist.   He reports no alcohol use.  When asked about IBS that is listed in his history, he reports that he was never told he has IBS, but has a hx of colitis and c. Diff.    He reports that his abdomen has always felt distended but firm and may occasional have abdominal pain which resolves.   Samuel Flowers has a current medication list which includes the following prescription(s): aspirin, crestor, fenofibrate, tamsulosin, and doxycycline.  He has No Known Allergies.  Samuel Flowers  has a past medical history of CAD (coronary artery disease); Rhinitis, allergic; Acute asthmatic bronchitis; Hyperlipemia; Hypothyroidism; IBS (irritable bowel syndrome); BPH (benign prostatic hypertrophy); Fatty liver; Nonspecific colitis; Allergy; Colitis; and C. difficile colitis. Also  has past surgical history that includes Coronary artery bypass graft (2002); Lumbar laminectomy (1990); and Eye surgery.  ROS As in subjective.  Objective:   Vitals: BP 126/80 mmHg  Pulse 54  Temp(Src) 98.2 F (36.8 C) (Oral)  Resp 18  Ht 5\' 8"  (1.727 m)   Wt 179 lb (81.194 kg)  BMI 27.22 kg/m2  SpO2 97%  Physical Exam  Constitutional: He is well-developed, well-nourished, and in no distress. No distress.  HENT:  Head: Normocephalic and atraumatic.  Cardiovascular: Normal rate, regular rhythm and normal heart sounds.  Exam reveals no gallop and no friction rub.   No murmur heard. Pulmonary/Chest: Effort normal and breath sounds normal. No respiratory distress. He has no wheezes.  Abdominal: He exhibits distension. He exhibits no abdominal bruit, no ascites, no pulsatile midline mass and no mass. Bowel sounds are not hypoactive. There is tenderness (.  ) in the right lower quadrant and suprapubic area.  29cm mass extending superiorly at the abdomen.   Hypoactive bowel sounds.  Diffused tenderness along the lower quadrants, however increased at the RLQ.  Hypoactive bowel sounds.  Dull percussion.  No ascites noted or fluid waves.  No pulsatile mass or bruits noted.  Normal femoral pulses.  Skin: Skin is warm, dry and intact.  Psychiatric: Mood, memory, affect and judgment normal.   [facility] reading (PRIMARY) by  Dr. Everlene Farrier. Ground glass appearance consistent with fluid.  Multiple gallstones.  No obstruction.     Assessment and Plan :  63 year old male is here today with PMH above, is here today for complaint of 4 day abdominal distension and abdominal pain.  Diff dx. Includes distended bladder d/t bladder obstruction, ruptured aneurysm (though unlikely), bowel obstruction, or malignancy.    Patient has  a hx of bladder sx as he sees a urologist.  The character of the abdomen distention is very localized and is more suspicious to bladder etiology or possible mass.  Not being able to rule-out vasculature cause with cardiac hx, malignancy, nor resolve bladder distension here, this distention needs to be worked up closely and acutely.  EMS called for transportation.  Bloating - Plan: DG Abd Acute W/Chest  Distended abdomen - Plan: DG Abd Acute  W/Chest  Lower abdominal pain - Plan: DG Abd Acute W/Chest  Ivar Drape, PA-C [facility] 12/24/20159:44 AM

## 2014-05-05 NOTE — ED Provider Notes (Signed)
CSN: [id number]     Arrival date & time 05/05/14  4166 History   First MD Initiated Contact with Patient 05/05/14 1015     Chief Complaint  Patient presents with  . Abdominal Pain     (Consider location/radiation/quality/duration/timing/severity/associated sxs/prior Treatment) HPI Comments: Patient here complaining of abdominal distention that has been worse for the past 2 days. Distention is mostly suprapubic and relieved after he urinates. Was seen at urgent care prior to arrival and they did a plain abdominal series was did show a fluid filled mass in the suprapubic area consistent with a distended bladder. He denies any dysuria. No flank pain. No fever or chills. No vomiting or diarrhea. Nothing makes the symptoms worse. Does have a history of BPH and takes Flomax.  Patient is a 63 y.o. male presenting with abdominal pain. The history is provided by the patient.  Abdominal Pain   Past Medical History  Diagnosis Date  . CAD (coronary artery disease)   . Rhinitis, allergic   . Acute asthmatic bronchitis   . Hyperlipemia   . Hypothyroidism   . IBS (irritable bowel syndrome)   . BPH (benign prostatic hypertrophy)     mild  . Fatty liver   . Nonspecific colitis   . Allergy   . Colitis   . C. difficile colitis    Past Surgical History  Procedure Laterality Date  . Coronary artery bypass graft  2002  . Lumbar laminectomy  1990  . Eye surgery     Family History  Problem Relation Age of Onset  . Colon polyps Father   . Coronary artery disease Father   . Heart disease Father   . Diabetes Father   . Hyperlipidemia Father   . Coronary artery disease Mother   . Mental illness Mother   . Colon cancer Paternal Aunt   . Irritable bowel syndrome Neg Hx   . Stroke Neg Hx   . Alcohol abuse Neg Hx   . Cancer Neg Hx   . Depression Neg Hx   . Early death Neg Hx   . Hypertension Neg Hx    History  Substance Use Topics  . Smoking status: Never Smoker   . Smokeless tobacco:  Never Used  . Alcohol Use: 1.5 oz/week    3 Not specified per week     Comment: occasionally    Review of Systems  Gastrointestinal: Positive for abdominal pain.  All other systems reviewed and are negative.     Allergies  Review of patient's allergies indicates no known allergies.  Home Medications   Prior to Admission medications   Medication Sig Start Date End Date Taking? Authorizing Provider  aspirin 81 MG tablet Take 81 mg by mouth daily.    Historical Provider, MD  CRESTOR 10 MG tablet TAKE 1 TABLET DAILY 05/03/14   Lelon Perla, MD  doxycycline (VIBRA-TABS) 100 MG tablet Take 1 tablet (100 mg total) by mouth 2 (two) times daily. Patient not taking: Reported on 05/05/2014 04/23/12   Darreld Mclean, MD  fenofibrate (TRICOR) 145 MG tablet TAKE 1 TABLET DAILY 05/03/14   Lelon Perla, MD  tamsulosin (FLOMAX) 0.4 MG CAPS TAKE 1 CAPSULE (0.4 MG TOTAL) TWICE A DAY 09/20/12   Janith Lima, MD   BP 164/90 mmHg  Pulse 53  Temp(Src) 98 F (36.7 C) (Oral)  Resp 14  SpO2 99% Physical Exam  Constitutional: He is oriented to person, place, and time. He appears well-developed and well-nourished.  Non-toxic appearance. No distress.  HENT:  Head: Normocephalic and atraumatic.  Eyes: Conjunctivae, EOM and lids are normal. Pupils are equal, round, and reactive to light.  Neck: Normal range of motion. Neck supple. No tracheal deviation present. No thyroid mass present.  Cardiovascular: Normal rate, regular rhythm and normal heart sounds.  Exam reveals no gallop.   No murmur heard. Pulmonary/Chest: Effort normal and breath sounds normal. No stridor. No respiratory distress. He has no decreased breath sounds. He has no wheezes. He has no rhonchi. He has no rales.  Abdominal: Soft. Normal appearance and bowel sounds are normal. He exhibits no distension. There is tenderness in the suprapubic area. There is no rigidity, no rebound, no guarding and no CVA tenderness.     Musculoskeletal: Normal range of motion. He exhibits no edema or tenderness.  Neurological: He is alert and oriented to person, place, and time. He has normal strength. No cranial nerve deficit or sensory deficit. GCS eye subscore is 4. GCS verbal subscore is 5. GCS motor subscore is 6.  Skin: Skin is warm and dry. No abrasion and no rash noted.  Psychiatric: He has a normal mood and affect. His speech is normal and behavior is normal.  Nursing note and vitals reviewed.   ED Course  Procedures (including critical care time) Labs Review Labs Reviewed  URINE CULTURE  CBC WITH DIFFERENTIAL  BASIC METABOLIC PANEL  URINALYSIS, ROUTINE W REFLEX MICROSCOPIC    Imaging Review Dg Abd Acute W/chest  05/05/2014   CLINICAL DATA:  Distended abdomen and bloating  EXAM: ACUTE ABDOMEN SERIES (ABDOMEN 2 VIEW & CHEST 1 VIEW)  COMPARISON:  Chest x-ray 06/25/2011  FINDINGS: Changes of CABG. Heart size upper normal. Negative for heart failure. Negative for pneumonia. Mild atelectasis in the bases.  Cholelithiasis.  Normal bowel gas pattern.  No renal calculi  Large soft tissue mass arising from the pelvis into the abdomen consistent with markedly distended urinary bladder based on the prior CT of 01/19/2010.  IMPRESSION: Cholelithiasis  Large soft tissue mass in the mid abdomen consistent with a markedly enlarged urinary bladder. Correlate with exam and Foley catheter.  These results will be called to the ordering clinician or representative by the Radiologist Assistant, and communication documented in the PACS or zVision Dashboard.   Electronically Signed   By: Franchot Gallo M.D.   On: 05/05/2014 09:53     EKG Interpretation None      MDM   Final diagnoses:  None   patient had Foley catheter placed by nursing staff and drained 3.5  L of urine. No evidence of renal failure noted. No infection noted on patient's urinalysis.  Patient given Cipro and Flagyl for diverticulitis seen on CT  scan.  Patient to follow-up with his urologist next week as well as his gastroenterologist. Return precautions given    Leota Jacobsen, MD 05/05/14 1530

## 2014-05-05 NOTE — ED Notes (Signed)
Change in Urine Color reported to Dr. Zenia Resides

## 2014-05-05 NOTE — ED Notes (Signed)
Bed: JH41 Expected date:  Expected time:  Means of arrival:  Comments: EMS- abdominal distention

## 2014-05-05 NOTE — ED Notes (Signed)
Foley Catheter Secured to leg with tape.

## 2014-05-05 NOTE — ED Notes (Signed)
Dr Zenia Resides notified that pt has urinary catheter output of over 3500 cc and is still draining.

## 2014-05-05 NOTE — ED Notes (Signed)
MD at bedside. 

## 2014-05-06 LAB — URINE CULTURE
COLONY COUNT: NO GROWTH
Culture: NO GROWTH

## 2014-05-10 ENCOUNTER — Ambulatory Visit (INDEPENDENT_AMBULATORY_CARE_PROVIDER_SITE_OTHER): Payer: BC Managed Care – PPO | Admitting: Physician Assistant

## 2014-05-10 ENCOUNTER — Encounter: Payer: Self-pay | Admitting: Physician Assistant

## 2014-05-10 ENCOUNTER — Other Ambulatory Visit (INDEPENDENT_AMBULATORY_CARE_PROVIDER_SITE_OTHER): Payer: 59

## 2014-05-10 ENCOUNTER — Telehealth: Payer: Self-pay | Admitting: Physician Assistant

## 2014-05-10 VITALS — BP 128/70 | HR 68 | Ht 66.5 in | Wt 169.0 lb

## 2014-05-10 DIAGNOSIS — N3090 Cystitis, unspecified without hematuria: Secondary | ICD-10-CM

## 2014-05-10 DIAGNOSIS — B999 Unspecified infectious disease: Secondary | ICD-10-CM

## 2014-05-10 LAB — CBC WITH DIFFERENTIAL/PLATELET
Basophils Absolute: 0 10*3/uL (ref 0.0–0.1)
Basophils Relative: 0.4 % (ref 0.0–3.0)
Eosinophils Absolute: 0.4 10*3/uL (ref 0.0–0.7)
Eosinophils Relative: 4.9 % (ref 0.0–5.0)
HCT: 42.3 % (ref 39.0–52.0)
Hemoglobin: 13.8 g/dL (ref 13.0–17.0)
LYMPHS PCT: 13.9 % (ref 12.0–46.0)
Lymphs Abs: 1.2 10*3/uL (ref 0.7–4.0)
MCHC: 32.7 g/dL (ref 30.0–36.0)
MCV: 94.6 fl (ref 78.0–100.0)
MONOS PCT: 9 % (ref 3.0–12.0)
Monocytes Absolute: 0.8 10*3/uL (ref 0.1–1.0)
Neutro Abs: 6.3 10*3/uL (ref 1.4–7.7)
Neutrophils Relative %: 71.8 % (ref 43.0–77.0)
PLATELETS: 250 10*3/uL (ref 150.0–400.0)
RBC: 4.48 Mil/uL (ref 4.22–5.81)
RDW: 13.3 % (ref 11.5–15.5)
WBC: 8.8 10*3/uL (ref 4.0–10.5)

## 2014-05-10 LAB — BASIC METABOLIC PANEL
BUN: 18 mg/dL (ref 6–23)
CO2: 29 mEq/L (ref 19–32)
Calcium: 9.5 mg/dL (ref 8.4–10.5)
Chloride: 104 mEq/L (ref 96–112)
Creatinine, Ser: 1.5 mg/dL (ref 0.4–1.5)
GFR: 51.07 mL/min — ABNORMAL LOW (ref >60.00)
GLUCOSE: 96 mg/dL (ref 70–99)
POTASSIUM: 4.6 meq/L (ref 3.5–5.1)
Sodium: 141 mEq/L (ref 135–145)

## 2014-05-10 MED ORDER — SACCHAROMYCES BOULARDII 250 MG PO CAPS: 250.0000 mg | ORAL_CAPSULE | Freq: Two times a day (BID) | ORAL | Status: DC

## 2014-05-10 NOTE — Progress Notes (Signed)
Patient ID: Nitish Roes, male   DOB: 1950-05-28, 63 y.o.   MRN: [id number]   Subjective:    Patient ID: Donell Tomkins, male    DOB: 01-24-1951, 63 y.o.   MRN: [id number]  HPI Jaiden is a 63 year old white male known to Dr. Fuller Plan. Her gone colonoscopy in 2010 which was a normal exam with exception of internal hemorrhoids. He has remote history of C. difficile colitis in 2004 for which she was hospitalized. He was seen here in 2011 with acute abdominal pain and underwent CT imaging with findings of epiploic appendicitis of the sigmoid colon. Is also noted to have a chronically enlarged bladder and gallstones. He comes in today after an ER visit on 05/05/2014 with a 2-3 day history of progressive abdominal pain and distention. He says he had an inflamed "" feeling similar to his remote colitis. Was also having some difficulty with constipation had taken a laxative was passing some small amounts of stool but felt he was unable to evacuate his bowel. When seen in the emergency room he was CT beginning found to have massive bladder distention. Foley was placed and he had immediate drainage of 3.5 L of urine. The CT scan was consistent with a neurogenic bladder there were noted to be diverticuli along the dome of the bladder  And to the the right of midline there may be a focal tiny contained perforation of the bladder , and left ureter dilated He also had some peri colonic inflammation but no evidence of diverticuli in the area of inflammation. This was felt consistent with microperforation of the bladder versus a possible focal epiploic appendicitis. Interestingly CBC was normal as were his chemistries, UA and culture are both negative.Marland Kitchen He was started on a course of Cipro and Flagyl and asked to follow up with urology and Gertie Fey. He is now been on antibiotics for 6 days and says that his pain has improved but not resolved. He still has the catheter indwelling and has no feeling of bladder distention. He has some  discomfort in his left abdomen especially after eating. He still feels like she's not having very good bowel movements. His had no documented fever chills or sweats. Patient does have an appointment with Dr. Elenora Gamma who he has seen in the past tomorrow. He and his wife have a lot of questions about the CT findings  Review of Systems Pertinent positive and negative review of systems were noted in the above HPI section.  All other review of systems was otherwise negative.  Outpatient Encounter Prescriptions as of 05/10/2014  Medication Sig  . aspirin 81 MG tablet Take 81 mg by mouth daily.  Marland Kitchen atropine 1 % ophthalmic solution   . Bromfenac Sodium (PROLENSA) 0.07 % SOLN Place 1 drop into the left eye daily.  . ciprofloxacin (CIPRO) 500 MG tablet Take 1 tablet (500 mg total) by mouth 2 (two) times daily.  . clopidogrel (PLAVIX) 75 MG tablet Take 75 mg by mouth daily.  . CRESTOR 10 MG tablet TAKE 1 TABLET DAILY  . diphenhydrAMINE (BENADRYL) 25 mg capsule Take 25 mg by mouth every 6 (six) hours as needed for itching or allergies.  Marland Kitchen doxycycline (VIBRA-TABS) 100 MG tablet Take 1 tablet (100 mg total) by mouth 2 (two) times daily.  . fenofibrate (TRICOR) 145 MG tablet TAKE 1 TABLET DAILY  . loratadine (CLARITIN) 10 MG tablet Take 10 mg by mouth daily.  . metroNIDAZOLE (FLAGYL) 500 MG tablet Take 1 tablet (500 mg total) by mouth  2 (two) times daily.  . prednisoLONE acetate (PRED FORTE) 1 % ophthalmic suspension Place 1 drop into the left eye 2 (two) times daily.  . Probiotic Product (PROBIOTIC DAILY) CAPS Take 2 capsules by mouth once.  . tamsulosin (FLOMAX) 0.4 MG CAPS TAKE 1 CAPSULE (0.4 MG TOTAL) TWICE A DAY  . [DISCONTINUED] oxyCODONE-acetaminophen (PERCOCET/ROXICET) 5-325 MG per tablet Take 1-2 tablets by mouth every 4 (four) hours as needed for severe pain.  Marland Kitchen saccharomyces boulardii (FLORASTOR) 250 MG capsule Take 1 capsule (250 mg total) by mouth 2 (two) times daily.   No Known  Allergies Patient Active Problem List   Diagnosis Date Noted  . Routine general medical examination at a health care facility 10/30/2011  . HYPERTENSION 01/18/2010  . HYPOTHYROIDISM 05/12/2008  . HYPERLIPIDEMIA 05/12/2008  . CORONARY ARTERY DISEASE 05/12/2008  . ALLERGIC RHINITIS 05/12/2008  . Irritable bowel syndrome 05/12/2008  . BENIGN PROSTATIC HYPERTROPHY, MILD, HX OF 05/12/2008   History   Social History  . Marital Status: Married    Spouse Name: N/A    Number of Children: 2  . Years of Education: N/A   Occupational History  . Retired    Social History Main Topics  . Smoking status: Never Smoker   . Smokeless tobacco: Never Used  . Alcohol Use: 1.5 oz/week    3 Not specified per week     Comment: occasionally  . Drug Use: No  . Sexual Activity: Yes   Other Topics Concern  . Not on file   Social History Narrative    Mr. Heckard family history includes Colon cancer in his paternal aunt; Colon polyps in his father; Coronary artery disease in his father and mother; Diabetes in his father; Heart disease in his father; Hyperlipidemia in his father; Mental illness in his mother. There is no history of Irritable bowel syndrome, Stroke, Alcohol abuse, Cancer, Depression, Early death, or Hypertension.      Objective:    Filed Vitals:   05/10/14 1406  BP: 128/70  Pulse: 68    Physical Exam  well-developed white male in no acute distress, accompanied by his wife vital signs as above height 5 foot 6 weight 169. HEENT ;nontraumatic normocephalic EOMI PERRLA sclera anicteric, Supple, Cardiovascular; regular rate and rhythm with S1-S2 no murmur rub or gallop, Pulmonary; clear bilaterally, Abdomen; soft nondistended Foley catheter with leg bag in place he has some mild tenderness in the suprapubic area mid abdomen and left lower quadrant no guarding or rebound no palpable mass or hepatosplenomegaly, Rectal; exam not done, Ext; no clubbing cyanosis or edema skin warm dry,  Psych ;mood and affect appropriate     Assessment & Plan:   #68 63 year old male with acute abdominal pain onset 05/03/2014 with the emergency room workup revealing massive bladder distention and probable tiny focal perforation of the dome of the bladder in an area of diverticuli of the bladder. There was some. Colonic inflammation associated but not felt to represent diverticulitis. I suspect this is all a bladder inflammatory process as CT reading not consistent with epiploic appendage iritis either. He is stable and making progress on antibiotics. #2 coronary artery disease #3 chronic antiplatelet therapy with Plavix #4 neurogenic bladder #5 Apert tension #6 hypothyroidism #7 hyperlipidemia #8 remote history of C. Difficile  Plan;Complete a 10 day course of Cipro 500 twice daily and Flagyl 500 twice daily. Add floor store twice daily 3 weeks Would prefer to have patient see Dr. Karsten Ro for urology tomorrow and decide on further  workup and/or timing of repeat CT scan. If Dr. Karsten Ro  feels that this warrants further GI investigation we can see him in the office next week though again suspect that this is all related to inflammatory process of his bladder   Hani Patnode Genia Harold PA-C 05/10/2014

## 2014-05-10 NOTE — Telephone Encounter (Signed)
Samuel Flowers will review today at his office visit.

## 2014-05-10 NOTE — Patient Instructions (Signed)
Please go to the basement level to have your labs drawn.  Finish the 10 days of Cipro and Flagyl, antibiotics. Take Miralax, 17 grams in 8 oz of water daily or every other day.  Consider taking a probiotic like Florastor twice daily for 2-3 weeks.  We made you a follow up appointment with Nicoletta Ba PA-C for next week 05-18-2014 at 1:30 PM.

## 2014-05-11 NOTE — Progress Notes (Signed)
Reviewed and agree with management plan.  Cici Rodriges T. Amalea Ottey, MD FACG 

## 2014-05-13 HISTORY — PX: CATARACT EXTRACTION, BILATERAL: SHX1313

## 2014-05-18 ENCOUNTER — Telehealth: Payer: Self-pay | Admitting: *Deleted

## 2014-05-18 ENCOUNTER — Ambulatory Visit: Payer: 59 | Admitting: Physician Assistant

## 2014-05-18 NOTE — Telephone Encounter (Signed)
noted 

## 2014-05-18 NOTE — Telephone Encounter (Signed)
The patient called and I told him his lab results per Nicoletta Ba PA. He also said his Urologist said he has Flaccid Bladder Disfunction and Neurogenic bladder disfunction.  His doctor told him he will have to catheterize himself.

## 2014-05-19 ENCOUNTER — Telehealth: Payer: Self-pay | Admitting: *Deleted

## 2014-05-20 NOTE — Telephone Encounter (Signed)
See note from 05-18-2014, per Amy Esterwood PA-C.

## 2014-06-07 ENCOUNTER — Encounter: Payer: Self-pay | Admitting: Family Medicine

## 2014-06-07 ENCOUNTER — Ambulatory Visit (INDEPENDENT_AMBULATORY_CARE_PROVIDER_SITE_OTHER): Payer: BC Managed Care – PPO | Admitting: Family Medicine

## 2014-06-07 VITALS — BP 148/74 | HR 49 | Ht 68.0 in | Wt 170.0 lb

## 2014-06-07 DIAGNOSIS — M722 Plantar fascial fibromatosis: Secondary | ICD-10-CM

## 2014-06-07 NOTE — Patient Instructions (Signed)
You have plantar fasciitis Take tylenol or aleve as needed for pain  Plantar fascia stretch for 20-30 seconds (do 3 of these) in morning Lowering/raise on a step exercises 3 x 10 once or twice a day - this is very important for long term recovery. Can add heel walks, toe walks forward and backward as well Ice heel for 15 minutes as needed. Avoid flat shoes/barefoot walking as much as possible. Arch straps have been shown to help with pain. Orthotics with heel lift may be helpful (try something like dr. Zoe Lan active series insoles). Steroid injection is a consideration for short term pain relief if you are struggling - typically where you are hurting though they are not recommended because it's so close to the fat pad. Physical therapy is also an option. Follow up with me in 6 weeks.

## 2014-06-08 ENCOUNTER — Telehealth: Payer: Self-pay | Admitting: Family Medicine

## 2014-06-08 NOTE — Telephone Encounter (Signed)
Spoke with patient and gave him the information.

## 2014-06-08 NOTE — Telephone Encounter (Signed)
Unable to contact patient. Left message for patient to call office.

## 2014-06-08 NOTE — Telephone Encounter (Signed)
Yes this will not go away in days - it usually takes weeks at the least to get improvement with what we discussed.  The pain on the outside of the foot is more of a peroneal tendinitis - improves with anti-inflammatories, arch supports, and the exercises.

## 2014-06-09 NOTE — Progress Notes (Signed)
PCP: Scarlette Calico, MD  Subjective:   HPI: Patient is a 64 y.o. male here for bilateral foot pain.  Patient reports about 5 days ago he started to get bilateral heel pain. No known injury. Does walk a lot though. Pain worse first thing in morning, after prolonged immobility. Going to be traveling for a month and concerned about this. No swelling or bruising.  Past Medical History  Diagnosis Date  . CAD (coronary artery disease)   . Rhinitis, allergic   . Acute asthmatic bronchitis   . Hyperlipemia   . Hypothyroidism   . IBS (irritable bowel syndrome)   . BPH (benign prostatic hypertrophy)     mild  . Fatty liver   . Nonspecific colitis   . Allergy   . Colitis   . C. difficile colitis     Current Outpatient Prescriptions on File Prior to Visit  Medication Sig Dispense Refill  . aspirin 81 MG tablet Take 81 mg by mouth daily.    Marland Kitchen atropine 1 % ophthalmic solution   0  . Bromfenac Sodium (PROLENSA) 0.07 % SOLN Place 1 drop into the left eye daily.    . clopidogrel (PLAVIX) 75 MG tablet Take 75 mg by mouth daily.    . CRESTOR 10 MG tablet TAKE 1 TABLET DAILY 30 tablet 0  . diphenhydrAMINE (BENADRYL) 25 mg capsule Take 25 mg by mouth every 6 (six) hours as needed for itching or allergies.    . fenofibrate (TRICOR) 145 MG tablet TAKE 1 TABLET DAILY 30 tablet 0  . loratadine (CLARITIN) 10 MG tablet Take 10 mg by mouth daily.    . metroNIDAZOLE (FLAGYL) 500 MG tablet Take 1 tablet (500 mg total) by mouth 2 (two) times daily. 20 tablet 0  . prednisoLONE acetate (PRED FORTE) 1 % ophthalmic suspension Place 1 drop into the left eye 2 (two) times daily.    . Probiotic Product (PROBIOTIC DAILY) CAPS Take 2 capsules by mouth once.    . saccharomyces boulardii (FLORASTOR) 250 MG capsule Take 1 capsule (250 mg total) by mouth 2 (two) times daily. 42 capsule 0  . tamsulosin (FLOMAX) 0.4 MG CAPS TAKE 1 CAPSULE (0.4 MG TOTAL) TWICE A DAY 180 capsule 0   No current facility-administered  medications on file prior to visit.    Past Surgical History  Procedure Laterality Date  . Coronary artery bypass graft  2002  . Lumbar laminectomy  1990  . Eye surgery      No Known Allergies  History   Social History  . Marital Status: Married    Spouse Name: N/A    Number of Children: 2  . Years of Education: N/A   Occupational History  . Retired    Social History Main Topics  . Smoking status: Never Smoker   . Smokeless tobacco: Never Used  . Alcohol Use: 1.8 oz/week    3 Not specified per week     Comment: occasionally  . Drug Use: No  . Sexual Activity: Yes   Other Topics Concern  . Not on file   Social History Narrative    Family History  Problem Relation Age of Onset  . Colon polyps Father   . Coronary artery disease Father   . Heart disease Father   . Diabetes Father   . Hyperlipidemia Father   . Coronary artery disease Mother   . Mental illness Mother   . Colon cancer Paternal Aunt   . Irritable bowel syndrome Neg Hx   .  Stroke Neg Hx   . Alcohol abuse Neg Hx   . Cancer Neg Hx   . Depression Neg Hx   . Early death Neg Hx   . Hypertension Neg Hx     BP 148/74 mmHg  Pulse 49  Ht 5\' 8"  (1.727 m)  Wt 170 lb (77.111 kg)  BMI 25.85 kg/m2  Review of Systems: See HPI above.    Objective:  Physical Exam:  Gen: NAD  Bilateral feet/ankles: No gross deformity, swelling, ecchymoses FROM TTP plantar fasciae at insertion on calcaneus. Negative ant drawer and talar tilt.   Negative syndesmotic compression. Negative calcaneal squeeze. Thompsons test negative. NV intact distally.    Assessment & Plan:  1. Bilateral plantar fasciitis - reviewed home exercises to do daily.  Icing, tylenol/nsaids.  Arch binders, better arch support.  Consider steroid injection though patient's pain is more posterior in the heel than these are typically given.  Consider physical therapy, custom orthotics if not improving.  F/u in 6 weeks.

## 2014-06-13 DIAGNOSIS — M722 Plantar fascial fibromatosis: Secondary | ICD-10-CM | POA: Insufficient documentation

## 2014-06-13 NOTE — Assessment & Plan Note (Signed)
reviewed home exercises to do daily.  Icing, tylenol/nsaids.  Arch binders, better arch support.  Consider steroid injection though patient's pain is more posterior in the heel than these are typically given.  Consider physical therapy, custom orthotics if not improving.  F/u in 6 weeks.

## 2014-07-25 ENCOUNTER — Telehealth: Payer: Self-pay | Admitting: Cardiology

## 2014-07-25 DIAGNOSIS — I2510 Atherosclerotic heart disease of native coronary artery without angina pectoris: Secondary | ICD-10-CM

## 2014-07-25 DIAGNOSIS — E785 Hyperlipidemia, unspecified: Secondary | ICD-10-CM

## 2014-07-25 DIAGNOSIS — I10 Essential (primary) hypertension: Secondary | ICD-10-CM

## 2014-07-25 NOTE — Telephone Encounter (Signed)
Pt would like some lab orders put in for his:  -cholesterol  -kidney function  -C-reactive protein (call to confirm this one)  Thanks

## 2014-07-25 NOTE — Telephone Encounter (Signed)
Detailed message left on personal voicemail regarding location of lab in the [facility]. Reminded patient to fast for those labs. He is to call back with questions.

## 2014-07-27 ENCOUNTER — Telehealth: Payer: Self-pay | Admitting: Cardiology

## 2014-07-27 ENCOUNTER — Other Ambulatory Visit: Payer: Self-pay | Admitting: *Deleted

## 2014-07-27 ENCOUNTER — Other Ambulatory Visit (INDEPENDENT_AMBULATORY_CARE_PROVIDER_SITE_OTHER): Payer: BC Managed Care – PPO

## 2014-07-27 DIAGNOSIS — I2510 Atherosclerotic heart disease of native coronary artery without angina pectoris: Secondary | ICD-10-CM

## 2014-07-27 DIAGNOSIS — I10 Essential (primary) hypertension: Secondary | ICD-10-CM

## 2014-07-27 DIAGNOSIS — E785 Hyperlipidemia, unspecified: Secondary | ICD-10-CM

## 2014-07-27 LAB — LIPID PANEL
CHOL/HDL RATIO: 2
Cholesterol: 128 mg/dL (ref 0–200)
HDL: 56.4 mg/dL (ref >39.00)
LDL CALC: 56 mg/dL (ref 0–99)
NonHDL: 71.6
TRIGLYCERIDES: 77 mg/dL (ref 0.0–149.0)
VLDL: 15.4 mg/dL (ref 0.0–40.0)

## 2014-07-27 LAB — COMPREHENSIVE METABOLIC PANEL
ALK PHOS: 46 U/L (ref 39–117)
ALT: 16 U/L (ref 0–53)
AST: 22 U/L (ref 0–37)
Albumin: 4.2 g/dL (ref 3.5–5.2)
BILIRUBIN TOTAL: 0.4 mg/dL (ref 0.2–1.2)
BUN: 21 mg/dL (ref 6–23)
CO2: 32 mEq/L (ref 19–32)
CREATININE: 1.17 mg/dL (ref 0.40–1.50)
Calcium: 9.5 mg/dL (ref 8.4–10.5)
Chloride: 105 mEq/L (ref 96–112)
GFR: 66.93 mL/min (ref >60.00)
GLUCOSE: 72 mg/dL (ref 70–99)
Potassium: 4.1 mEq/L (ref 3.5–5.1)
Sodium: 141 mEq/L (ref 135–145)
Total Protein: 6.9 g/dL (ref 6.0–8.3)

## 2014-07-27 LAB — C-REACTIVE PROTEIN: CRP: 0.3 mg/dL — AB (ref 0.5–20.0)

## 2014-07-27 NOTE — Progress Notes (Signed)
HPI: FU coronary disease. Patient had coronary artery bypass and graft in 2002. His last cardiac catheterization in 2003 revealed patent grafts but there was a stenosis in a posterior lateral branch that was not grafted. His last Myoview was performed in Oct 2014 showed EF 65 and normal perfusion. Since he was last seen, the patient has dyspnea with more extreme activities but not with routine activities. It is relieved with rest. It is not associated with chest pain. There is no orthopnea, PND or pedal edema. There is no syncope or palpitations. There is no exertional chest pain.    Current Outpatient Prescriptions  Medication Sig Dispense Refill  . aspirin 81 MG tablet Take 81 mg by mouth daily.    Marland Kitchen atropine 1 % ophthalmic solution   0  . Bromfenac Sodium (PROLENSA) 0.07 % SOLN Place 1 drop into the left eye daily.    . CRESTOR 10 MG tablet TAKE 1 TABLET DAILY 30 tablet 0  . diphenhydrAMINE (BENADRYL) 25 mg capsule Take 25 mg by mouth every 6 (six) hours as needed for itching or allergies.    . fenofibrate (TRICOR) 145 MG tablet TAKE 1 TABLET DAILY 30 tablet 0  . finasteride (PROSCAR) 5 MG tablet Take 5 mg by mouth daily.  1  . loratadine (CLARITIN) 10 MG tablet Take 10 mg by mouth daily.    . metroNIDAZOLE (FLAGYL) 500 MG tablet Take 1 tablet (500 mg total) by mouth 2 (two) times daily. 20 tablet 0  . saccharomyces boulardii (FLORASTOR) 250 MG capsule Take 1 capsule (250 mg total) by mouth 2 (two) times daily. 42 capsule 0  . tamsulosin (FLOMAX) 0.4 MG CAPS TAKE 1 CAPSULE (0.4 MG TOTAL) TWICE A DAY 180 capsule 0   No current facility-administered medications for this visit.     Past Medical History  Diagnosis Date  . CAD (coronary artery disease)   . Rhinitis, allergic   . Acute asthmatic bronchitis   . Hyperlipemia   . Hypothyroidism   . IBS (irritable bowel syndrome)   . BPH (benign prostatic hypertrophy)     mild  . Fatty liver   . Nonspecific colitis   . Allergy     . Colitis   . C. difficile colitis     Past Surgical History  Procedure Laterality Date  . Coronary artery bypass graft  2002  . Lumbar laminectomy  1990  . Eye surgery      History   Social History  . Marital Status: Married    Spouse Name: N/A  . Number of Children: 2  . Years of Education: N/A   Occupational History  . Retired    Social History Main Topics  . Smoking status: Never Smoker   . Smokeless tobacco: Never Used  . Alcohol Use: 1.8 oz/week    3 Standard drinks or equivalent per week     Comment: occasionally  . Drug Use: No  . Sexual Activity: Yes   Other Topics Concern  . Not on file   Social History Narrative    ROS: no fevers or chills, productive cough, hemoptysis, dysphasia, odynophagia, melena, hematochezia, dysuria, hematuria, rash, seizure activity, orthopnea, PND, pedal edema, claudication. Remaining systems are negative.  Physical Exam: Well-developed well-nourished in no acute distress.  Skin is warm and dry.  HEENT is normal.  Neck is supple.  Chest is clear to auscultation with normal expansion.  Cardiovascular exam is regular rate and rhythm.  Abdominal exam nontender or  distended. No masses palpated. Extremities show no edema. neuro grossly intact  ECG Sinus rhythm, RVCD, nonspecific T wave changes

## 2014-07-27 NOTE — Telephone Encounter (Signed)
Orders resubmitted for phlebotomy collect &resulting for [place] [facility].

## 2014-07-29 ENCOUNTER — Encounter: Payer: Self-pay | Admitting: Cardiology

## 2014-07-29 ENCOUNTER — Ambulatory Visit (INDEPENDENT_AMBULATORY_CARE_PROVIDER_SITE_OTHER): Payer: BC Managed Care – PPO | Admitting: Cardiology

## 2014-07-29 VITALS — BP 140/80 | HR 50 | Ht 68.0 in | Wt 170.0 lb

## 2014-07-29 DIAGNOSIS — I2510 Atherosclerotic heart disease of native coronary artery without angina pectoris: Secondary | ICD-10-CM

## 2014-07-29 DIAGNOSIS — I10 Essential (primary) hypertension: Secondary | ICD-10-CM

## 2014-07-29 NOTE — Patient Instructions (Signed)
Your physician wants you to follow-up in: ONE YEAR WITH DR CRENSHAW You will receive a reminder letter in the mail two months in advance. If you don't receive a letter, please call our office to schedule the follow-up appointment.  

## 2014-07-29 NOTE — Assessment & Plan Note (Signed)
Continue aspirin and statin. 

## 2014-07-29 NOTE — Assessment & Plan Note (Signed)
Continue statin. Recent lipids and liver outstanding.

## 2014-08-15 ENCOUNTER — Encounter: Payer: Self-pay | Admitting: Gastroenterology

## 2014-09-08 ENCOUNTER — Ambulatory Visit: Payer: BC Managed Care – PPO | Admitting: Cardiology

## 2014-12-08 ENCOUNTER — Encounter: Payer: Self-pay | Admitting: Gastroenterology

## 2015-02-19 ENCOUNTER — Ambulatory Visit (INDEPENDENT_AMBULATORY_CARE_PROVIDER_SITE_OTHER): Payer: BC Managed Care – PPO | Admitting: Family Medicine

## 2015-02-19 VITALS — BP 112/76 | HR 64 | Temp 98.2°F | Resp 16 | Ht 68.0 in | Wt 176.0 lb

## 2015-02-19 DIAGNOSIS — B029 Zoster without complications: Secondary | ICD-10-CM | POA: Diagnosis not present

## 2015-02-19 DIAGNOSIS — L210 Seborrhea capitis: Secondary | ICD-10-CM

## 2015-02-19 MED ORDER — VALACYCLOVIR HCL 1 G PO TABS: 1000.0000 mg | ORAL_TABLET | Freq: Three times a day (TID) | ORAL | Status: DC

## 2015-02-19 MED ORDER — BETAMETHASONE VALERATE 0.1 % EX LOTN: 1.0000 "application " | TOPICAL_LOTION | Freq: Two times a day (BID) | CUTANEOUS | Status: DC

## 2015-02-19 NOTE — Patient Instructions (Signed)

## 2015-02-19 NOTE — Progress Notes (Signed)
@UMFCLOGO @  This chart was scribed for Robyn Haber, MD by Thea Alken, ED Scribe. This patient was seen in room 13 and the patient's care was started at 8:19 AM.  Patient ID: Samuel Flowers MRN: [id number], DOB: 10-18-1950, 64 y.o. Date of Encounter: 02/19/2015, 8:09 AM  Primary Physician: Scarlette Calico, MD  Chief Complaint:  Chief Complaint  Patient presents with   Headache    right sided   Neck Pain   Rash    HPI: 64 y.o. year old male with history below presents with a erythematous, rash that began. Pt reports symptoms started with a right sided neck ache 5 days ago, followed by a sensitive scalp. He reports a couple day later he developed erythematous welts to right neck and right arm pain as well as a shooting, sharp, right sided HA. He took 2 ibuprofen yesterday.    Pt is retired from [employer]   Past Medical History  Diagnosis Date   CAD (coronary artery disease)    Rhinitis, allergic    Acute asthmatic bronchitis    Hyperlipemia    Hypothyroidism    IBS (irritable bowel syndrome)    BPH (benign prostatic hypertrophy)     mild   Fatty liver    Nonspecific colitis    Allergy    Colitis    C. difficile colitis      Home Meds: Prior to Admission medications   Medication Sig Start Date End Date Taking? Authorizing Provider  aspirin 81 MG tablet Take 81 mg by mouth daily.    Historical Provider, MD  atropine 1 % ophthalmic solution  03/24/14   Historical Provider, MD  Bromfenac Sodium (PROLENSA) 0.07 % SOLN Place 1 drop into the left eye daily.    Historical Provider, MD  CRESTOR 10 MG tablet TAKE 1 TABLET DAILY 05/03/14   Lelon Perla, MD  diphenhydrAMINE (BENADRYL) 25 mg capsule Take 25 mg by mouth every 6 (six) hours as needed for itching or allergies.    Historical Provider, MD  fenofibrate (TRICOR) 145 MG tablet TAKE 1 TABLET DAILY 05/03/14   Lelon Perla, MD  finasteride (PROSCAR) 5 MG tablet Take 5 mg by mouth daily. 05/12/14    Historical Provider, MD  loratadine (CLARITIN) 10 MG tablet Take 10 mg by mouth daily.    Historical Provider, MD  metroNIDAZOLE (FLAGYL) 500 MG tablet Take 1 tablet (500 mg total) by mouth 2 (two) times daily. 05/05/14   Lacretia Leigh, MD  saccharomyces boulardii (FLORASTOR) 250 MG capsule Take 1 capsule (250 mg total) by mouth 2 (two) times daily. 05/10/14   Amy S Esterwood, PA-C  tamsulosin (FLOMAX) 0.4 MG CAPS TAKE 1 CAPSULE (0.4 MG TOTAL) TWICE A DAY 09/20/12   Janith Lima, MD    Allergies: No Known Allergies  Social History   Social History   Marital Status: Married    Spouse Name: N/A   Number of Children: 2   Years of Education: N/A   Occupational History   Retired    Social History Main Topics   Smoking status: Never Smoker    Smokeless tobacco: Never Used   Alcohol Use: 1.8 oz/week    3 Standard drinks or equivalent per week     Comment: occasionally   Drug Use: No   Sexual Activity: Yes   Other Topics Concern   Not on file   Social History Narrative     Review of Systems: Constitutional: negative for chills, fever, night sweats, weight changes,  or fatigue  HEENT: negative for vision changes, hearing loss, congestion, rhinorrhea, ST, epistaxis, or sinus pressure Cardiovascular: negative for chest pain or palpitations Respiratory: negative for hemoptysis, wheezing, shortness of breath, or cough Abdominal: negative for abdominal pain, nausea, vomiting, diarrhea, or constipation Dermatological: Positive rash. Neurologic: negative for dizziness, or syncope. Positive for headache. All other systems reviewed and are otherwise negative with the exception to those above and in the HPI.   Physical Exam: Blood pressure 112/76, pulse 64, temperature 98.2 F (36.8 C), resp. rate 16, height 5\' 8"  (1.727 m), weight 176 lb (79.833 kg)., Body mass index is 26.77 kg/(m^2). General: Well developed, well nourished, in no acute distress. Head: Normocephalic,  atraumatic, eyes without discharge, sclera non-icteric, nares are without discharge. Bilateral auditory canals clear, TM's are without perforation, pearly grey and translucent with reflective cone of light bilaterally. Oral cavity moist, posterior pharynx without exudate, erythema, peritonsillar abscess, or post nasal drip.  Neck: Supple. No thyromegaly. Full ROM. No lymphadenopathy. Lungs: Clear bilaterally to auscultation without wheezes, rales, or rhonchi. Breathing is unlabored. Heart: RRR with S1 S2. No murmurs, rubs, or gallops appreciated. Abdomen: Soft, non-tender, non-distended with normoactive bowel sounds. No hepatomegaly. No rebound/guarding. No obvious abdominal masses. Msk:  Strength and tone normal for age. Extremities/Skin: Warm and dry. No clubbing or cyanosis. No edema. No rashes or suspicious lesions.  Neuro: Alert and oriented X 3. Moves all extremities spontaneously. Gait is normal. CNII-XII grossly in tact. Psych:  Responds to questions appropriately with a normal affect.    ASSESSMENT AND PLAN:  64 y.o. year old male with shingles This chart was scribed in my presence and reviewed by me personally.    ICD-9-CM ICD-10-CM   1. Shingles 053.9 B02.9 valACYclovir (VALTREX) 1000 MG tablet     By signing my name below, I, Raven Small, attest that this documentation has been prepared under the direction and in the presence of Robyn Haber, MD.  Electronically Signed: Thea Alken, ED Scribe. 02/19/2015. 8:30 AM.   Signed, Robyn Haber, MD 02/19/2015 8:09 AM

## 2015-02-19 NOTE — Addendum Note (Signed)
Addended by: Robyn Haber on: 02/19/2015 08:53 AM   Modules accepted: Orders

## 2015-04-24 ENCOUNTER — Telehealth: Payer: Self-pay | Admitting: *Deleted

## 2015-04-24 MED ORDER — FENOFIBRATE 145 MG PO TABS: 145.0000 mg | ORAL_TABLET | Freq: Every day | ORAL | Status: DC

## 2015-04-24 MED ORDER — ROSUVASTATIN CALCIUM 10 MG PO TABS: 10.0000 mg | ORAL_TABLET | Freq: Every day | ORAL | Status: DC

## 2015-04-24 NOTE — Telephone Encounter (Signed)
Rx send to pt pharmacy #[id number] no refills

## 2015-04-24 NOTE — Telephone Encounter (Signed)
Patient left a voicemail requesting refills on crestor and tricor be sent to [facility]. Thanks, MI

## 2015-05-14 DIAGNOSIS — R39198 Other difficulties with micturition: Secondary | ICD-10-CM

## 2015-05-14 HISTORY — DX: Other difficulties with micturition: R39.198

## 2015-07-01 IMAGING — CT CT ABD-PELV W/ CM
1 of 3 series · 12 of 32 positions shown, 16 images · IV contrast (OMNIPAQUE 300)
Comparison: Plain film from earlier in the same day

ADDENDUM:
I have now compared with the CT scan of 05/05/2014 to CT scans of
the abdomen dated 01/19/2010, 10/07/2006, 12/10/2002 and a report of
CT scan dated 08/13/2001.
CLINICAL DATA: Lower abdominal pain

EXAM:
CT ABDOMEN AND PELVIS WITH CONTRAST
TECHNIQUE: Multidetector CT imaging of the abdomen and pelvis was performed
using the standard protocol following bolus administration of
intravenous contrast.
CONTRAST:  100mL OMNIPAQUE IOHEXOL 300 MG/ML  SOLN

[Series 2: abd/pel with · axial · 0.74mm/px · z∈[-442,-62]mm · 12 of 86 slices shown, 16 images]
[im 5/86  soft-tissue]
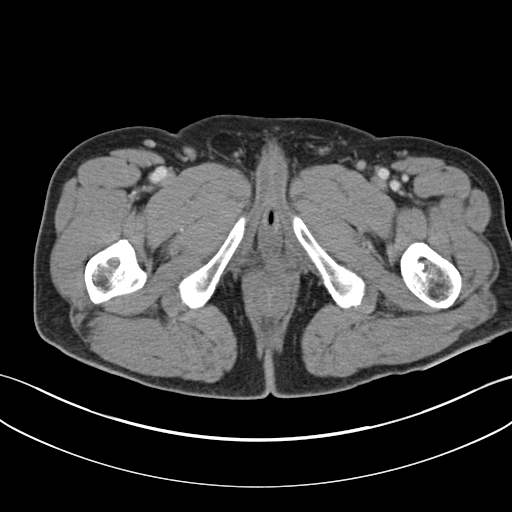
[im 5/86  bone]
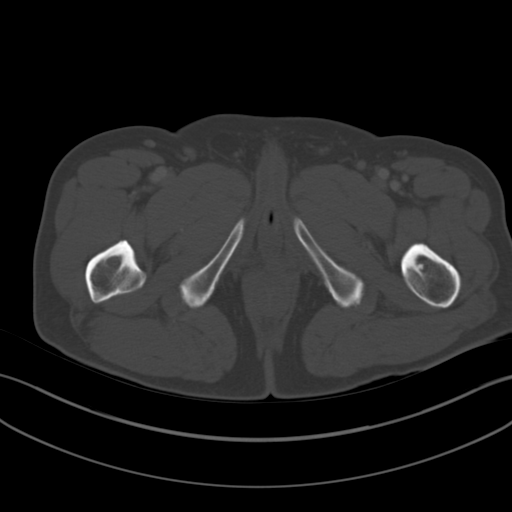
[im 13/86  soft-tissue]
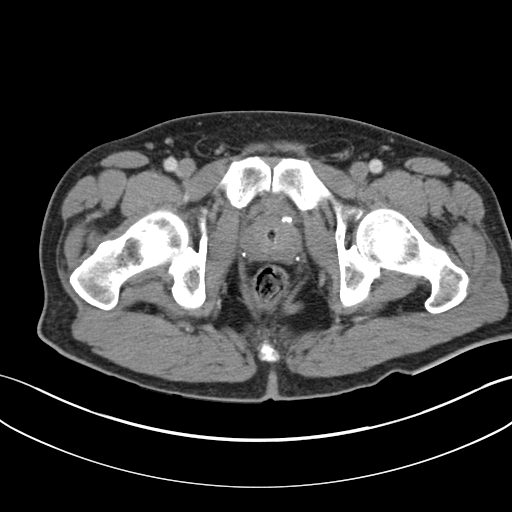
[im 22/86  soft-tissue]
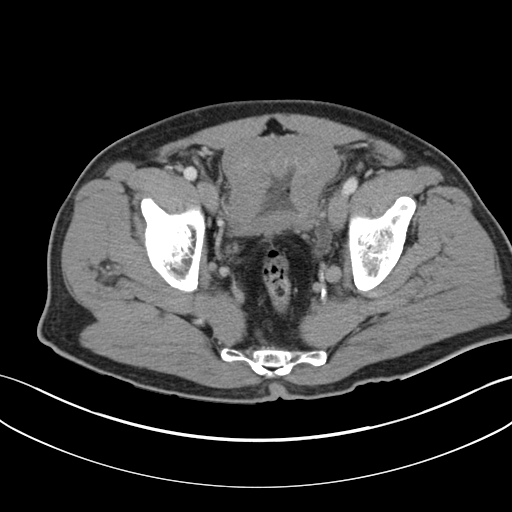
[im 30/86  soft-tissue]
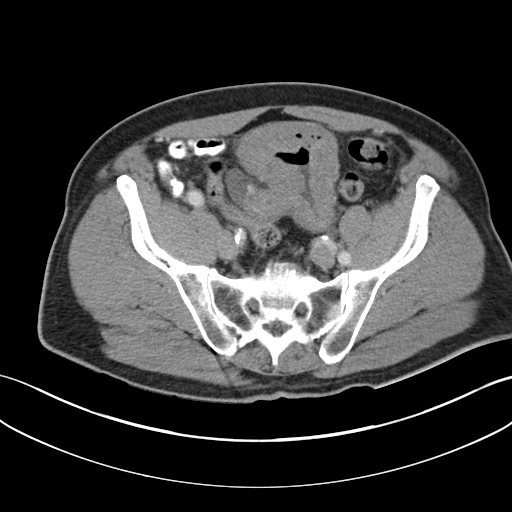
[im 39/86  soft-tissue]
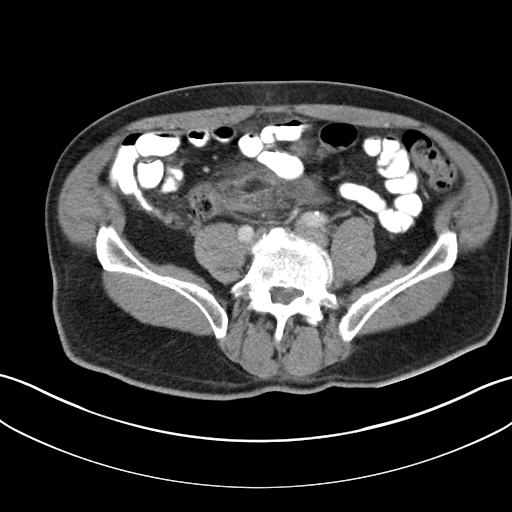
[im 47/86  soft-tissue]
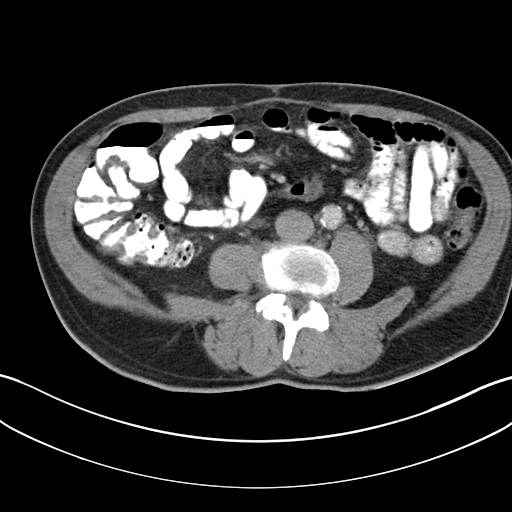
[im 56/86  soft-tissue]
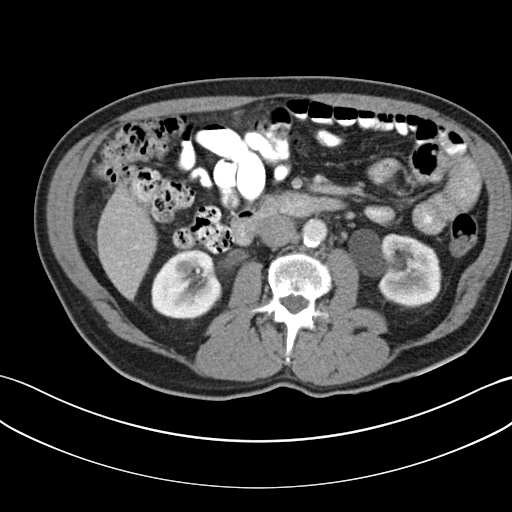
[im 64/86  soft-tissue]
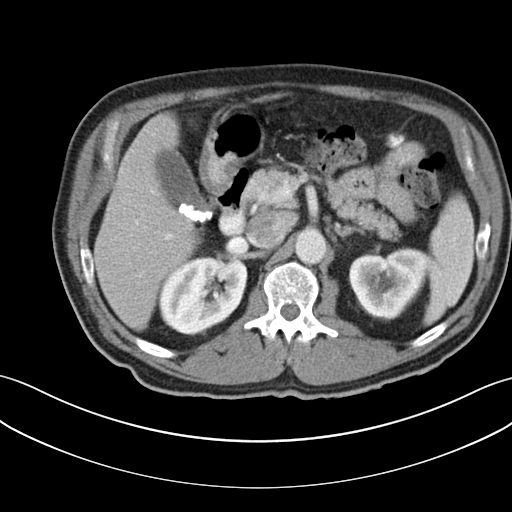
[im 69/86  lung]
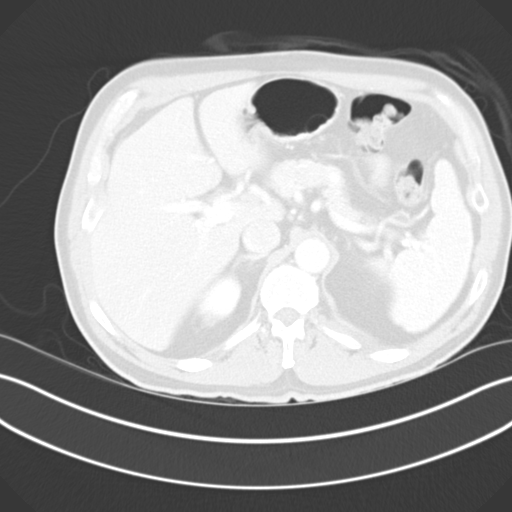
[im 73/86  soft-tissue]
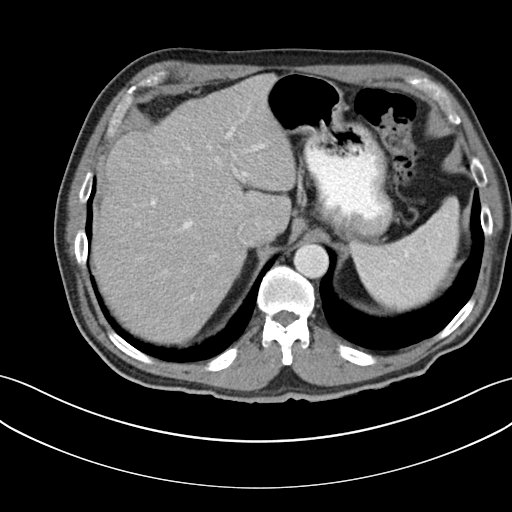
[im 73/86  lung]
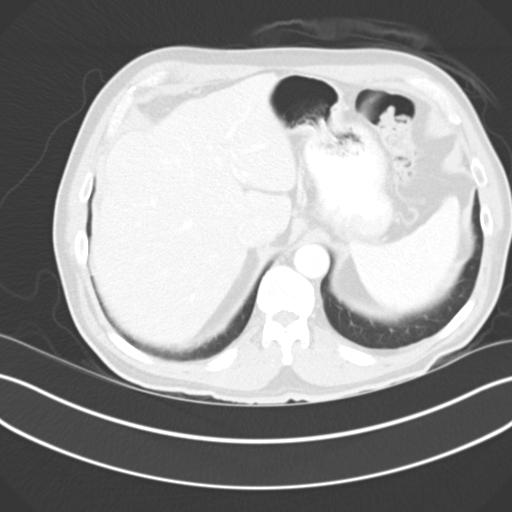
[im 73/86  bone]
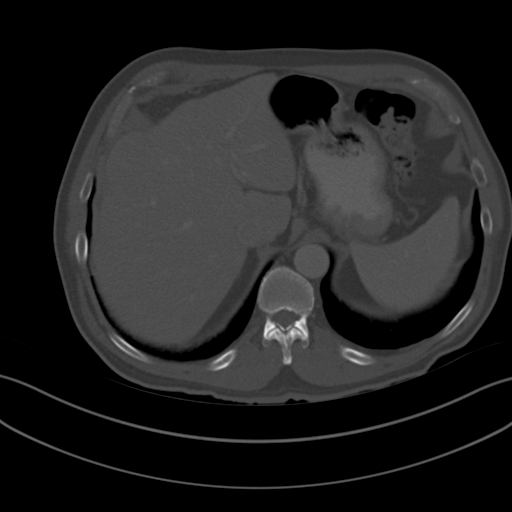
[im 77/86  lung]
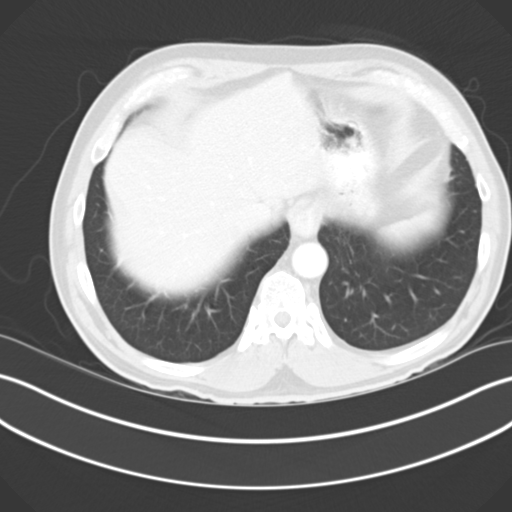
[im 81/86  soft-tissue]
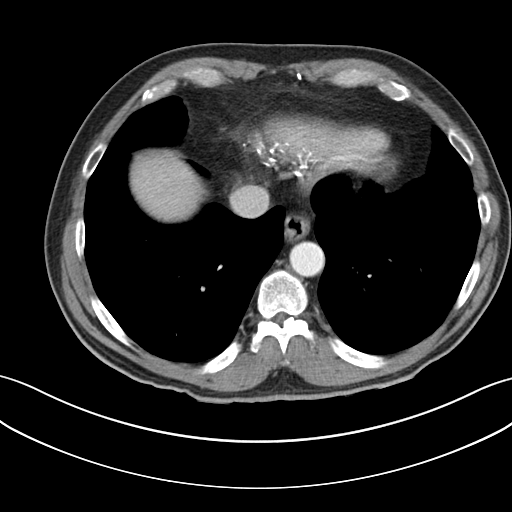
[im 81/86  lung]
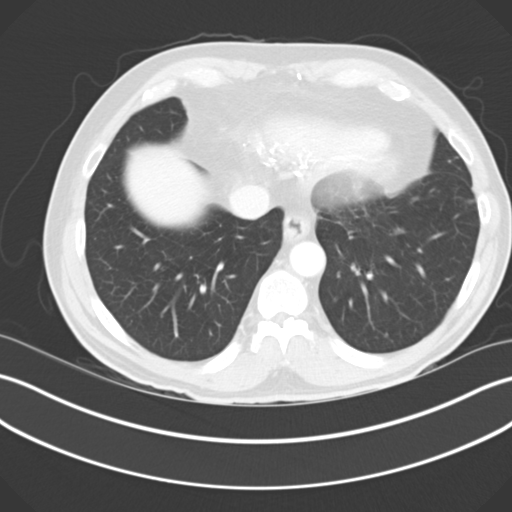

[12 of 32 positions shown; findings below may reference images not displayed]

FINDINGS: The patient has had chronic distention of the urinary bladder since
the prior exam of 12/10/2002 and distended urinary bladder was
described on the report of 08/13/2001. The finding is consistent
with a neurogenic bladder, probably sensory or motor paralytic.

The patient had surgery at L4-5 and possibly at L5-S1 over 20 years
ago. Could the neurogenic bladder be the result of lower motor
neuron or sensory neuron damage at that time period?

The pericolonic inflammation on the current exam is similar to that
seen on the CT scan of 01/19/2010 when the patient was determined
have epiploic appendagitis rather than diverticulitis. I do not see
any diverticula in the area of soft tissue stranding on the current
exam and patient has no fever or elevated white blood count. This
could represent recurrent epiploic appendagitis.

However, the patient also has fluid around the right side of the
dome of the bladder which appears to be more associated with the
bladder than the colon.

Given that the patient had massive distention of the bladder and the
bladder was decompressed and drained of 3.5 L of urine and
diverticula are present in the dome of the bladder and to the right
of midline in the dome of the bladder on the prior CT scan, the
possibility of a focal tiny contained perforation of the bladder
should be considered.

The slight dilatation of the proximal renal collecting systems is
probably due to compression of the ureters at the pelvic inlet when
the bladder was markedly distended. However, the left ureter is
dilated to the level of the ureterovesical junction.

The prostate gland is not enlarged.  Seminal vesicles are normal.
IMPRESSION: 1. Neurogenic bladder, possibly related to neural damage by lumbar
disc disease 20 years ago. The patient appears to have had surgery
at L4-5 and L5-S1.

2. Fluid around the dome of the bladder and to the right is atypical
for diverticulitis and epiploic appendagitis. The possibility of a
micro perforation of the bladder at that site should be considered.

3. Soft tissue stranding around the sigmoid colon adjacent to the
dome of the bladder could represent focal epiploic appendagitis but
I doubt that it represents diverticulitis. However, I suspect all of
the findings are related to a micro perforation of the bladder.
FINDINGS: Lung bases are free of acute infiltrate or sizable effusion.

The liver, spleen, adrenal glands and pancreas are within normal
limits. The gallbladder is well distended with multiple thin
gallstones. No wall thickening or pericholecystic fluid is seen.

The kidneys are well visualized bilaterally with cystic change
identified. Bilateral extrarenal pelves are noted more prominent on
the left than the right. Fullness of the proximal ureter on the left
is seen. The bladder is been decompressed by Foley catheter
eliminating the soft tissue mass seen on prior plain film
examination. The bladder wall is significantly thickened. This is
likely related to the decompression following long standing
distention. Air is noted within the bladder wall although this is
felt to be related to trapped air related to the Foley catheter
placement.

In the sigmoid colon lying just above the bladder there is
inflammatory change with mild extraluminal fluid collection along
the bladder wall consistent with diverticulitis and localized
perforation. A drainable abscess is not seen at this time. No
significant free air is noted. Diffuse aortoiliac calcifications are
seen without aneurysmal dilatation. The appendix is barium filled
and within normal limits. No pelvic mass lesion is noted. The
osseous structures are within normal limits for the patient's age.
IMPRESSION: Changes consistent with diverticulitis with mild extra luminal fluid
although no demonstrable abscess is seen at this time. Fluid lies
along the superior aspect of the urinary bladder but has not
organized and is not amenable percutaneous drainage. Followup
imaging following appropriate therapy is recommended.

Cholelithiasis without complicating factors.

Mild fullness of the left collecting system likely related to the
recent significant bladder distention.

Significant bladder wall thickening likely related to decompression
of the over distended bladder by Foley catheter. Some air is noted
within the bladder and suggestion of air within the bladder wall
although this is likely related to the Foley catheter placement.

## 2015-07-03 ENCOUNTER — Other Ambulatory Visit: Payer: Self-pay | Admitting: Cardiology

## 2015-07-03 NOTE — Telephone Encounter (Signed)
Rx request sent to pharmacy.  

## 2015-08-14 ENCOUNTER — Other Ambulatory Visit: Payer: Self-pay

## 2015-08-14 MED ORDER — ROSUVASTATIN CALCIUM 10 MG PO TABS: 10.0000 mg | ORAL_TABLET | Freq: Every day | ORAL | Status: DC

## 2015-08-14 MED ORDER — FENOFIBRATE 145 MG PO TABS: 145.0000 mg | ORAL_TABLET | Freq: Every day | ORAL | Status: DC

## 2015-08-14 NOTE — Telephone Encounter (Signed)
MEDICATION REFILL FOR ROSUVASTATIN CALCIUM HAS BEEN SENT TO PHARMACY FOR REFILL

## 2015-08-14 NOTE — Telephone Encounter (Signed)
REFILL FOR FENOFIBRATE HAS BEEN SENT TO THE PHARMACY FOR REFILLS

## 2015-08-16 ENCOUNTER — Other Ambulatory Visit: Payer: Self-pay | Admitting: Cardiology

## 2015-08-16 MED ORDER — ROSUVASTATIN CALCIUM 10 MG PO TABS: 10.0000 mg | ORAL_TABLET | Freq: Every day | ORAL | Status: DC

## 2015-08-16 MED ORDER — FENOFIBRATE 145 MG PO TABS: 145.0000 mg | ORAL_TABLET | Freq: Every day | ORAL | Status: DC

## 2015-08-16 NOTE — Telephone Encounter (Signed)
Rx(s) sent to pharmacy electronically.  

## 2015-11-03 ENCOUNTER — Other Ambulatory Visit: Payer: Self-pay | Admitting: Cardiology

## 2015-11-08 ENCOUNTER — Telehealth: Payer: Self-pay | Admitting: Cardiology

## 2015-11-08 NOTE — Telephone Encounter (Signed)
[facility] pharmacy calling requesting a refill on pt's medication of Fenofibrate 145 mg tab. Pharmacy would like a call back at [phone], ref # [id number], contact name is Maxie Better

## 2015-11-08 NOTE — Telephone Encounter (Signed)
Please close encounter.. Thanks! °

## 2015-11-08 NOTE — Telephone Encounter (Signed)
Refill authorized for 90 days to [facility]

## 2015-11-13 NOTE — Telephone Encounter (Signed)
Do not see a refill encounter. Please advise

## 2015-11-21 ENCOUNTER — Telehealth: Payer: Self-pay | Admitting: Cardiology

## 2015-11-21 MED ORDER — ROSUVASTATIN CALCIUM 10 MG PO TABS: 10.0000 mg | ORAL_TABLET | Freq: Every day | ORAL | Status: DC

## 2015-11-21 NOTE — Telephone Encounter (Signed)
°*  STAT* If patient is at the pharmacy, call can be transferred to refill team.   1. Which medications need to be refilled? (please list name of each medication and dose if known) Crestor 10mg    2. Which pharmacy/location (including street and city if local pharmacy) is medication to be sent to? [facility]   3. Do they need a 30 day or 90 day supply? [place]

## 2015-11-21 NOTE — Telephone Encounter (Signed)
Refill sent to the pharmacy electronically.  

## 2016-01-31 ENCOUNTER — Ambulatory Visit: Payer: 59 | Admitting: Cardiology

## 2016-02-01 ENCOUNTER — Other Ambulatory Visit: Payer: Self-pay | Admitting: Cardiology

## 2016-03-21 ENCOUNTER — Encounter: Payer: Self-pay | Admitting: Cardiology

## 2016-03-21 ENCOUNTER — Ambulatory Visit (INDEPENDENT_AMBULATORY_CARE_PROVIDER_SITE_OTHER): Payer: BC Managed Care – PPO | Admitting: Cardiology

## 2016-03-21 VITALS — BP 121/80 | HR 54 | Ht 68.0 in | Wt 171.0 lb

## 2016-03-21 DIAGNOSIS — R0789 Other chest pain: Secondary | ICD-10-CM

## 2016-03-21 DIAGNOSIS — E785 Hyperlipidemia, unspecified: Secondary | ICD-10-CM

## 2016-03-21 DIAGNOSIS — R079 Chest pain, unspecified: Secondary | ICD-10-CM

## 2016-03-21 MED ORDER — ROSUVASTATIN CALCIUM 10 MG PO TABS: 10.0000 mg | ORAL_TABLET | Freq: Every day | ORAL | 3 refills | Status: DC

## 2016-03-21 MED ORDER — FENOFIBRATE 145 MG PO TABS: ORAL_TABLET | ORAL | 3 refills | Status: DC

## 2016-03-21 NOTE — Progress Notes (Addendum)
HPI: FU coronary disease. Patient had coronary artery bypass and graft in 2002. His last cardiac catheterization in 2003 revealed patent grafts but there was a stenosis in a posterior lateral branch that was not grafted. His last Myoview was performed in Oct 2014 showed EF 65 and normal perfusion. Since he was last seen, over the past 6 months he has had occasional pain in the left chest area. It is described as a tightness similar to the pain he had prior to his bypass surgery. He only notices it at the beginning of walks. It lasts 2 minutes and then resolves with continued walking. No dyspnea. No claudication.   Current Outpatient Prescriptions  Medication Sig Dispense Refill  . aspirin 81 MG tablet Take 81 mg by mouth daily.    . fenofibrate (TRICOR) 145 MG tablet TAKE 1 TABLET DAILY. MAKE  AN APPOINTMENT FOR FURTHER REFILLS 90 tablet 0  . finasteride (PROSCAR) 5 MG tablet Take 5 mg by mouth daily.  1  . loratadine (CLARITIN) 10 MG tablet Take 10 mg by mouth daily.    . rosuvastatin (CRESTOR) 10 MG tablet TAKE 1 TABLET DAILY 90 tablet 0   No current facility-administered medications for this visit.      Past Medical History:  Diagnosis Date  . Acute asthmatic bronchitis   . Allergy   . BPH (benign prostatic hypertrophy)    mild  . C. difficile colitis   . CAD (coronary artery disease)   . Colitis   . Fatty liver   . Hyperlipemia   . Hypothyroidism   . IBS (irritable bowel syndrome)   . Nonspecific colitis   . Rhinitis, allergic     Past Surgical History:  Procedure Laterality Date  . CORONARY ARTERY BYPASS GRAFT  2002  . EYE SURGERY    . LUMBAR LAMINECTOMY  1990    Social History   Social History  . Marital status: Married    Spouse name: N/A  . Number of children: 2  . Years of education: N/A   Occupational History  . Retired    Social History Main Topics  . Smoking status: Never Smoker  . Smokeless tobacco: Never Used  . Alcohol use 1.8 oz/week    3  Standard drinks or equivalent per week     Comment: occasionally  . Drug use: No  . Sexual activity: Yes   Other Topics Concern  . Not on file   Social History Narrative  . No narrative on file    Family History  Problem Relation Age of Onset  . Colon polyps Father   . Coronary artery disease Father   . Heart disease Father   . Diabetes Father   . Hyperlipidemia Father   . Coronary artery disease Mother   . Mental illness Mother   . Colon cancer Paternal Aunt   . Irritable bowel syndrome Neg Hx   . Stroke Neg Hx   . Alcohol abuse Neg Hx   . Cancer Neg Hx   . Depression Neg Hx   . Early death Neg Hx   . Hypertension Neg Hx     ROS: no fevers or chills, productive cough, hemoptysis, dysphasia, odynophagia, melena, hematochezia, dysuria, hematuria, rash, seizure activity, orthopnea, PND, pedal edema, claudication. Remaining systems are negative.  Physical Exam: Well-developed well-nourished in no acute distress.  Skin is warm and dry.  HEENT is normal.  Neck is supple.  Chest is clear to auscultation with normal expansion.  Cardiovascular exam is regular rate and rhythm.  Abdominal exam nontender or distended. No masses palpated. Extremities show no edema. neuro grossly intact  ECG -Sinus bradycardia with no ST changes.  A/P 1 coronary artery disease-continue aspirin and statin.  2 chest tightness-patient has mild chest tightness with initiation of walks. I will arrange an exercise Myoview for risk stratification.  3 hyperlipidemia-continue present medications. Check lipids and liver.  [name], MD

## 2016-03-21 NOTE — Patient Instructions (Signed)
Medication Instructions:  Your physician recommends that you continue on your current medications as directed. Please refer to the Current Medication list given to you today.  Labwork: Your physician recommends that you return for lab work in: on date of Myoview FASTING-LIPIDS, LFT  Testing/Procedures: Your physician has requested that you have en exercise stress myoview. For further information please visit [email or web address]. Please follow instruction sheet, as given.  Follow-Up: Your physician wants you to follow-up in: [place]. You will receive a reminder letter in the mail two months in advance. If you don't receive a letter, please call our office to schedule the follow-up appointment.  Any Other Special Instructions Will Be Listed Below (If Applicable).     If you need a refill on your cardiac medications before your next appointment, please call your pharmacy.

## 2016-03-25 ENCOUNTER — Ambulatory Visit: Payer: BC Managed Care – PPO | Admitting: Cardiology

## 2016-03-26 ENCOUNTER — Telehealth (HOSPITAL_COMMUNITY): Payer: Self-pay

## 2016-03-26 ENCOUNTER — Encounter (HOSPITAL_COMMUNITY): Payer: 59

## 2016-03-26 NOTE — Telephone Encounter (Signed)
Encounter complete. 

## 2016-03-28 ENCOUNTER — Ambulatory Visit (HOSPITAL_COMMUNITY)
Admission: RE | Admit: 2016-03-28 | Discharge: 2016-03-28 | Disposition: A | Discharge status: Home / Self Care | Payer: BC Managed Care – PPO | Source: Ambulatory Visit | Attending: Cardiology | Admitting: Cardiology

## 2016-03-28 DIAGNOSIS — R079 Chest pain, unspecified: Secondary | ICD-10-CM | POA: Insufficient documentation

## 2016-03-28 DIAGNOSIS — Z951 Presence of aortocoronary bypass graft: Secondary | ICD-10-CM | POA: Insufficient documentation

## 2016-03-28 DIAGNOSIS — I2510 Atherosclerotic heart disease of native coronary artery without angina pectoris: Secondary | ICD-10-CM | POA: Insufficient documentation

## 2016-03-28 DIAGNOSIS — Z8249 Family history of ischemic heart disease and other diseases of the circulatory system: Secondary | ICD-10-CM | POA: Diagnosis not present

## 2016-03-28 DIAGNOSIS — R0789 Other chest pain: Secondary | ICD-10-CM | POA: Diagnosis not present

## 2016-03-28 LAB — MYOCARDIAL PERFUSION IMAGING
CHL CUP NUCLEAR SSS: 7
CHL CUP RESTING HR STRESS: 48 {beats}/min
CSEPPHR: 68 {beats}/min
LV sys vol: 34 mL
LVDIAVOL: 90 mL (ref 62–150)
NUC STRESS TID: 0.99
SDS: 7
SRS: 0

## 2016-03-28 MED ORDER — TECHNETIUM TC 99M TETROFOSMIN IV KIT
11.0000 | PACK | Freq: Once | INTRAVENOUS | Status: AC | PRN
  Administered 2016-03-28: 11 via INTRAVENOUS
  Filled 2016-03-28: qty 11

## 2016-03-28 MED ORDER — TECHNETIUM TC 99M TETROFOSMIN IV KIT
31.3000 | PACK | Freq: Once | INTRAVENOUS | Status: AC | PRN
  Administered 2016-03-28: 31.3 via INTRAVENOUS
  Filled 2016-03-28: qty 32

## 2016-03-28 MED ORDER — REGADENOSON 0.4 MG/5ML IV SOLN
0.4000 mg | Freq: Once | INTRAVENOUS | Status: AC
  Administered 2016-03-28: 0.4 mg via INTRAVENOUS

## 2016-03-29 ENCOUNTER — Ambulatory Visit: Payer: Self-pay | Admitting: Cardiology

## 2016-04-01 ENCOUNTER — Other Ambulatory Visit: Payer: BC Managed Care – PPO

## 2016-04-02 ENCOUNTER — Encounter: Payer: Self-pay | Admitting: *Deleted

## 2016-04-02 LAB — LIPID PANEL
CHOL/HDL RATIO: 2.5 ratio (ref <5.0)
Cholesterol: 140 mg/dL (ref <200)
HDL: 55 mg/dL (ref >40)
LDL CALC: 57 mg/dL (ref <100)
Triglycerides: 142 mg/dL (ref <150)
VLDL: 28 mg/dL (ref <30)

## 2016-04-02 LAB — HEPATIC FUNCTION PANEL
ALBUMIN: 4.2 g/dL (ref 3.6–5.1)
ALK PHOS: 46 U/L (ref 40–115)
ALT: 18 U/L (ref 9–46)
AST: 24 U/L (ref 10–35)
BILIRUBIN TOTAL: 0.4 mg/dL (ref 0.2–1.2)
Bilirubin, Direct: 0.1 mg/dL (ref <=0.2)
Indirect Bilirubin: 0.3 mg/dL (ref 0.2–1.2)
Total Protein: 6.9 g/dL (ref 6.1–8.1)

## 2016-05-09 ENCOUNTER — Ambulatory Visit (INDEPENDENT_AMBULATORY_CARE_PROVIDER_SITE_OTHER): Payer: BC Managed Care – PPO | Admitting: Physician Assistant

## 2016-05-09 VITALS — BP 122/80 | HR 57 | Temp 98.5°F | Resp 16 | Ht 68.0 in | Wt 173.0 lb

## 2016-05-09 DIAGNOSIS — J069 Acute upper respiratory infection, unspecified: Secondary | ICD-10-CM

## 2016-05-09 MED ORDER — AMOXICILLIN 875 MG PO TABS: 875.0000 mg | ORAL_TABLET | Freq: Two times a day (BID) | ORAL | 0 refills | Status: DC

## 2016-05-09 NOTE — Progress Notes (Signed)
   05/09/2016 2:02 PM   DOB: Dec 16, 1950 / MRN: [id number]  SUBJECTIVE:  Samuel Flowers is a 65 y.o. male presenting for nasal congestion that started about 7 days ago. Associates rhinorrhea that has been worsening and also complains of cough. Having difficulty breathing due nasal congestion. Denies fever, chills, changes in appetite, and has been drinking fluid "okay" for him. He has tired claritin and benadryl that was somewhat helpful.    He has No Known Allergies.   He  has a past medical history of Acute asthmatic bronchitis; Allergy; BPH (benign prostatic hypertrophy); C. difficile colitis; CAD (coronary artery disease); Colitis; Fatty liver; Hyperlipemia; Hypothyroidism; IBS (irritable bowel syndrome); Nonspecific colitis; and Rhinitis, allergic.    He  reports that he has never smoked. He has never used smokeless tobacco. He reports that he drinks about 1.8 oz of alcohol per week . He reports that he does not use drugs. He  reports that he currently engages in sexual activity. The patient  has a past surgical history that includes Coronary artery bypass graft (2002); Lumbar laminectomy (1990); and Eye surgery.  His family history includes Colon cancer in his paternal aunt; Colon polyps in his father; Coronary artery disease in his father and mother; Diabetes in his father; Heart disease in his father; Hyperlipidemia in his father; Mental illness in his mother.  Review of Systems  HENT: Negative for ear pain, hearing loss, sore throat and tinnitus.   Musculoskeletal: Positive for myalgias.  Neurological: Negative for headaches.    The problem list and medications were reviewed and updated by myself where necessary and exist elsewhere in the encounter.   OBJECTIVE:  BP 122/80 (BP Location: Right Arm, Patient Position: Sitting, Cuff Size: Normal)   Pulse (!) 57   Temp 98.5 F (36.9 C) (Oral)   Resp 16   Ht 5\' 8"  (1.727 m)   Wt 173 lb (78.5 kg)   SpO2 98%   BMI 26.30 kg/m    Physical Exam  Constitutional: He is oriented to person, place, and time. He appears well-developed and well-nourished. No distress.  Cardiovascular: Normal rate and regular rhythm.   Pulmonary/Chest: Effort normal and breath sounds normal.  Musculoskeletal: Normal range of motion.  Neurological: He is alert and oriented to person, place, and time.  Skin: Skin is warm and dry. He is not diaphoretic.    No results found for this or any previous visit (from the past 72 hour(s)).  No results found.  ASSESSMENT AND PLAN  Samuel Flowers was seen today for sinus problem and chest congestion.  Diagnoses and all orders for this visit:  Acute URI Comments: His lungs are clear.  Advised he wait 10 days before filling abx. Symptomatic care per AVS until that time.  Orders: -     amoxicillin (AMOXIL) 875 MG tablet; Take 1 tablet (875 mg total) by mouth 2 (two) times daily. Please allow ten days of total illness before filling to allow a virus to run its course.  OK to fill sooner if fever greater than 100.4.    The patient is advised to call or return to clinic if he does not see an improvement in symptoms, or to seek the care of the closest emergency department if he worsens with the above plan.   Philis Fendt, MHS, PA-C [facility] 05/09/2016 2:02 PM

## 2016-05-09 NOTE — Patient Instructions (Addendum)
Continue claritin and benadryl.  Take tylenol 1000 mg every 8 hours for muscle aches and pains.  Consider Afrin nasal spray at night to help you breath (do not use for more than three consequtive night.     IF you received an x-ray today, you will receive an invoice from [facility]. Please contact [facility] at [phone] with questions or concerns regarding your invoice.   IF you received labwork today, you will receive an invoice from [place]. Please contact [facility] at [phone] with questions or concerns regarding your invoice.   Our billing staff will not be able to assist you with questions regarding bills from these companies.  You will be contacted with the lab results as soon as they are available. The fastest way to get your results is to activate your My Chart account. Instructions are located on the last page of this paperwork. If you have not heard from [place] regarding the results in 2 weeks, please contact this office.

## 2016-08-03 ENCOUNTER — Other Ambulatory Visit: Payer: Self-pay | Admitting: Cardiology

## 2016-08-05 NOTE — Telephone Encounter (Signed)
crestor & tricor refilled #[id number] with 3 refills on 03/31/16

## 2016-09-27 ENCOUNTER — Telehealth: Payer: Self-pay | Admitting: Cardiology

## 2016-09-27 NOTE — Telephone Encounter (Signed)
°*  STAT* If patient is at the pharmacy, call can be transferred to refill team.   1. Which medications need to be refilled? (please list name of each medication and dose if known) Tricor 145 mg and crestor 10 mg  2. Which pharmacy/location (including street and city if local pharmacy) is medication to be sent to?[facility] [place] [place] [facility]  3. Do they need a 30 day or 90 day supply? [place]

## 2016-10-01 ENCOUNTER — Other Ambulatory Visit: Payer: Self-pay | Admitting: *Deleted

## 2016-10-01 MED ORDER — ROSUVASTATIN CALCIUM 10 MG PO TABS: 10.0000 mg | ORAL_TABLET | Freq: Every day | ORAL | 3 refills | Status: DC

## 2016-10-01 MED ORDER — FENOFIBRATE 145 MG PO TABS: ORAL_TABLET | ORAL | 3 refills | Status: DC

## 2017-05-27 ENCOUNTER — Telehealth: Payer: Self-pay | Admitting: Cardiology

## 2017-05-27 ENCOUNTER — Other Ambulatory Visit: Payer: Self-pay | Admitting: Cardiology

## 2017-05-27 NOTE — Telephone Encounter (Signed)
New Message    *STAT* If patient is at the pharmacy, call can be transferred to refill team.   1. Which medications need to be refilled? (please list name of each medication and dose if known) fenofibrate (TRICOR) 145 MG tablet,  rosuvastatin (CRESTOR) 10 MG tablet  And Plavix  2. Which pharmacy/location (including street and city if local pharmacy) is medication to be sent to? [facility]  3. Do they need a 30 day or 90 day supply? [place]

## 2017-05-28 ENCOUNTER — Other Ambulatory Visit: Payer: Self-pay | Admitting: *Deleted

## 2017-05-30 ENCOUNTER — Other Ambulatory Visit: Payer: Self-pay | Admitting: *Deleted

## 2017-06-23 ENCOUNTER — Telehealth: Payer: Self-pay | Admitting: Internal Medicine

## 2017-06-23 NOTE — Telephone Encounter (Signed)
Copied from [place]. Topic: Appointment Scheduling - Scheduling Inquiry for Clinic >> Jun 23, 2017 12:18 PM Valla Leaver wrote: Reason for CRM: Patient calling to find out if he can still be a patient of Dr. Scarlette Calico. He say's he's been a patient of [facility] for 40 years and that he know he's supposed to have a CPE once a year although he saw Dr. Ronnald Ramp only once on 10/30/2011. I offered other [place]'s as well. Please advise.

## 2017-06-23 NOTE — Telephone Encounter (Signed)
Yes, I will see him.

## 2017-06-24 NOTE — Telephone Encounter (Signed)
LVM to inform patient Dr. Ronnald Ramp will see him. To please call back and set up an appointment.   Please in the type of visit put OV and in the comments NEW PATIENT - We will have a manager over ride it later. Thank you.

## 2017-06-26 NOTE — Telephone Encounter (Signed)
Patient has an appointment on 07/03/17

## 2017-07-03 ENCOUNTER — Ambulatory Visit: Payer: Medicare Other | Admitting: Internal Medicine

## 2017-07-03 ENCOUNTER — Encounter: Payer: Self-pay | Admitting: Internal Medicine

## 2017-07-03 VITALS — BP 118/80 | HR 60 | Temp 98.1°F | Resp 16 | Ht 68.0 in | Wt 179.2 lb

## 2017-07-03 DIAGNOSIS — E785 Hyperlipidemia, unspecified: Secondary | ICD-10-CM | POA: Diagnosis not present

## 2017-07-03 DIAGNOSIS — E039 Hypothyroidism, unspecified: Secondary | ICD-10-CM

## 2017-07-03 DIAGNOSIS — Z1159 Encounter for screening for other viral diseases: Secondary | ICD-10-CM | POA: Insufficient documentation

## 2017-07-03 DIAGNOSIS — R0789 Other chest pain: Secondary | ICD-10-CM

## 2017-07-03 DIAGNOSIS — Z0000 Encounter for general adult medical examination without abnormal findings: Secondary | ICD-10-CM

## 2017-07-03 DIAGNOSIS — I10 Essential (primary) hypertension: Secondary | ICD-10-CM

## 2017-07-03 DIAGNOSIS — Z23 Encounter for immunization: Secondary | ICD-10-CM | POA: Diagnosis not present

## 2017-07-03 DIAGNOSIS — Z136 Encounter for screening for cardiovascular disorders: Secondary | ICD-10-CM | POA: Diagnosis not present

## 2017-07-03 DIAGNOSIS — Z0001 Encounter for general adult medical examination with abnormal findings: Secondary | ICD-10-CM

## 2017-07-03 DIAGNOSIS — I2510 Atherosclerotic heart disease of native coronary artery without angina pectoris: Secondary | ICD-10-CM

## 2017-07-03 DIAGNOSIS — N400 Enlarged prostate without lower urinary tract symptoms: Secondary | ICD-10-CM

## 2017-07-03 MED ORDER — ZOSTER VAC RECOMB ADJUVANTED 50 MCG/0.5ML IM SUSR: 0.5000 mL | Freq: Once | INTRAMUSCULAR | 1 refills | Status: AC

## 2017-07-03 NOTE — Progress Notes (Signed)
Subjective:  Patient ID: Samuel Flowers, male    DOB: [birth year]  Age: 67 y.o. MRN: [id number]  CC: Coronary Artery Disease; Hypothyroidism; Hyperlipidemia; and Annual Exam   HPI Samuel Flowers presents for a CPX.  He complains of chronic chest tightness after he has been walking for several minutes.  This is not a new symptom for him.  He has an appointment soon with his cardiologist.  He denies any recent episodes of diaphoresis, DOE, shortness of breath, cough, or hemoptysis.  He is tolerating his statin well though he does complain that he has mild, intermittent muscle aches.   Past Medical History:  Diagnosis Date  . Acute asthmatic bronchitis   . Allergy   . BPH (benign prostatic hypertrophy)    mild  . C. difficile colitis   . CAD (coronary artery disease)   . Colitis   . Fatty liver   . Hyperlipemia   . Hypothyroidism   . IBS (irritable bowel syndrome)   . Nonspecific colitis   . Rhinitis, allergic    Past Surgical History:  Procedure Laterality Date  . CORONARY ARTERY BYPASS GRAFT  2002  . EYE SURGERY    . [place]    reports that  has never smoked. he has never used smokeless tobacco. He reports that he drinks about 1.8 oz of alcohol per week. He reports that he does not use drugs. family history includes Colon cancer in his paternal aunt; Colon polyps in his father; Coronary artery disease in his father and mother; Diabetes in his father; Heart disease in his father; Hyperlipidemia in his father; Mental illness in his mother. No Known Allergies  Outpatient Medications Prior to Visit  Medication Sig Dispense Refill  . aspirin 81 MG tablet Take 81 mg by mouth daily.    . fenofibrate (TRICOR) 145 MG tablet TAKE 1 TABLET BY MOUTH  DAILY 90 tablet 3  . loratadine (CLARITIN) 10 MG tablet Take 10 mg by mouth daily.    . rosuvastatin (CRESTOR) 10 MG tablet TAKE 1 TABLET BY MOUTH  DAILY 90 tablet 3  . amoxicillin (AMOXIL) 875 MG tablet Take 1 tablet (875  mg total) by mouth 2 (two) times daily. Please allow ten days of total illness before filling to allow a virus to run its course.  OK to fill sooner if fever greater than 100.4. 20 tablet 0  . finasteride (PROSCAR) 5 MG tablet Take 5 mg by mouth daily.  1   No facility-administered medications prior to visit.     ROS Review of Systems  Constitutional: Negative.  Negative for appetite change, diaphoresis, fatigue and unexpected weight change.  HENT: Negative.  Negative for sinus pressure, sore throat and trouble swallowing.   Eyes: Negative.   Respiratory: Positive for chest tightness. Negative for cough, shortness of breath, wheezing and stridor.   Cardiovascular: Negative for chest pain, palpitations and leg swelling.  Gastrointestinal: Negative.  Negative for abdominal pain, blood in stool, constipation, diarrhea, nausea and vomiting.  Endocrine: Negative for cold intolerance and heat intolerance.  Genitourinary: Negative for difficulty urinating, dysuria, hematuria, testicular pain and urgency.  Musculoskeletal: Positive for myalgias. Negative for arthralgias and back pain.  Skin: Negative.  Negative for color change, pallor and rash.  Allergic/Immunologic: Negative.   Neurological: Negative.  Negative for dizziness, weakness, light-headedness, numbness and headaches.  Hematological: Negative for adenopathy. Does not bruise/bleed easily.  Psychiatric/Behavioral: Negative.  The patient is not hyperactive.     Objective:  BP 118/80 (  BP Location: Left Arm, Patient Position: Sitting, Cuff Size: Normal)   Pulse 60   Temp 98.1 F (36.7 C) (Oral)   Resp 16   Ht 5\' 8"  (1.727 m)   Wt 179 lb 4 oz (81.3 kg)   SpO2 97%   BMI 27.25 kg/m   BP Readings from Last 3 Encounters:  07/03/17 118/80  05/09/16 122/80  03/21/16 121/80    Wt Readings from Last 3 Encounters:  07/03/17 179 lb 4 oz (81.3 kg)  05/09/16 173 lb (78.5 kg)  03/28/16 171 lb (77.6 kg)    Physical Exam    Constitutional: He is oriented to person, place, and time. No distress.  HENT:  Mouth/Throat: Oropharynx is clear and moist. No oropharyngeal exudate.  Eyes: Conjunctivae are normal. Right eye exhibits no discharge. Left eye exhibits no discharge. No scleral icterus.  Neck: Normal range of motion. Neck supple. No JVD present. No thyromegaly present.  Cardiovascular: Normal rate, regular rhythm and normal heart sounds. Exam reveals no gallop.  No murmur heard. EKG -  Sinus  Bradycardia  -RSR(V1) -nondiagnostic.   PROBABLY NORMAL  Pulmonary/Chest: Effort normal and breath sounds normal. No respiratory distress. He has no wheezes. He has no rales.  Abdominal: Soft. Bowel sounds are normal. He exhibits no distension and no mass. There is no tenderness. There is no rebound and no guarding. Hernia confirmed negative in the right inguinal area and confirmed negative in the left inguinal area.  Genitourinary: Rectum normal, testes normal and penis normal. Rectal exam shows no external hemorrhoid, no internal hemorrhoid, no fissure, no mass, no tenderness, anal tone normal and guaiac negative stool. Prostate is enlarged (1+ BPH). Prostate is not tender. Right testis shows no mass, no swelling and no tenderness. Left testis shows no mass, no swelling and no tenderness. Circumcised. No penile erythema or penile tenderness. No discharge found.  Musculoskeletal: Normal range of motion. He exhibits no edema, tenderness or deformity.  Lymphadenopathy:    He has no cervical adenopathy.       Right: No inguinal adenopathy present.       Left: No inguinal adenopathy present.  Neurological: He is alert and oriented to person, place, and time.  Skin: Skin is warm and dry. No rash noted. He is not diaphoretic. No erythema. No pallor.  Psychiatric: He has a normal mood and affect. His behavior is normal. Judgment and thought content normal.  Vitals reviewed.   Lab Results  Component Value Date   WBC 8.8  05/10/2014   HGB 13.8 05/10/2014   HCT 42.3 05/10/2014   PLT 250.0 05/10/2014   GLUCOSE 72 07/27/2014   CHOL 140 04/01/2016   TRIG 142 04/01/2016   HDL 55 04/01/2016   LDLDIRECT 143.9 03/09/2013   LDLCALC 57 04/01/2016   ALT 18 04/01/2016   AST 24 04/01/2016   NA 141 07/27/2014   K 4.1 07/27/2014   CL 105 07/27/2014   CREATININE 1.17 07/27/2014   BUN 21 07/27/2014   CO2 32 07/27/2014   TSH 4.35 03/09/2013   PSA 0.71 10/30/2011     Visual Acuity Screening   Right eye Left eye Both eyes  Without correction: 20/25 20/50 20/25   With correction: 20/30 20/50 20/25   Hearing Screening Comments: Patient passed whisper test   No results found.  Assessment & Plan:   Samuel Flowers was seen today for coronary artery disease, hypothyroidism, hyperlipidemia and annual exam.  Diagnoses and all orders for this visit:  Routine general medical examination at a  health care facility  Need for influenza vaccination -     Flu vaccine HIGH DOSE PF (Fluzone High dose)  Need for pneumococcal vaccination -     Pneumococcal conjugate vaccine 13-valent  Need for shingles vaccine -     Zoster Vaccine Adjuvanted [facility]) injection; Inject 0.5 mLs into the muscle once for 1 dose.  Acquired hypothyroidism- His TSH is in the acceptable range and he has no symptoms related to this.  Thyroid replacement therapy is not indicated at this time. -     Thyroid Panel With TSH; Future  Hyperlipidemia LDL goal <70- He has achieved his LDL goal.  He has mild muscle aches that are tolerable but his CPK level is normal.  Will continue the fenofibrate and rosuvastatin at the current doses. -     Lipid panel; Future -     CK; Future  Atherosclerosis of native coronary artery of native heart without angina pectoris- He has mild stable chest tightness with exertion.  His EKG is negative for ischemic changes.  He will follow up with his cardiologist soon but at this time I do not think he needs any further workup for  ischemia. -     Lipid panel; Future  Essential hypertension- His blood pressure is adequately well controlled.  Electrolytes and renal function are normal. -     Comprehensive metabolic panel; Future -     CBC with Differential/Platelet; Future  Need for hepatitis C screening test -     Hepatitis C antibody; Future  Benign prostatic hyperplasia without lower urinary tract symptoms- His PSA remains low so I am not concerned about prostate cancer.  He has no symptoms that need to be treated. -     PSA; Future  Chest tightness- as above -     EKG 12-Lead   I have discontinued [facility] Sawin's finasteride and amoxicillin. I am also having him start on Zoster Vaccine Adjuvanted. Additionally, I am having him maintain his aspirin, loratadine, rosuvastatin, and fenofibrate.  Meds ordered this encounter  Medications  . Zoster Vaccine Adjuvanted [facility]) injection    Sig: Inject 0.5 mLs into the muscle once for 1 dose.    Dispense:  0.5 mL    Refill:  1   See AVS for instructions about healthy living and anticipatory guidance.  Follow-up: No Follow-up on file.  Scarlette Calico, MD

## 2017-07-03 NOTE — Assessment & Plan Note (Signed)
Advanced directives and end-of-life care was discussed with the patient.  He chooses not to make any decisions about this at this time.  The written screening recommendations is given to patient and attached in the patent instructions or AVS.   The patient is here for annual Medicare wellness examination and management of other chronic and acute problems.   The risk factors are reflected in the social history.  The roster of all physicians providing medical care to patient - is listed in the Snapshot section of the chart.  Activities of daily living:  The patient is 100% inedpendent in all ADLs: dressing, toileting, feeding as well as independent mobility  Home safety : The patient has smoke detectors in the home. They wear seatbelts.No firearms at home ( firearms are present in the home, kept in a safe fashion). There is no violence in the home.   There is no risks for hepatitis, STDs or HIV. There is no   history of blood transfusion. They have no travel history to infectious disease endemic areas of the world.  The patient has (has not) seen their dentist in the last six month. They have (not) seen their eye doctor in the last year. They deny (admit to) any hearing difficulty and have not had audiologic testing in the last year.  They do not  have excessive sun exposure. Discussed the need for sun protection: hats, long sleeves and use of sunscreen if there is significant sun exposure.   Diet: the importance of a healthy diet is discussed. They do have a healthy (unhealthy-high fat/fast food) diet.  The patient has a regular exercise program.  The benefits of regular aerobic exercise were discussed.  Depression screen: there are no signs or vegative symptoms of depression- irritability, change in appetite, anhedonia, sadness/tearfullness.  Cognitive assessment: the patient manages all their financial and personal affairs and is actively engaged. They could relate day,date,year and events;  recalled 3/3 objects at 3 minutes; performed clock-face test normally.  The following portions of the patient's history were reviewed and updated as appropriate: allergies, current medications, past family history, past medical history,  past surgical history, past social history  and problem list.  Vision, hearing, body mass index were assessed and reviewed.   During the course of the visit the patient was educated and counseled about appropriate screening and preventive services including : fall prevention , diabetes screening, nutrition counseling, colorectal cancer screening, and recommended immunizations.

## 2017-07-03 NOTE — Patient Instructions (Signed)

## 2017-07-06 ENCOUNTER — Encounter: Payer: Self-pay | Admitting: Internal Medicine

## 2017-07-17 ENCOUNTER — Telehealth: Payer: Self-pay | Admitting: Cardiology

## 2017-07-17 NOTE — Telephone Encounter (Signed)
Patient calling,  States that he would like lab orders sent to [facility] lab. Patient would like a follow up once completed. Patient would like to have labs completed today

## 2017-07-17 NOTE — Telephone Encounter (Signed)
Left message for patient, dr Ronnald Ramp has lab orders placed and is checking what we needed. No lab orders needed from [place].

## 2017-07-19 ENCOUNTER — Encounter (HOSPITAL_COMMUNITY): Payer: Self-pay | Admitting: Family Medicine

## 2017-07-19 ENCOUNTER — Ambulatory Visit (HOSPITAL_COMMUNITY)
Admission: EM | Admit: 2017-07-19 | Discharge: 2017-07-19 | Disposition: A | Discharge status: Home / Self Care | Payer: Medicare Other | Attending: Family Medicine | Admitting: Family Medicine

## 2017-07-19 DIAGNOSIS — M79604 Pain in right leg: Secondary | ICD-10-CM

## 2017-07-19 NOTE — ED Provider Notes (Signed)
  [place]    CSN: [id number] Arrival date & time: 07/19/17  1728  Musculoskeletal Exam  Patient: Samuel Flowers DOB: [birth year]  DOS: 07/19/2017  SUBJECTIVE:  Chief Complaint:   Chief Complaint  Patient presents with  . Leg Pain    Samuel Flowers is a 67 y.o.  male for evaluation and treatment of L leg pain.   Onset:  1 day ago. Started after doing incline walking on treadmill, which he does not normally do Location: lateral left leg Character:  aching  Progression of issue:  is unchanged Associated symptoms: felt some swelling Treatment: to date has been OTC NSAIDS which have been helpful.   Neurovascular symptoms: no  ROS: Musculoskeletal/Extremities: +LLE pain  Past Medical History:  Diagnosis Date  . Acute asthmatic bronchitis   . Allergy   . BPH (benign prostatic hypertrophy)    mild  . C. difficile colitis   . CAD (coronary artery disease)   . Colitis   . Fatty liver   . Hyperlipemia   . Hypothyroidism   . IBS (irritable bowel syndrome)   . Nonspecific colitis   . Rhinitis, allergic     Objective: VITAL SIGNS: BP 116/72   Pulse (!) 52   Temp 98.1 F (36.7 C)   Resp 18   SpO2 97%  Constitutional: Well formed, well developed. No acute distress. Cardiovascular: Brisk cap refill, 2+ DP pulses b/l, no edema Thorax & Lungs: No accessory muscle use Musculoskeletal: LLE.   Normal active range of motion: yes.   Normal passive range of motion: yes Tenderness to palpation: Yes, over lateral compartment around 3/5 from knee Deformity: no Ecchymosis: no Tests positive: None Tests negative: Squeeze, ant drawer of ankle, Homan's Mild ttp over b/l calves.  Neurologic: Normal sensory function. No focal deficits noted.  Psychiatric: Normal mood. Age appropriate judgment and insight. Alert & oriented x 3.    Assessment:  Right leg pain  Plan: Heat, ice, intermittent NSAID use, Tylenol, massage. Cross training to avoid overuse injuries rec'd. I  do not think this is a clot. He is physically active and had a change in activity suggesting a msk etiology.  F/u with pcp prn.  The patient voiced understanding and agreement to the plan.    Shelda Pal, [place] 07/19/17 2155

## 2017-07-19 NOTE — Discharge Instructions (Signed)
Ice/cold pack over area for 10-15 min twice daily.  Heat (pad or rice pillow in microwave) over affected area, 10-15 minutes twice daily.   OK to take Tylenol 1000 mg (2 extra strength tabs) or 975 mg (3 regular strength tabs) every 6 hours as needed.  Ibuprofen 400-600 mg (2-3 over the counter strength tabs) every 6 hours as needed for pain.  Stay active. Consider cross training to avoid overuse injuries.

## 2017-07-19 NOTE — ED Triage Notes (Addendum)
Pt here for swelling to lateral portion of LLE and enlarged veins to LLE. sts some discoloration. He has a hx of CVD. Painful to flexion and extension of foot. sts that he walked on the treadmill today at an incline.

## 2017-07-22 ENCOUNTER — Telehealth: Payer: Self-pay | Admitting: Cardiology

## 2017-07-22 NOTE — Telephone Encounter (Signed)
New Message   Patient is calling in reference to his appointment on 09/01/2017. He wants to know whether or not Dr. Stanford Breed wants him to have labs prior to the visit. Please call to discuss.

## 2017-07-22 NOTE — Telephone Encounter (Signed)
Returned call to patient. Explained that his PCP has ordered most labs that Dr. Stanford Breed would need to see. He requested a C-reactive protein also be added. Will check with MD to see if this is necessary.  Patient expressed concern about issues with his left leg, concerned about circulation. He reports a "knot" on his left calf & swelling. He does not report any injury to leg. He has recently walked on a treadmill uphill. No recent extended travel. He had this pain (which is 2 out of 10) since at least 07/19/17 when he went to [facility]. Explained that this pain does not sound cardiac but he would like MD to review his concerns.   Routed to Dr. Stanford Breed

## 2017-07-23 NOTE — Telephone Encounter (Signed)
Have all labs I need and will discuss at ov; fu primary care for leg pain [name]

## 2017-07-23 NOTE — Telephone Encounter (Signed)
Left detailed message with MD advice noted below (ok per DPR)

## 2017-07-30 ENCOUNTER — Encounter: Payer: Self-pay | Admitting: Family

## 2017-07-30 ENCOUNTER — Ambulatory Visit: Payer: Medicare Other | Admitting: Family

## 2017-07-30 VITALS — BP 116/78 | HR 60 | Temp 98.7°F | Ht 68.0 in | Wt 180.0 lb

## 2017-07-30 DIAGNOSIS — M79662 Pain in left lower leg: Secondary | ICD-10-CM

## 2017-07-30 MED ORDER — MELOXICAM 15 MG PO TABS: 15.0000 mg | ORAL_TABLET | Freq: Every day | ORAL | 0 refills | Status: DC

## 2017-07-30 NOTE — Patient Instructions (Signed)
Phlebitis Phlebitis is soreness and swelling (inflammation) of a vein. This can occur in your arms, legs, or torso (trunk), as well as deeper inside your body. Phlebitis is usually not serious when it occurs close to the surface of the body. However, it can cause serious problems when it occurs in a vein deeper inside the body. What are the causes? Phlebitis can be triggered by various things, including:  Reduced blood flow through your veins. This can happen with: ? Bed rest over a long period. ? Long-distance travel. ? Injury. ? Surgery. ? Being overweight (obese) or pregnant.  Having an IV tube put in the vein and getting certain medicines through the vein.  Cancer and cancer treatment.  Use of illegal drugs taken through the vein.  Inflammatory diseases.  Inherited (genetic) diseases that increase the risk of blood clots.  Hormone therapy, such as birth control pills.  What are the signs or symptoms?  Red, tender, swollen, and painful area on your skin. Usually, the area will be long and narrow.  Firmness along the center of the affected area. This can indicate that a blood clot has formed.  Low-grade fever. How is this diagnosed? A health care provider can usually diagnose phlebitis by examining the affected area and asking about your symptoms. To check for infection or blood clots, your health care provider may order blood tests or an ultrasound exam of the area. Blood tests and your family history may also indicate if you have an underlying genetic disease that causes blood clots. Occasionally, a piece of tissue is taken from the body (biopsy sample) if an unusual cause of phlebitis is suspected. How is this treated? Treatment will vary depending on the severity of the condition and the area of the body affected. Treatment may include:  Use of a warm compress or heating pad.  Use of compression stockings or bandages.  Anti-inflammatory medicines.  Removal of any IV  tube that may be causing the problem.  Medicines that kill germs (antibiotics) if an infection is present.  Blood-thinning medicines if a blood clot is suspected or present.  In rare cases, surgery may be needed to remove damaged sections of vein.  Follow these instructions at home:  Only take over-the-counter or prescription medicines as directed by your health care provider. Take all medicines exactly as prescribed.  Raise (elevate) the affected area above the level of your heart as directed by your health care provider.  Apply a warm compress or heating pad to the affected area as directed by your health care provider. Do not sleep with the heating pad.  Use compression stockings or bandages as directed. These will speed healing and prevent the condition from coming back.  If you are on blood thinners: ? Get follow-up blood tests as directed by your health care provider. ? Check with your health care provider before using any new medicines. ? Carry a medical alert card or wear your medical alert jewelry to show that you are on blood thinners.  For phlebitis in the legs: ? Avoid prolonged standing or bed rest. ? Keep your legs moving. Raise your legs when sitting or lying.  Do not smoke.  Women, particularly those over the age of 35, should consider the risks and benefits of taking the contraceptive pill. This kind of hormone treatment can increase your risk for blood clots.  Follow up with your health care provider as directed. Contact a health care provider if:  You have unusual bruising or any   bleeding problems.  Your swelling or pain in the affected area is not improving.  You are on anti-inflammatory medicine, and you develop belly (abdominal) pain. Get help right away if:  You have a sudden onset of chest pain or difficulty breathing.  You have a fever or persistent symptoms for more than 2-3 days.  You have a fever and your symptoms suddenly get worse. This  information is not intended to replace advice given to you by your health care provider. Make sure you discuss any questions you have with your health care provider. Document Released: 04/23/2001 Document Revised: 10/05/2015 Document Reviewed: 01/04/2013 Elsevier Interactive Patient Education  2017 [employer]  

## 2017-07-31 NOTE — Progress Notes (Signed)
Samuel Flowers is a 67 y.o. male with the following history as recorded in [employer]:  Patient Active Problem List   Diagnosis Date Noted  . Need for hepatitis C screening test 07/03/2017  . Bilateral plantar fasciitis 06/13/2014  . Routine general medical examination at a health care facility 10/30/2011  . Essential hypertension 01/18/2010  . Hypothyroidism 05/12/2008  . Hyperlipidemia LDL goal <70 05/12/2008  . Coronary atherosclerosis 05/12/2008  . ALLERGIC RHINITIS 05/12/2008  . Irritable bowel syndrome 05/12/2008    Current Outpatient Medications  Medication Sig Dispense Refill  . aspirin 81 MG tablet Take 81 mg by mouth daily.    . cephALEXin (KEFLEX) 500 MG capsule TAKE 1 CAPSULE 4 TIMES A DAY FOR 7 DAYS  0  . fenofibrate (TRICOR) 145 MG tablet TAKE 1 TABLET BY MOUTH  DAILY 90 tablet 3  . loratadine (CLARITIN) 10 MG tablet Take 10 mg by mouth daily.    . meloxicam (MOBIC) 15 MG tablet Take 1 tablet (15 mg total) by mouth daily. 30 tablet 0  . rosuvastatin (CRESTOR) 10 MG tablet TAKE 1 TABLET BY MOUTH  DAILY 90 tablet 3   No current facility-administered medications for this visit.     Allergies: Patient has no known allergies.  Past Medical History:  Diagnosis Date  . Acute asthmatic bronchitis   . Allergy   . BPH (benign prostatic hypertrophy)    mild  . C. difficile colitis   . CAD (coronary artery disease)   . Colitis   . Fatty liver   . Hyperlipemia   . Hypothyroidism   . IBS (irritable bowel syndrome)   . Nonspecific colitis   . Rhinitis, allergic     Past Surgical History:  Procedure Laterality Date  . CORONARY ARTERY BYPASS GRAFT  2002  . EYE SURGERY    . LUMBAR LAMINECTOMY  1990    Family History  Problem Relation Age of Onset  . Colon polyps Father   . Coronary artery disease Father   . Heart disease Father   . Diabetes Father   . Hyperlipidemia Father   . Coronary artery disease Mother   . Mental illness Mother   . Colon cancer Paternal Aunt    . Irritable bowel syndrome Neg Hx   . Stroke Neg Hx   . Alcohol abuse Neg Hx   . Cancer Neg Hx   . Depression Neg Hx   . Early death Neg Hx   . Hypertension Neg Hx     Social History   Tobacco Use  . Smoking status: Never Smoker  . Smokeless tobacco: Never Used  Substance Use Topics  . Alcohol use: Yes    Alcohol/week: 1.8 oz    Types: 3 Standard drinks or equivalent per week    Comment: occasionally    Subjective:  2 week history of lower left leg pain; started after walking on treadmill/ on incline; was seen at [facility] and thought to be muscular; patient went on vacation in [place] and was concerned about persisting symptoms/ possible DVT; doppler was normal and was started on Keflex to treat for possible cellulitis; presents today with concerns about persisting swelling; does feel that pain is improving;   Objective:  Vitals:   07/30/17 1506  BP: 116/78  Pulse: 60  Temp: 98.7 F (37.1 C)  TempSrc: Oral  SpO2: 98%  Weight: 180 lb (81.6 kg)  Height: 5\' 8"  (1.727 m)    General: Well developed, well nourished, in no acute distress  Skin :  Warm and dry.  Head: Normocephalic and atraumatic  Lungs: Respirations unlabored; clear to auscultation bilaterally without wheeze, rales, rhonchi  CVS exam: normal rate and regular rhythm.  Musculoskeletal: No deformities; no active joint inflammation; superficial veins noted at area of concern; no warmth or redness noted; dry skin noted on lower extremities bilaterally Extremities: No edema, cyanosis, clubbing  Vessels: Symmetric bilaterally  Neurologic: Alert and oriented; speech intact; face symmetrical; moves all extremities well; CNII-XII intact without focal deficit   Assessment:  1. Pain in left lower leg     Plan:  ? Superficial phlebitis vs muscular source ( possible tear); do not feel further antibiotics needed; will try Mobic 15 mg daily; and recommend applying heat to superficial veins; if pain persists, he will follow up  with sports medicine next week for imaging;   Return for Dr. Wendie Agreste on Tuesday.  No orders of the defined types were placed in this encounter.   Requested Prescriptions   Signed Prescriptions Disp Refills  . meloxicam (MOBIC) 15 MG tablet 30 tablet 0    Sig: Take 1 tablet (15 mg total) by mouth daily.

## 2017-08-01 ENCOUNTER — Other Ambulatory Visit (INDEPENDENT_AMBULATORY_CARE_PROVIDER_SITE_OTHER): Payer: Medicare Other

## 2017-08-01 DIAGNOSIS — N400 Enlarged prostate without lower urinary tract symptoms: Secondary | ICD-10-CM | POA: Diagnosis not present

## 2017-08-01 DIAGNOSIS — E785 Hyperlipidemia, unspecified: Secondary | ICD-10-CM

## 2017-08-01 DIAGNOSIS — I10 Essential (primary) hypertension: Secondary | ICD-10-CM

## 2017-08-01 DIAGNOSIS — I2510 Atherosclerotic heart disease of native coronary artery without angina pectoris: Secondary | ICD-10-CM

## 2017-08-01 DIAGNOSIS — Z1159 Encounter for screening for other viral diseases: Secondary | ICD-10-CM

## 2017-08-01 DIAGNOSIS — E039 Hypothyroidism, unspecified: Secondary | ICD-10-CM

## 2017-08-01 LAB — COMPREHENSIVE METABOLIC PANEL
ALBUMIN: 4.2 g/dL (ref 3.5–5.2)
ALK PHOS: 40 U/L (ref 39–117)
ALT: 26 U/L (ref 0–53)
AST: 27 U/L (ref 0–37)
BILIRUBIN TOTAL: 0.4 mg/dL (ref 0.2–1.2)
BUN: 27 mg/dL — ABNORMAL HIGH (ref 6–23)
CO2: 27 mEq/L (ref 19–32)
Calcium: 9.6 mg/dL (ref 8.4–10.5)
Chloride: 106 mEq/L (ref 96–112)
Creatinine, Ser: 1.06 mg/dL (ref 0.40–1.50)
GFR: 74.3 mL/min (ref >60.00)
GLUCOSE: 99 mg/dL (ref 70–99)
POTASSIUM: 4.5 meq/L (ref 3.5–5.1)
Sodium: 140 mEq/L (ref 135–145)
TOTAL PROTEIN: 6.8 g/dL (ref 6.0–8.3)

## 2017-08-01 LAB — CBC WITH DIFFERENTIAL/PLATELET
BASOS ABS: 0 10*3/uL (ref 0.0–0.1)
Basophils Relative: 0.9 % (ref 0.0–3.0)
EOS PCT: 5.1 % — AB (ref 0.0–5.0)
Eosinophils Absolute: 0.2 10*3/uL (ref 0.0–0.7)
HCT: 43.4 % (ref 39.0–52.0)
HEMOGLOBIN: 15 g/dL (ref 13.0–17.0)
LYMPHS ABS: 1.3 10*3/uL (ref 0.7–4.0)
Lymphocytes Relative: 31.3 % (ref 12.0–46.0)
MCHC: 34.5 g/dL (ref 30.0–36.0)
MCV: 92.6 fl (ref 78.0–100.0)
MONOS PCT: 13.2 % — AB (ref 3.0–12.0)
Monocytes Absolute: 0.5 10*3/uL (ref 0.1–1.0)
NEUTROS PCT: 49.5 % (ref 43.0–77.0)
Neutro Abs: 2 10*3/uL (ref 1.4–7.7)
Platelets: 198 10*3/uL (ref 150.0–400.0)
RBC: 4.69 Mil/uL (ref 4.22–5.81)
RDW: 13.2 % (ref 11.5–15.5)
WBC: 4.1 10*3/uL (ref 4.0–10.5)

## 2017-08-01 LAB — LIPID PANEL
CHOLESTEROL: 132 mg/dL (ref 0–200)
HDL: 47.4 mg/dL (ref >39.00)
LDL Cholesterol: 53 mg/dL (ref 0–99)
NonHDL: 85.05
Total CHOL/HDL Ratio: 3
Triglycerides: 162 mg/dL — ABNORMAL HIGH (ref 0.0–149.0)
VLDL: 32.4 mg/dL (ref 0.0–40.0)

## 2017-08-01 LAB — CK: CK TOTAL: 143 U/L (ref 7–232)

## 2017-08-01 LAB — PSA: PSA: 1.47 ng/mL (ref 0.10–4.00)

## 2017-08-03 LAB — HEPATITIS C ANTIBODY
Hepatitis C Ab: NONREACTIVE
SIGNAL TO CUT-OFF: 0.02 (ref <1.00)

## 2017-08-03 LAB — THYROID PANEL WITH TSH
Free Thyroxine Index: 1.4 (ref 1.4–3.8)
T3 UPTAKE: 30 % (ref 22–35)
T4, Total: 4.5 ug/dL — ABNORMAL LOW (ref 4.9–10.5)
TSH: 6.21 mIU/L — ABNORMAL HIGH (ref 0.40–4.50)

## 2017-08-04 ENCOUNTER — Encounter: Payer: Self-pay | Admitting: Internal Medicine

## 2017-08-05 ENCOUNTER — Ambulatory Visit: Payer: Medicare Other | Admitting: Family Medicine

## 2017-08-05 NOTE — Progress Notes (Deleted)
Samuel Flowers - 67 y.o. male MRN [id number]  Date of birth: [birth year]  SUBJECTIVE:  Including CC & ROS.  No chief complaint on file.   Samuel Flowers is a 67 y.o. male that is  ***.  ***   Review of Systems  HISTORY: Past Medical, Surgical, Social, and Family History Reviewed & Updated per EMR.   Pertinent Historical Findings include:  Past Medical History:  Diagnosis Date  . Acute asthmatic bronchitis   . Allergy   . BPH (benign prostatic hypertrophy)    mild  . C. difficile colitis   . CAD (coronary artery disease)   . Colitis   . Fatty liver   . Hyperlipemia   . Hypothyroidism   . IBS (irritable bowel syndrome)   . Nonspecific colitis   . Rhinitis, allergic     Past Surgical History:  Procedure Laterality Date  . CORONARY ARTERY BYPASS GRAFT  2002  . EYE SURGERY    . LUMBAR LAMINECTOMY  1990    No Known Allergies  Family History  Problem Relation Age of Onset  . Colon polyps Father   . Coronary artery disease Father   . Heart disease Father   . Diabetes Father   . Hyperlipidemia Father   . Coronary artery disease Mother   . Mental illness Mother   . Colon cancer Paternal Aunt   . Irritable bowel syndrome Neg Hx   . Stroke Neg Hx   . Alcohol abuse Neg Hx   . Cancer Neg Hx   . Depression Neg Hx   . Early death Neg Hx   . Hypertension Neg Hx      Social History   Socioeconomic History  . Marital status: Married    Spouse name: Not on file  . Number of children: 2  . Years of education: Not on file  . Highest education level: Not on file  Occupational History  . Occupation: Retired  Scientific laboratory technician  . Financial resource strain: Not on file  . Food insecurity:    Worry: Not on file    Inability: Not on file  . Transportation needs:    Medical: Not on file    Non-medical: Not on file  Tobacco Use  . Smoking status: Never Smoker  . Smokeless tobacco: Never Used  Substance and Sexual Activity  . Alcohol use: Yes    Alcohol/week: 1.8 oz   Types: 3 Standard drinks or equivalent per week    Comment: occasionally  . Drug use: No  . Sexual activity: Yes  Lifestyle  . Physical activity:    Days per week: Not on file    Minutes per session: Not on file  . Stress: Not on file  Relationships  . Social connections:    Talks on phone: Not on file    Gets together: Not on file    Attends religious service: Not on file    Active member of club or organization: Not on file    Attends meetings of clubs or organizations: Not on file    Relationship status: Not on file  . Intimate partner violence:    Fear of current or ex partner: Not on file    Emotionally abused: Not on file    Physically abused: Not on file    Forced sexual activity: Not on file  Other Topics Concern  . Not on file  Social History Narrative  . Not on file     PHYSICAL EXAM:  VS: There were no  vitals taken for this visit. Physical Exam Gen: NAD, alert, cooperative with exam, well-appearing ENT: normal lips, normal nasal mucosa,  Eye: normal EOM, normal conjunctiva and lids CV:  no edema, +2 pedal pulses   Resp: no accessory muscle use, non-labored,  GI: no masses or tenderness, no hernia  Skin: no rashes, no areas of induration  Neuro: normal tone, normal sensation to touch Psych:  normal insight, alert and oriented MSK:  ***      ASSESSMENT & PLAN:   No problem-specific Assessment & Plan notes found for this encounter.

## 2017-08-07 ENCOUNTER — Ambulatory Visit (INDEPENDENT_AMBULATORY_CARE_PROVIDER_SITE_OTHER): Payer: Medicare Other | Admitting: Family Medicine

## 2017-08-07 ENCOUNTER — Encounter: Payer: Self-pay | Admitting: Family Medicine

## 2017-08-07 VITALS — BP 122/82 | HR 81 | Temp 98.7°F | Ht 68.0 in | Wt 181.0 lb

## 2017-08-07 DIAGNOSIS — M79605 Pain in left leg: Secondary | ICD-10-CM | POA: Diagnosis not present

## 2017-08-07 DIAGNOSIS — M79671 Pain in right foot: Secondary | ICD-10-CM | POA: Insufficient documentation

## 2017-08-07 DIAGNOSIS — M79672 Pain in left foot: Secondary | ICD-10-CM

## 2017-08-07 NOTE — Assessment & Plan Note (Signed)
Possible for his pain to have been associated with gout or posterior tib tendinitis. Had improvement with mobic.  - mobic PRN  - counseled on compression

## 2017-08-07 NOTE — Assessment & Plan Note (Signed)
Has a bunionette on the right foot. Related to his pronation  - can benefit from custom orthotics.

## 2017-08-07 NOTE — Patient Instructions (Signed)
Please try compression over the area.  Please try to ice if you have pain  Please follow up with me to have custom orthotics made.

## 2017-08-07 NOTE — Assessment & Plan Note (Signed)
Pain at the base of the 5th MT is related to pronation. Having some swelling in that area and no trauma.  - encourage ice - has had custom orthotics previously but they have worn out. Would benefit from a new pair of custom orthotics to help with motion control.

## 2017-08-07 NOTE — Progress Notes (Signed)
Samuel Flowers - 67 y.o. male MRN [id number]  Date of birth: 07-Dec-1950  SUBJECTIVE:  Including CC & ROS.  Chief Complaint  Patient presents with  . Leg Pain    Samuel Flowers is a 67 y.o. male that is presenting with left lower leg pain. Pain has been ongoing for two weeks. Located on the lower medial aspect of the lower leg. Admits swelling was present, since has improved. He has been taking Mobic with improvement. Pain is mild after walking for long distances or standing for long periods of time. Denies any significant pain today. He was treated for a bacterial infection with antibiotics. He had a venous duplex that was negative for DVT.   Also has right lateral and left lateral foot pain. Pain on right foot is located over the 5th MTP. Pain on left foot is located at base of 5th MT. Has swelling in these areas.   Review of Systems  Constitutional: Negative for fever.  HENT: Negative for congestion.   Respiratory: Negative for cough.   Cardiovascular: Negative for chest pain.  Gastrointestinal: Negative for abdominal pain.  Musculoskeletal: Negative for gait problem.  Skin: Negative for color change.  Allergic/Immunologic: Negative for immunocompromised state.  Neurological: Negative for weakness.  Hematological: Negative for adenopathy.  Psychiatric/Behavioral: Negative for agitation.    HISTORY: Past Medical, Surgical, Social, and Family History Reviewed & Updated per EMR.   Pertinent Historical Findings include:  Past Medical History:  Diagnosis Date  . Acute asthmatic bronchitis   . Allergy   . BPH (benign prostatic hypertrophy)    mild  . C. difficile colitis   . CAD (coronary artery disease)   . Colitis   . Fatty liver   . Hyperlipemia   . Hypothyroidism   . IBS (irritable bowel syndrome)   . Nonspecific colitis   . Rhinitis, allergic     Past Surgical History:  Procedure Laterality Date  . CORONARY ARTERY BYPASS GRAFT  2002  . EYE SURGERY    . LUMBAR  LAMINECTOMY  1990    No Known Allergies  Family History  Problem Relation Age of Onset  . Colon polyps Father   . Coronary artery disease Father   . Heart disease Father   . Diabetes Father   . Hyperlipidemia Father   . Coronary artery disease Mother   . Mental illness Mother   . Colon cancer Paternal Aunt   . Irritable bowel syndrome Neg Hx   . Stroke Neg Hx   . Alcohol abuse Neg Hx   . Cancer Neg Hx   . Depression Neg Hx   . Early death Neg Hx   . Hypertension Neg Hx      Social History   Socioeconomic History  . Marital status: Married    Spouse name: Not on file  . Number of children: 2  . Years of education: Not on file  . Highest education level: Not on file  Occupational History  . Occupation: Retired  Scientific laboratory technician  . Financial resource strain: Not on file  . Food insecurity:    Worry: Not on file    Inability: Not on file  . Transportation needs:    Medical: Not on file    Non-medical: Not on file  Tobacco Use  . Smoking status: Never Smoker  . Smokeless tobacco: Never Used  Substance and Sexual Activity  . Alcohol use: Yes    Alcohol/week: 1.8 oz    Types: 3 Standard drinks or equivalent per  week    Comment: occasionally  . Drug use: No  . Sexual activity: Yes  Lifestyle  . Physical activity:    Days per week: Not on file    Minutes per session: Not on file  . Stress: Not on file  Relationships  . Social connections:    Talks on phone: Not on file    Gets together: Not on file    Attends religious service: Not on file    Active member of club or organization: Not on file    Attends meetings of clubs or organizations: Not on file    Relationship status: Not on file  . Intimate partner violence:    Fear of current or ex partner: Not on file    Emotionally abused: Not on file    Physically abused: Not on file    Forced sexual activity: Not on file  Other Topics Concern  . Not on file  Social History Narrative  . Not on file      PHYSICAL EXAM:  VS: BP 122/82 (BP Location: Left Arm, Patient Position: Sitting, Cuff Size: Normal)   Pulse 81   Temp 98.7 F (37.1 C) (Oral)   Ht 5\' 8"  (1.727 m)   Wt 181 lb (82.1 kg)   SpO2 99%   BMI 27.52 kg/m  Physical Exam Gen: NAD, alert, cooperative with exam, well-appearing ENT: normal lips, normal nasal mucosa,  Eye: normal EOM, normal conjunctiva and lids CV:  no edema, +2 pedal pulses   Resp: no accessory muscle use, non-labored,   Skin: no rashes, no areas of induration  Neuro: normal tone, normal sensation to touch Psych:  normal insight, alert and oriented MSK:  More of a cavus foot Right Foot:  Bunionette formation on the right foot. Mild loss of the transverse arch. Left foot/ankle: No TTP over the tarsal tunnel  Normal ankle ROM  Normal strength to resistance.  Swelling at the base of 5th MT  Neurovascularly intact         ASSESSMENT & PLAN:   Left foot pain Pain at the base of the 5th MT is related to pronation. Having some swelling in that area and no trauma.  - encourage ice - has had custom orthotics previously but they have worn out. Would benefit from a new pair of custom orthotics to help with motion control.   Pain of left lower extremity Possible for his pain to have been associated with gout or posterior tib tendinitis. Had improvement with mobic.  - mobic PRN  - counseled on compression   Right foot pain Has a bunionette on the right foot. Related to his pronation  - can benefit from custom orthotics.

## 2017-08-18 NOTE — Progress Notes (Signed)
HPI: FU coronary disease. Patient had coronary artery bypass and graft in 2002. His last cardiac catheterization in 2003 revealed patent grafts but there was a stenosis in a posterior lateral branch that was not grafted. Nuclear study November 2017 showed ejection fraction 62% and probable diaphragmatic attenuation; mild inferior ischemia could not be excluded..  Since he was last seen, the patient denies any dyspnea on exertion, orthopnea, PND, pedal edema, palpitations, syncope or chest pain.    Current Outpatient Medications  Medication Sig Dispense Refill  . aspirin 81 MG tablet Take 81 mg by mouth daily.    . fenofibrate (TRICOR) 145 MG tablet TAKE 1 TABLET BY MOUTH  DAILY 90 tablet 3  . loratadine (CLARITIN) 10 MG tablet Take 10 mg by mouth daily.    . rosuvastatin (CRESTOR) 10 MG tablet TAKE 1 TABLET BY MOUTH  DAILY 90 tablet 3   No current facility-administered medications for this visit.      Past Medical History:  Diagnosis Date  . Acute asthmatic bronchitis   . Allergy   . BPH (benign prostatic hypertrophy)    mild  . C. difficile colitis   . CAD (coronary artery disease)   . Colitis   . Fatty liver   . Hyperlipemia   . Hypothyroidism   . IBS (irritable bowel syndrome)   . Nonspecific colitis   . Rhinitis, allergic     Past Surgical History:  Procedure Laterality Date  . CORONARY ARTERY BYPASS GRAFT  2002  . EYE SURGERY    . LUMBAR LAMINECTOMY  1990    Social History   Socioeconomic History  . Marital status: Married    Spouse name: Not on file  . Number of children: 2  . Years of education: Not on file  . Highest education level: Not on file  Occupational History  . Occupation: Retired  Scientific laboratory technician  . Financial resource strain: Not on file  . Food insecurity:    Worry: Not on file    Inability: Not on file  . Transportation needs:    Medical: Not on file    Non-medical: Not on file  Tobacco Use  . Smoking status: Never Smoker  .  Smokeless tobacco: Never Used  Substance and Sexual Activity  . Alcohol use: Yes    Alcohol/week: 1.8 oz    Types: 3 Standard drinks or equivalent per week    Comment: occasionally  . Drug use: No  . Sexual activity: Yes  Lifestyle  . Physical activity:    Days per week: Not on file    Minutes per session: Not on file  . Stress: Not on file  Relationships  . Social connections:    Talks on phone: Not on file    Gets together: Not on file    Attends religious service: Not on file    Active member of club or organization: Not on file    Attends meetings of clubs or organizations: Not on file    Relationship status: Not on file  . Intimate partner violence:    Fear of current or ex partner: Not on file    Emotionally abused: Not on file    Physically abused: Not on file    Forced sexual activity: Not on file  Other Topics Concern  . Not on file  Social History Narrative  . Not on file    Family History  Problem Relation Age of Onset  . Colon polyps Father   .  Coronary artery disease Father   . Heart disease Father   . Diabetes Father   . Hyperlipidemia Father   . Coronary artery disease Mother   . Mental illness Mother   . Colon cancer Paternal Aunt   . Irritable bowel syndrome Neg Hx   . Stroke Neg Hx   . Alcohol abuse Neg Hx   . Cancer Neg Hx   . Depression Neg Hx   . Early death Neg Hx   . Hypertension Neg Hx     ROS: no fevers or chills, productive cough, hemoptysis, dysphasia, odynophagia, melena, hematochezia, dysuria, hematuria, rash, seizure activity, orthopnea, PND, pedal edema, claudication. Remaining systems are negative.  Physical Exam: Well-developed well-nourished in no acute distress.  Skin is warm and dry.  HEENT is normal.  Neck is supple.  Chest is clear to auscultation with normal expansion.  Cardiovascular exam is regular rate and rhythm.  Abdominal exam nontender or distended. No masses palpated. Extremities show no edema. neuro grossly  intact  ECG-July 03, 2017-sinus bradycardia with RV conduction delay.  Personally reviewed  A/P  1 coronary artery disease status post coronary artery bypass and graft-plan to continue medical therapy including aspirin and statin.  He has had no change in symptoms.  2 hyperlipidemia-plan to continue present meds.  Recent lipids from March 2019 personally reviewed.  Total cholesterol 132 with LDL 53.  Liver functions normal.  [name], MD

## 2017-09-01 ENCOUNTER — Encounter: Payer: Self-pay | Admitting: Cardiology

## 2017-09-01 ENCOUNTER — Ambulatory Visit: Payer: Medicare Other | Admitting: Cardiology

## 2017-09-01 VITALS — BP 129/80 | HR 53 | Ht 68.0 in | Wt 180.8 lb

## 2017-09-01 DIAGNOSIS — I2510 Atherosclerotic heart disease of native coronary artery without angina pectoris: Secondary | ICD-10-CM | POA: Diagnosis not present

## 2017-09-01 DIAGNOSIS — E7800 Pure hypercholesterolemia, unspecified: Secondary | ICD-10-CM

## 2017-09-01 NOTE — Patient Instructions (Signed)
Your physician wants you to follow-up in: ONE YEAR WITH DR CRENSHAW You will receive a reminder letter in the mail two months in advance. If you don't receive a letter, please call our office to schedule the follow-up appointment.   If you need a refill on your cardiac medications before your next appointment, please call your pharmacy.  

## 2018-05-07 ENCOUNTER — Other Ambulatory Visit: Payer: Self-pay | Admitting: Cardiology

## 2018-06-15 NOTE — Progress Notes (Signed)
Subjective:    Patient ID: Samuel Flowers, male    DOB: June 03, 1950, 68 y.o.   MRN: [id number]  HPI  He is here to establish with a new pcp.   He is here for a physical exam.   About 10 days ago he was playing pickleball and overextended his left lower leg and he felt like he strained a muscle.  It got better, but he still has pain.  The past couple of days his ankle has gotten swollen.  He walked this morning for an hour, but it hurt.  It is more painful with going down stairs.  He also has pain on lateral aspect of left foot.   Self caths 2-3 times a day.  Medications and allergies reviewed with patient and updated if appropriate.  Patient Active Problem List   Diagnosis Date Noted  . Onychomycosis 06/16/2018  . Pain in left lower leg 06/16/2018  . Pain of left lower extremity 08/07/2017  . Right foot pain 08/07/2017  . Left foot pain 08/07/2017  . Bilateral plantar fasciitis 06/13/2014  . Hyperlipidemia LDL goal <70 05/12/2008  . Coronary atherosclerosis 05/12/2008  . ALLERGIC RHINITIS 05/12/2008  . Irritable bowel syndrome 05/12/2008    Current Outpatient Medications on File Prior to Visit  Medication Sig Dispense Refill  . aspirin 81 MG tablet Take 81 mg by mouth daily.    . fenofibrate (TRICOR) 145 MG tablet TAKE 1 TABLET BY MOUTH  DAILY 90 tablet 0  . loratadine (CLARITIN) 10 MG tablet Take 10 mg by mouth daily.    . rosuvastatin (CRESTOR) 10 MG tablet TAKE 1 TABLET BY MOUTH  DAILY 90 tablet 0   No current facility-administered medications on file prior to visit.     Past Medical History:  Diagnosis Date  . Acute asthmatic bronchitis   . Allergy   . BPH (benign prostatic hypertrophy)    mild  . C. difficile colitis   . CAD (coronary artery disease)   . Colitis   . Fatty liver   . Hyperlipemia   . Hypothyroidism   . IBS (irritable bowel syndrome)   . Nonspecific colitis   . Rhinitis, allergic     Past Surgical History:  Procedure Laterality Date  .  CORONARY ARTERY BYPASS GRAFT  2002  . EYE SURGERY    . LUMBAR LAMINECTOMY  1990    Social History   Socioeconomic History  . Marital status: Married    Spouse name: Not on file  . Number of children: 2  . Years of education: Not on file  . Highest education level: Not on file  Occupational History  . Occupation: Retired  Scientific laboratory technician  . Financial resource strain: Not on file  . Food insecurity:    Worry: Not on file    Inability: Not on file  . Transportation needs:    Medical: Not on file    Non-medical: Not on file  Tobacco Use  . Smoking status: Never Smoker  . Smokeless tobacco: Never Used  Substance and Sexual Activity  . Alcohol use: Yes    Alcohol/week: 3.0 standard drinks    Types: 3 Standard drinks or equivalent per week    Comment: occasionally  . Drug use: No  . Sexual activity: Yes  Lifestyle  . Physical activity:    Days per week: Not on file    Minutes per session: Not on file  . Stress: Not on file  Relationships  . Social connections:  Talks on phone: Not on file    Gets together: Not on file    Attends religious service: Not on file    Active member of club or organization: Not on file    Attends meetings of clubs or organizations: Not on file    Relationship status: Not on file  Other Topics Concern  . Not on file  Social History Narrative  . Not on file    Family History  Problem Relation Age of Onset  . Colon polyps Father   . Coronary artery disease Father   . Heart disease Father   . Diabetes Father   . Hyperlipidemia Father   . Coronary artery disease Mother   . Mental illness Mother   . Colon cancer Paternal Aunt   . Irritable bowel syndrome Neg Hx   . Stroke Neg Hx   . Alcohol abuse Neg Hx   . Cancer Neg Hx   . Depression Neg Hx   . Early death Neg Hx   . Hypertension Neg Hx     Review of Systems  Constitutional: Negative for chills and fever.  Eyes: Negative for visual disturbance.  Respiratory: Negative for cough,  shortness of breath and wheezing.   Cardiovascular: Positive for leg swelling (intermittent milc). Negative for chest pain and palpitations.  Gastrointestinal: Negative for abdominal pain, blood in stool, constipation, diarrhea and nausea.       No gerd  Genitourinary: Negative for difficulty urinating, dysuria and hematuria.  Musculoskeletal: Negative for arthralgias and back pain.       Left leg pain, ankle  Skin: Negative for color change.  Neurological: Negative for dizziness, light-headedness and headaches.  Psychiatric/Behavioral: Negative for dysphoric mood. The patient is not nervous/anxious.        Objective:   Vitals:   06/16/18 1338  BP: 124/60  Pulse: (!) 58  Resp: 16  Temp: 98.4 F (36.9 C)  SpO2: 95%   Filed Weights   06/16/18 1338  Weight: 178 lb 12.8 oz (81.1 kg)   Body mass index is 27.19 kg/m.  Wt Readings from Last 3 Encounters:  06/16/18 178 lb 12.8 oz (81.1 kg)  09/01/17 180 lb 12.8 oz (82 kg)  08/07/17 181 lb (82.1 kg)     Physical Exam Constitutional: He appears well-developed and well-nourished. No distress.  HENT:  Head: Normocephalic and atraumatic.  Right Ear: External ear normal.  Left Ear: External ear normal.  Mouth/Throat: Oropharynx is clear and moist.  Normal ear canals and TM b/l  Eyes: Conjunctivae and EOM are normal.  Neck: Neck supple. No tracheal deviation present. No thyromegaly present. No carotid bruit   No edema.  Varicosities left ankle Cardiovascular: Normal rate, regular rhythm, normal heart sounds and intact distal pulses, but slightly decrease pedal pulses .   No murmur heard. Pulmonary/Chest: Effort normal and breath sounds normal. No respiratory distress. He has no wheezes. He has no rales.  Abdominal: Soft. He exhibits no distension. There is no tenderness.  Genitourinary: deferred to urology Musculoskeletal: tenderness left medial lower leg and near ankle. Non tender on achilles Lymphadenopathy:   He has no  cervical adenopathy.  Skin: Skin is warm and dry. He is not diaphoretic.  Toenails with onychomycosis Psychiatric: He has a normal mood and affect. His behavior is normal.         Assessment & Plan:   Physical exam: Screening blood work   ordered Immunizations   Flu vaccine today, discussed shingrix Colonoscopy  Up to date -  due 03/2019 Eye exams  Up to date  EKG   Done 06/2017 Exercise  Walks 1.5 hr a day Weight  BMI good for age Skin   No concerns Substance abuse   none  See Problem List for Assessment and Plan of chronic medical problems.   FU annually for a CPE

## 2018-06-15 NOTE — Patient Instructions (Addendum)
Tests ordered today. Your results will be released to [facility] (or called to you) after review, usually within 72hours after test completion. If any changes need to be made, you will be notified at that same time.  All other Health Maintenance issues reviewed.   All recommended immunizations and age-appropriate screenings are up-to-date or discussed.  No immunizations administered today.   Medications reviewed and updated.  Changes include :   none   Please followup in annually for a physical    Health Maintenance, Male A healthy lifestyle and preventive care is important for your health and wellness. Ask your health care provider about what schedule of regular examinations is right for you. What should I know about weight and diet? Eat a Healthy Diet  Eat plenty of vegetables, fruits, whole grains, low-fat dairy products, and lean protein.  Do not eat a lot of foods high in solid fats, added sugars, or salt.  Maintain a Healthy Weight Regular exercise can help you achieve or maintain a healthy weight. You should:  Do at least 150 minutes of exercise each week. The exercise should increase your heart rate and make you sweat (moderate-intensity exercise).  Do strength-training exercises at least twice a week. Watch Your Levels of Cholesterol and Blood Lipids  Have your blood tested for lipids and cholesterol every 5 years starting at 67 years of age. If you are at high risk for heart disease, you should start having your blood tested when you are 67 years old. You may need to have your cholesterol levels checked more often if: ? Your lipid or cholesterol levels are high. ? You are older than 67 years of age. ? You are at high risk for heart disease. What should I know about cancer screening? Many types of cancers can be detected early and may often be prevented. Lung Cancer  You should be screened every year for lung cancer if: ? You are a current smoker who has smoked for at  least 30 years. ? You are a former smoker who has quit within the past 15 years.  Talk to your health care provider about your screening options, when you should start screening, and how often you should be screened. Colorectal Cancer  Routine colorectal cancer screening usually begins at 67 years of age and should be repeated every 5-10 years until you are 67 years old. You may need to be screened more often if early forms of precancerous polyps or small growths are found. Your health care provider may recommend screening at an earlier age if you have risk factors for colon cancer.  Your health care provider may recommend using home test kits to check for hidden blood in the stool.  A small camera at the end of a tube can be used to examine your colon (sigmoidoscopy or colonoscopy). This checks for the earliest forms of colorectal cancer. Prostate and Testicular Cancer  Depending on your age and overall health, your health care provider may do certain tests to screen for prostate and testicular cancer.  Talk to your health care provider about any symptoms or concerns you have about testicular or prostate cancer. Skin Cancer  Check your skin from head to toe regularly.  Tell your health care provider about any new moles or changes in moles, especially if: ? There is a change in a mole's size, shape, or color. ? You have a mole that is larger than a pencil eraser.  Always use sunscreen. Apply sunscreen liberally and repeat throughout  the day.  Protect yourself by wearing long sleeves, pants, a wide-brimmed hat, and sunglasses when outside. What should I know about heart disease, diabetes, and high blood pressure?  If you are 33-23 years of age, have your blood pressure checked every 3-5 years. If you are 68 years of age or older, have your blood pressure checked every year. You should have your blood pressure measured twice-once when you are at a hospital or clinic, and once when you are  not at a hospital or clinic. Record the average of the two measurements. To check your blood pressure when you are not at a hospital or clinic, you can use: ? An automated blood pressure machine at a pharmacy. ? A home blood pressure monitor.  Talk to your health care provider about your target blood pressure.  If you are between 18-25 years old, ask your health care provider if you should take aspirin to prevent heart disease.  Have regular diabetes screenings by checking your fasting blood sugar level. ? If you are at a normal weight and have a low risk for diabetes, have this test once every three years after the age of 65. ? If you are overweight and have a high risk for diabetes, consider being tested at a younger age or more often.  A one-time screening for abdominal aortic aneurysm (AAA) by ultrasound is recommended for men aged 74-75 years who are current or former smokers. What should I know about preventing infection? Hepatitis B If you have a higher risk for hepatitis B, you should be screened for this virus. Talk with your health care provider to find out if you are at risk for hepatitis B infection. Hepatitis C Blood testing is recommended for:  Everyone born from [birth year] through 1964.  Anyone with known risk factors for hepatitis C. Sexually Transmitted Diseases (STDs)  You should be screened each year for STDs including gonorrhea and chlamydia if: ? You are sexually active and are younger than 67 years of age. ? You are older than 67 years of age and your health care provider tells you that you are at risk for this type of infection. ? Your sexual activity has changed since you were last screened and you are at an increased risk for chlamydia or gonorrhea. Ask your health care provider if you are at risk.  Talk with your health care provider about whether you are at high risk of being infected with HIV. Your health care provider may recommend a prescription medicine to help  prevent HIV infection. What else can I do?  Schedule regular health, dental, and eye exams.  Stay current with your vaccines (immunizations).  Do not use any tobacco products, such as cigarettes, chewing tobacco, and e-cigarettes. If you need help quitting, ask your health care provider.  Limit alcohol intake to no more than 2 drinks per day. One drink equals 12 ounces of beer, 5 ounces of wine, or 1 ounces of hard liquor.  Do not use street drugs.  Do not share needles.  Ask your health care provider for help if you need support or information about quitting drugs.  Tell your health care provider if you often feel depressed.  Tell your health care provider if you have ever been abused or do not feel safe at home. This information is not intended to replace advice given to you by your health care provider. Make sure you discuss any questions you have with your health care provider. Document Released: 10/26/2007 Document  Revised: 12/27/2015 Document Reviewed: 01/31/2015 Elsevier Interactive Patient Education  [employer].

## 2018-06-16 ENCOUNTER — Ambulatory Visit (INDEPENDENT_AMBULATORY_CARE_PROVIDER_SITE_OTHER): Payer: Medicare Other | Admitting: Internal Medicine

## 2018-06-16 ENCOUNTER — Encounter: Payer: Self-pay | Admitting: Internal Medicine

## 2018-06-16 VITALS — BP 124/60 | HR 58 | Temp 98.4°F | Resp 16 | Ht 68.0 in | Wt 178.8 lb

## 2018-06-16 DIAGNOSIS — R339 Retention of urine, unspecified: Secondary | ICD-10-CM | POA: Insufficient documentation

## 2018-06-16 DIAGNOSIS — Z0000 Encounter for general adult medical examination without abnormal findings: Secondary | ICD-10-CM

## 2018-06-16 DIAGNOSIS — M79662 Pain in left lower leg: Secondary | ICD-10-CM | POA: Insufficient documentation

## 2018-06-16 DIAGNOSIS — E785 Hyperlipidemia, unspecified: Secondary | ICD-10-CM

## 2018-06-16 DIAGNOSIS — I2510 Atherosclerotic heart disease of native coronary artery without angina pectoris: Secondary | ICD-10-CM

## 2018-06-16 DIAGNOSIS — Z23 Encounter for immunization: Secondary | ICD-10-CM

## 2018-06-16 DIAGNOSIS — B351 Tinea unguium: Secondary | ICD-10-CM | POA: Insufficient documentation

## 2018-06-16 MED ORDER — CICLOPIROX 8 % EX SOLN: Freq: Every day | CUTANEOUS | 6 refills | Status: DC

## 2018-06-16 NOTE — Assessment & Plan Note (Signed)
Sees Dr Stanford Breed Asymptomatic Continue ASA, statin Cbc, cmp, tsh, lipids

## 2018-06-16 NOTE — Assessment & Plan Note (Signed)
Taking meloxicam  Will refer to sports med for further evaluation

## 2018-06-16 NOTE — Assessment & Plan Note (Signed)
Follows with urology annually Bladder retention Self caths 2-3 times a day

## 2018-06-16 NOTE — Assessment & Plan Note (Signed)
Check lipid panel  Continue daily statin, tricor Regular exercise and healthy diet encouraged

## 2018-06-16 NOTE — Addendum Note (Signed)
Addended by: Delice Bison E on: 06/16/2018 03:54 PM   Modules accepted: Orders

## 2018-06-16 NOTE — Assessment & Plan Note (Signed)
Both feet - most nails otc meds not working Will try ciclopirox

## 2018-07-08 ENCOUNTER — Ambulatory Visit: Payer: Medicare Other | Admitting: Family Medicine

## 2018-07-28 ENCOUNTER — Ambulatory Visit: Payer: Medicare Other | Admitting: Family Medicine

## 2018-07-29 ENCOUNTER — Ambulatory Visit: Payer: Self-pay

## 2018-07-29 ENCOUNTER — Ambulatory Visit: Payer: Medicare Other | Admitting: Family Medicine

## 2018-07-29 ENCOUNTER — Encounter: Payer: Self-pay | Admitting: Family Medicine

## 2018-07-29 ENCOUNTER — Other Ambulatory Visit: Payer: Self-pay

## 2018-07-29 VITALS — BP 120/80 | HR 57 | Ht 68.0 in | Wt 176.0 lb

## 2018-07-29 DIAGNOSIS — S86112S Strain of other muscle(s) and tendon(s) of posterior muscle group at lower leg level, left leg, sequela: Secondary | ICD-10-CM | POA: Insufficient documentation

## 2018-07-29 DIAGNOSIS — M79662 Pain in left lower leg: Secondary | ICD-10-CM

## 2018-07-29 MED ORDER — MELOXICAM 15 MG PO TABS: 15.0000 mg | ORAL_TABLET | Freq: Every day | ORAL | 0 refills | Status: DC

## 2018-07-29 NOTE — Patient Instructions (Addendum)
Good to see you  Think of it as a bad calf tear Ice 20 minutes 2 times daily. Usually after activity and before bed. Heel lift in the shoe daily  Calf compression daily for 1-2 weeks then with working out for another month.  Walking ok but listen to your body  pennsaid pinkie amount topically 2 times daily as needed.  Meloxicam daily for 10 days then as needed See me agai in 6 weeks

## 2018-07-29 NOTE — Progress Notes (Signed)
[facility] [address], [place] [address] Phone: [phone] Subjective:    I'm seeing this patient by the request  of:    CC:  Left leg pain   [id number]  Samuel Flowers is a 68 y.o. male coming in with complaint of left leg pain. Was playing pickle ball when he felt pain in his calf. Pain has gotten better. Believes he has pulled the muscle. Walks about 6-9 miles a day.   Onset- 6-7 weeks  Location- Achilles  Duration-  Character- sharp, achy  Aggravating factors- walking, standing plantar flexion, stairs Reliving factors-responding to meloxicam Therapies tried- Ibuprofen, maloxicam, ice water   Severity-7 out of 10 initially now 3 out of 10     Past Medical History:  Diagnosis Date  . Acute asthmatic bronchitis   . Allergy   . BPH (benign prostatic hypertrophy)    mild  . C. difficile colitis   . CAD (coronary artery disease)   . Colitis   . Fatty liver   . Hyperlipemia   . Hypothyroidism   . IBS (irritable bowel syndrome)   . Nonspecific colitis   . Rhinitis, allergic    Past Surgical History:  Procedure Laterality Date  . CORONARY ARTERY BYPASS GRAFT  2002  . EYE SURGERY    . LUMBAR LAMINECTOMY  1990   Social History   Socioeconomic History  . Marital status: Married    Spouse name: Not on file  . Number of children: 2  . Years of education: Not on file  . Highest education level: Not on file  Occupational History  . Occupation: Retired  Scientific laboratory technician  . Financial resource strain: Not on file  . Food insecurity:    Worry: Not on file    Inability: Not on file  . Transportation needs:    Medical: Not on file    Non-medical: Not on file  Tobacco Use  . Smoking status: Never Smoker  . Smokeless tobacco: Never Used  Substance and Sexual Activity  . Alcohol use: Yes    Alcohol/week: 3.0 standard drinks    Types: 3 Standard drinks or equivalent per week    Comment: occasionally  . Drug use: No  . Sexual  activity: Yes  Lifestyle  . Physical activity:    Days per week: Not on file    Minutes per session: Not on file  . Stress: Not on file  Relationships  . Social connections:    Talks on phone: Not on file    Gets together: Not on file    Attends religious service: Not on file    Active member of club or organization: Not on file    Attends meetings of clubs or organizations: Not on file    Relationship status: Not on file  Other Topics Concern  . Not on file  Social History Narrative  . Not on file   No Known Allergies Family History  Problem Relation Age of Onset  . Colon polyps Father   . Coronary artery disease Father   . Heart disease Father   . Diabetes Father   . Hyperlipidemia Father   . Coronary artery disease Mother   . Mental illness Mother   . Colon cancer Paternal Aunt   . Irritable bowel syndrome Neg Hx   . Stroke Neg Hx   . Alcohol abuse Neg Hx   . Cancer Neg Hx   . Depression Neg Hx   . Early death  Neg Hx   . Hypertension Neg Hx      Current Outpatient Medications (Cardiovascular):  .  fenofibrate (TRICOR) 145 MG tablet, TAKE 1 TABLET BY MOUTH  DAILY .  rosuvastatin (CRESTOR) 10 MG tablet, TAKE 1 TABLET BY MOUTH  DAILY  Current Outpatient Medications (Respiratory):  .  loratadine (CLARITIN) 10 MG tablet, Take 10 mg by mouth daily.  Current Outpatient Medications (Analgesics):  .  aspirin 81 MG tablet, Take 81 mg by mouth daily. .  meloxicam (MOBIC) 15 MG tablet, Take 1 tablet (15 mg total) by mouth daily.   Current Outpatient Medications (Other):  .  ciclopirox (PENLAC) 8 % solution, Apply topically at bedtime. Apply over nail and surrounding skin. Apply daily over previous coat. After seven (7) days, may remove with alcohol and continue cycle.    Past medical history, social, surgical and family history all reviewed in electronic medical record.  No pertanent information unless stated regarding to the chief complaint.   Review of Systems:   No headache, visual changes, nausea, vomiting, diarrhea, constipation, dizziness, abdominal pain, skin rash, fevers, chills, night sweats, weight loss, swollen lymph nodes, body aches, joint swelling,  chest pain, shortness of breath, mood changes.  Positive muscle aches  Objective  Blood pressure 120/80, pulse (!) 57, height 5\' 8"  (1.727 m), weight 176 lb (79.8 kg), SpO2 96 %.    General: No apparent distress alert and oriented x3 mood and affect normal, dressed appropriately.  HEENT: Pupils equal, extraocular movements intact  Respiratory: Patient's speak in full sentences and does not appear short of breath  Cardiovascular: No lower extremity edema, non tender, no erythema  Skin: Warm dry intact with no signs of infection or rash on extremities or on axial skeleton.  Abdomen: Soft nontender  Neuro: Cranial nerves II through XII are intact, neurovascularly intact in all extremities with 2+ DTRs and 2+ pulses.  Lymph: No lymphadenopathy of posterior or anterior cervical chain or axillae bilaterally.  Gait antalgic MSK:  Non tender with full range of motion and good stability and symmetric strength and tone of shoulders, elbows, wrist, hip, knee abilaterally.  Left ankle shows some mild swelling compared to the contralateral side.  Good strength on the ankle noted.  Patient is tender to palpation approximately 2 cm proximal to the insertion of the Achilles and laterally over the distal aspect of the calf.  Limited musculoskeletal ultrasound was performed and interpreted by Lyndal Pulley  Limited ultrasound of patient's calf shows the patient did have a soleus tear and a partial deep fibers of the gastroc tear noted.  Achilles appears to be intact.    [id number]; 15 additional minutes spent for Therapeutic exercises as stated in above notes.  This included exercises focusing on stretching, strengthening, with significant focus on eccentric aspects.   Long term goals include an improvement in range  of motion, strength, endurance as well as avoiding reinjury. Patient's frequency would include in 1-2 times a day, 3-5 times a week for a duration of 6-12 weeks. Ankle strengthening that included:  Basic range of motion exercises to allow proper full motion at ankle Stretching of the lower leg and hamstrings  Theraband exercises for the lower leg - inversion, eversion, dorsiflexion and plantarflexion each to be completed with a theraband Balance exercises to increase proprioception Weight bearing exercises to increase strength and balance  Proper technique shown and discussed handout in great detail with ATC.  All questions were discussed and answered.   Impression and Recommendations:  This case required medical decision making of moderate complexity. The above documentation has been reviewed and is accurate and complete Lyndal Pulley, DO       Note: This dictation was prepared with Dragon dictation along with smaller phrase technology. Any transcriptional errors that result from this process are unintentional.

## 2018-07-29 NOTE — Assessment & Plan Note (Signed)
  Patient does have a small tear noted.  Seems to be at the junction with the soleus as well.  We discussed heel lift, compression, icing regimen, home exercises given and work with Product/process development scientist.  Discussed which activities to do which was to avoid.  Follow-up with me again in 4 to 8 weeks

## 2018-07-31 ENCOUNTER — Other Ambulatory Visit: Payer: Self-pay | Admitting: Cardiology

## 2018-08-13 ENCOUNTER — Telehealth: Payer: Self-pay | Admitting: *Deleted

## 2018-08-13 NOTE — Telephone Encounter (Signed)
Called and spoke to patient about scheduling an AWV. Patient stated he would prefer to do the visit in person and declined a virtual AWV. Patient ask that nurse email him with some potential dates and he will select one for her to schedule.

## 2018-09-07 ENCOUNTER — Ambulatory Visit (INDEPENDENT_AMBULATORY_CARE_PROVIDER_SITE_OTHER): Payer: Medicare Other | Admitting: Internal Medicine

## 2018-09-07 ENCOUNTER — Encounter: Payer: Self-pay | Admitting: Internal Medicine

## 2018-09-07 ENCOUNTER — Other Ambulatory Visit: Payer: Self-pay

## 2018-09-07 DIAGNOSIS — L03032 Cellulitis of left toe: Secondary | ICD-10-CM | POA: Diagnosis not present

## 2018-09-07 DIAGNOSIS — B351 Tinea unguium: Secondary | ICD-10-CM | POA: Diagnosis not present

## 2018-09-07 MED ORDER — AMOXICILLIN-POT CLAVULANATE 875-125 MG PO TABS: 1.0000 | ORAL_TABLET | Freq: Two times a day (BID) | ORAL | 0 refills | Status: DC

## 2018-09-07 MED ORDER — EFINACONAZOLE 10 % EX SOLN: CUTANEOUS | 2 refills | Status: DC

## 2018-09-07 NOTE — Progress Notes (Signed)
Virtual Visit via Video Note  I connected with Samuel Flowers on 09/07/18 at  2:00 PM EDT by a video enabled telemedicine application and verified that I am speaking with the correct person using two identifiers.   I discussed the limitations of evaluation and management by telemedicine and the availability of in person appointments. The patient expressed understanding and agreed to proceed.  The patient is currently at home and I am in the office.    No referring provider.    History of Present Illness: This is an acute visit for toenail fungus.  I saw him for a physical exam on 06/16/2018 and he did have onychomycosis on his toenails.  I prescribed ciclopirox at that time.  It has not helped much and he wondered if there was something stronger he could try.   He also has redness and tenderness on his left first toe along the nail. It is very tender and even having a sheet touch it hurts.  It has been this way for a few days and is not getting better.  The redness does not extend up to the foot.  He denies fever/chills, discharge, numbness/tingling.    He looked it up on the Internet and it said it may be an infection.     Social History   Socioeconomic History  . Marital status: Married    Spouse name: Not on file  . Number of children: 2  . Years of education: Not on file  . Highest education level: Not on file  Occupational History  . Occupation: Retired  Scientific laboratory technician  . Financial resource strain: Not on file  . Food insecurity:    Worry: Not on file    Inability: Not on file  . Transportation needs:    Medical: Not on file    Non-medical: Not on file  Tobacco Use  . Smoking status: Never Smoker  . Smokeless tobacco: Never Used  Substance and Sexual Activity  . Alcohol use: Yes    Alcohol/week: 3.0 standard drinks    Types: 3 Standard drinks or equivalent per week    Comment: occasionally  . Drug use: No  . Sexual activity: Yes  Lifestyle  . Physical activity:   Days per week: Not on file    Minutes per session: Not on file  . Stress: Not on file  Relationships  . Social connections:    Talks on phone: Not on file    Gets together: Not on file    Attends religious service: Not on file    Active member of club or organization: Not on file    Attends meetings of clubs or organizations: Not on file    Relationship status: Not on file  Other Topics Concern  . Not on file  Social History Narrative  . Not on file     Observations/Objective: Appears well in NAD Unable to visualize toes well  Assessment and Plan:  See Problem List for Assessment and Plan of chronic medical problems.   Follow Up Instructions:    I discussed the assessment and treatment plan with the patient. The patient was provided an opportunity to ask questions and all were answered. The patient agreed with the plan and demonstrated an understanding of the instructions.   The patient was advised to call back or seek an in-person evaluation if the symptoms worsen or if the condition fails to improve as anticipated.    Binnie Rail, MD

## 2018-09-07 NOTE — Assessment & Plan Note (Signed)
Symptoms c/w paronychia Epsom salt water soaks augmentin BID x 7 days Call if no improvement

## 2018-09-07 NOTE — Assessment & Plan Note (Signed)
Ciclopirox not effective Will try Jublia

## 2018-09-09 ENCOUNTER — Telehealth: Payer: Self-pay

## 2018-09-09 NOTE — Telephone Encounter (Signed)
Is there an alternative for this medication or do I need to call insurance to see?

## 2018-09-09 NOTE — Telephone Encounter (Signed)
Copied from [place] [id number]. Topic: General - Other >> Sep 09, 2018  1:40 PM Leward Quan A wrote: Reason for CRM: Patient called to say that the Efinaconazole (JUBLIA) 10 % SOLN was too expensive and he could not afford it. Asking for it to be changed please.

## 2018-09-09 NOTE — Telephone Encounter (Signed)
Jonelle Sidle from [facility] called in stating she has information regarding the Jublia 10% soln that was too expensive. States all that has to be done is call the optium prior authorization line at [phone] and do an accepting request. If patient is seeking an alternative feel free to call [phone] for [facility] provider line to see what is available.

## 2018-09-10 MED ORDER — TAVABOROLE 5 % EX SOLN: CUTANEOUS | 2 refills | Status: DC

## 2018-09-10 NOTE — Telephone Encounter (Signed)
There is one other medication we can try - sent to pof

## 2018-09-10 NOTE — Telephone Encounter (Signed)
PA for Tavaborole 5% has been initiated.   KEY [id number]

## 2018-09-11 NOTE — Telephone Encounter (Signed)
Call insurance per patients request to do a tear change for Jublia. Waiting on response from insurance.

## 2018-09-14 ENCOUNTER — Other Ambulatory Visit: Payer: Self-pay

## 2018-09-14 MED ORDER — EFINACONAZOLE 10 % EX SOLN: CUTANEOUS | 2 refills | Status: DC

## 2018-09-14 NOTE — Addendum Note (Signed)
Addended by: Binnie Rail on: 09/14/2018 04:22 PM   Modules accepted: Orders

## 2018-09-14 NOTE — Telephone Encounter (Signed)
Pt said he got a message from insurance on a denial but he doesn't know which medication it was for since he believes authorization on both were requeseted. Pt wants to continue with request for Jublia (90 day supply) if possible.   Call back # [phone]  Pt also noted he completed the ABX today. (amoxicillin-clavulanate (AUGMENTIN) 875-125 MG tablet.) Pt notes he still has mild swelling and some tenderness in his toe nail/nail bed. He is asking if he needs another round of ABX or other suggestion. Pt notes he has not been using the epsom salt soaks. Please call to advise.   [facility] [place], [address] AT [address] [phone] (Phone) [phone] (Fax)

## 2018-09-14 NOTE — Telephone Encounter (Signed)
We can do an additional week of the antibiotic-pending.  I am not able to renew the Jublia because it says is in a future encounter

## 2018-09-14 NOTE — Telephone Encounter (Signed)
Pt decided to do Jublia. rx changed back on list. Patient wanted to make sure a 90 day supply was sent because the 60 and 90 day supply are the same amount. I do not really know the math on that. Also he stated that his toe is better but it is still very tender. He is done with the antibiotic and wanted to know if it should be extended a little longer. Please advise.

## 2018-09-14 NOTE — Telephone Encounter (Signed)
LVM letting pt know that the tier change request for Jublia was denied.

## 2018-09-15 ENCOUNTER — Other Ambulatory Visit: Payer: Self-pay

## 2018-09-15 MED ORDER — AMOXICILLIN-POT CLAVULANATE 875-125 MG PO TABS: 1.0000 | ORAL_TABLET | Freq: Two times a day (BID) | ORAL | 0 refills | Status: DC

## 2018-09-15 NOTE — Addendum Note (Signed)
Addended by: Delice Bison E on: 09/15/2018 09:49 AM   Modules accepted: Orders

## 2018-09-15 NOTE — Telephone Encounter (Signed)
Pt aware of response below. Called pharmacy to figure out a 90 day supply and they had already send the order for Jublia as 90 days. They will reach out to the patient when they receive it.

## 2018-10-25 ENCOUNTER — Other Ambulatory Visit: Payer: Self-pay | Admitting: Cardiology

## 2018-11-23 ENCOUNTER — Telehealth: Payer: Self-pay

## 2018-11-23 NOTE — Telephone Encounter (Signed)
Pt would like seratonin lab work added to lab work that was placed in February.

## 2018-11-23 NOTE — Telephone Encounter (Signed)
Pt does not want to do seratonin level at this time. Will wait.

## 2018-11-23 NOTE — Telephone Encounter (Signed)
This is not a routine lab and will not be covered by insurance.

## 2018-11-23 NOTE — Telephone Encounter (Signed)
noted 

## 2018-12-14 ENCOUNTER — Other Ambulatory Visit: Payer: Self-pay | Admitting: Internal Medicine

## 2018-12-15 NOTE — Telephone Encounter (Signed)
Unfamiliar with med. Pls advise if ok to refill.Marland KitchenJohny Chess

## 2019-01-18 ENCOUNTER — Other Ambulatory Visit: Payer: Self-pay | Admitting: Cardiology

## 2019-01-19 ENCOUNTER — Other Ambulatory Visit: Payer: Self-pay | Admitting: Cardiology

## 2019-01-22 ENCOUNTER — Telehealth: Payer: Self-pay | Admitting: Gastroenterology

## 2019-01-22 NOTE — Telephone Encounter (Signed)
I am fine with that if it is okay with Dr. Fuller Plan

## 2019-01-22 NOTE — Telephone Encounter (Signed)
Dr. Fuller Plan and Silverio Decamp do you both approve of transfer.  Patient last seen by Nicoletta Ba PA in 2015

## 2019-01-25 NOTE — Telephone Encounter (Signed)
Both MDs agree

## 2019-01-25 NOTE — Telephone Encounter (Signed)
OK, per patient preference

## 2019-01-27 NOTE — Telephone Encounter (Signed)
Patient is notified of this transfer. Patient is requesting for November or December appointment. Will call patient when this schedule is available.

## 2019-02-05 ENCOUNTER — Other Ambulatory Visit: Payer: Self-pay

## 2019-02-09 ENCOUNTER — Telehealth: Payer: Self-pay

## 2019-02-09 DIAGNOSIS — Z139 Encounter for screening, unspecified: Secondary | ICD-10-CM

## 2019-02-09 DIAGNOSIS — Z1211 Encounter for screening for malignant neoplasm of colon: Secondary | ICD-10-CM

## 2019-02-09 NOTE — Telephone Encounter (Signed)
He should come in for an appt - not just for labs/urine.  He should have other blood work like his cholesterol, liver tests for routine f/u as well.   Serotonin is not typically done - is he concerned about depression?  Why does he want the test done? This test will likely not be covered by insurance and I am unsure how much it costs.

## 2019-02-09 NOTE — Telephone Encounter (Signed)
Copied from [place] [id number]. Topic: Appointment Scheduling - Scheduling Inquiry for Clinic >> Feb 09, 2019  2:40 PM Samuel Flowers wrote: Reason for CRM: Pt called requesting to be tested for platelets/serotonin. Also reported that he has experienced blood in his urine for 2-3 days but does not want to come in until lab order is placed.  Best contact: [phone]

## 2019-02-10 ENCOUNTER — Telehealth: Payer: Self-pay

## 2019-02-10 NOTE — Telephone Encounter (Signed)
Copied from [place] [id number]. Topic: General - Other >> Feb 10, 2019 11:31 AM Keene Breath wrote: Reason for CRM: Patient would like for Dr. Quay Burow assistant to call him so he can give her some updates.  Please call to discuss at [phone]

## 2019-02-10 NOTE — Telephone Encounter (Signed)
Spoke with pt. He went to urology and was diagnosed with UTI. He is currently on antibiotics and states he does not feel like he needs to be seen he just wants blood work to test his serotonin level because he wants to see if his son got elevate seratonin level from him.

## 2019-02-10 NOTE — Telephone Encounter (Signed)
Patient is at urologists and will cal back to schedule.

## 2019-02-10 NOTE — Telephone Encounter (Signed)
I have ordered the serotonin level.  He will need to pay for that and I do not know how much of this because it is not covered by insurance.

## 2019-02-10 NOTE — Telephone Encounter (Signed)
Response documented in previous telephone note.

## 2019-02-10 NOTE — Telephone Encounter (Signed)
Will you have pt make an appointment with Dr. Quay Burow in regards please.

## 2019-02-11 NOTE — Telephone Encounter (Signed)
LVM informing patient of md message.

## 2019-02-12 ENCOUNTER — Encounter: Payer: Self-pay | Admitting: Gastroenterology

## 2019-02-12 ENCOUNTER — Other Ambulatory Visit: Payer: Self-pay

## 2019-02-12 DIAGNOSIS — Z20822 Contact with and (suspected) exposure to covid-19: Secondary | ICD-10-CM

## 2019-02-13 LAB — NOVEL CORONAVIRUS, NAA: SARS-CoV-2, NAA: NOT DETECTED

## 2019-02-15 ENCOUNTER — Other Ambulatory Visit (INDEPENDENT_AMBULATORY_CARE_PROVIDER_SITE_OTHER): Payer: Medicare Other

## 2019-02-15 DIAGNOSIS — E785 Hyperlipidemia, unspecified: Secondary | ICD-10-CM | POA: Diagnosis not present

## 2019-02-15 DIAGNOSIS — Z139 Encounter for screening, unspecified: Secondary | ICD-10-CM

## 2019-02-15 DIAGNOSIS — I2510 Atherosclerotic heart disease of native coronary artery without angina pectoris: Secondary | ICD-10-CM | POA: Diagnosis not present

## 2019-02-15 LAB — CBC WITH DIFFERENTIAL/PLATELET
Basophils Absolute: 0 10*3/uL (ref 0.0–0.1)
Basophils Relative: 0.8 % (ref 0.0–3.0)
Eosinophils Absolute: 0.2 10*3/uL (ref 0.0–0.7)
Eosinophils Relative: 3.8 % (ref 0.0–5.0)
HCT: 42.7 % (ref 39.0–52.0)
Hemoglobin: 14.2 g/dL (ref 13.0–17.0)
Lymphocytes Relative: 18 % (ref 12.0–46.0)
Lymphs Abs: 1.2 10*3/uL (ref 0.7–4.0)
MCHC: 33.3 g/dL (ref 30.0–36.0)
MCV: 95.3 fl (ref 78.0–100.0)
Monocytes Absolute: 0.8 10*3/uL (ref 0.1–1.0)
Monocytes Relative: 12.9 % — ABNORMAL HIGH (ref 3.0–12.0)
Neutro Abs: 4.2 10*3/uL (ref 1.4–7.7)
Neutrophils Relative %: 64.5 % (ref 43.0–77.0)
Platelets: 268 10*3/uL (ref 150.0–400.0)
RBC: 4.48 Mil/uL (ref 4.22–5.81)
RDW: 13.1 % (ref 11.5–15.5)
WBC: 6.5 10*3/uL (ref 4.0–10.5)

## 2019-02-15 LAB — LIPID PANEL
Cholesterol: 98 mg/dL (ref 0–200)
HDL: 38.8 mg/dL — ABNORMAL LOW (ref >39.00)
LDL Cholesterol: 46 mg/dL (ref 0–99)
NonHDL: 58.89
Total CHOL/HDL Ratio: 3
Triglycerides: 63 mg/dL (ref 0.0–149.0)
VLDL: 12.6 mg/dL (ref 0.0–40.0)

## 2019-02-15 LAB — COMPREHENSIVE METABOLIC PANEL
ALT: 17 U/L (ref 0–53)
AST: 18 U/L (ref 0–37)
Albumin: 4.2 g/dL (ref 3.5–5.2)
Alkaline Phosphatase: 53 U/L (ref 39–117)
BUN: 18 mg/dL (ref 6–23)
CO2: 28 mEq/L (ref 19–32)
Calcium: 9.9 mg/dL (ref 8.4–10.5)
Chloride: 105 mEq/L (ref 96–112)
Creatinine, Ser: 1.39 mg/dL (ref 0.40–1.50)
GFR: 50.89 mL/min — ABNORMAL LOW (ref >60.00)
Glucose, Bld: 96 mg/dL (ref 70–99)
Potassium: 4.7 mEq/L (ref 3.5–5.1)
Sodium: 140 mEq/L (ref 135–145)
Total Bilirubin: 0.4 mg/dL (ref 0.2–1.2)
Total Protein: 7.4 g/dL (ref 6.0–8.3)

## 2019-02-15 LAB — TSH: TSH: 5.21 u[IU]/mL — ABNORMAL HIGH (ref 0.35–4.50)

## 2019-02-15 LAB — HEMOGLOBIN A1C: Hgb A1c MFr Bld: 6.1 % (ref 4.6–6.5)

## 2019-02-18 LAB — SEROTONIN SERUM: Serotonin, Serum: 35 ng/mL — ABNORMAL LOW (ref 56–244)

## 2019-02-21 ENCOUNTER — Encounter: Payer: Self-pay | Admitting: Internal Medicine

## 2019-02-21 DIAGNOSIS — R7303 Prediabetes: Secondary | ICD-10-CM | POA: Insufficient documentation

## 2019-03-05 ENCOUNTER — Encounter: Payer: Self-pay | Admitting: Internal Medicine

## 2019-03-24 ENCOUNTER — Telehealth: Payer: Self-pay | Admitting: *Deleted

## 2019-03-24 NOTE — Telephone Encounter (Signed)
Called patient, no answer, left message for the patient to call us back today before 5 pm to reschedule the PV or the colon would be cancelled. I called the home# also, no answer, no machine.

## 2019-04-10 ENCOUNTER — Other Ambulatory Visit: Payer: Self-pay | Admitting: Cardiology

## 2019-04-14 ENCOUNTER — Encounter: Payer: Medicare Other | Admitting: Gastroenterology

## 2019-04-21 ENCOUNTER — Encounter: Payer: Self-pay | Admitting: Gastroenterology

## 2019-04-21 ENCOUNTER — Ambulatory Visit: Payer: Medicare Other

## 2019-04-21 ENCOUNTER — Other Ambulatory Visit: Payer: Self-pay

## 2019-04-21 VITALS — Temp 97.2°F | Ht 68.0 in | Wt 171.2 lb

## 2019-04-21 DIAGNOSIS — Z1211 Encounter for screening for malignant neoplasm of colon: Secondary | ICD-10-CM

## 2019-04-21 DIAGNOSIS — Z1159 Encounter for screening for other viral diseases: Secondary | ICD-10-CM

## 2019-04-21 MED ORDER — NA SULFATE-K SULFATE-MG SULF 17.5-3.13-1.6 GM/177ML PO SOLN: 1.0000 | Freq: Once | ORAL | 0 refills | Status: AC

## 2019-04-21 NOTE — Progress Notes (Signed)

## 2019-04-26 ENCOUNTER — Ambulatory Visit (INDEPENDENT_AMBULATORY_CARE_PROVIDER_SITE_OTHER): Payer: Medicare Other

## 2019-04-26 DIAGNOSIS — Z1159 Encounter for screening for other viral diseases: Secondary | ICD-10-CM

## 2019-04-27 LAB — SARS CORONAVIRUS 2 (TAT 6-24 HRS): SARS Coronavirus 2: NEGATIVE

## 2019-04-29 ENCOUNTER — Encounter: Payer: Self-pay | Admitting: Gastroenterology

## 2019-04-29 ENCOUNTER — Ambulatory Visit (AMBULATORY_SURGERY_CENTER): Payer: Medicare Other | Admitting: Gastroenterology

## 2019-04-29 ENCOUNTER — Other Ambulatory Visit: Payer: Self-pay

## 2019-04-29 VITALS — BP 118/57 | HR 41 | Temp 97.5°F | Resp 19 | Ht 68.0 in | Wt 171.2 lb

## 2019-04-29 DIAGNOSIS — D122 Benign neoplasm of ascending colon: Secondary | ICD-10-CM | POA: Diagnosis not present

## 2019-04-29 DIAGNOSIS — D120 Benign neoplasm of cecum: Secondary | ICD-10-CM

## 2019-04-29 DIAGNOSIS — Z1211 Encounter for screening for malignant neoplasm of colon: Secondary | ICD-10-CM

## 2019-04-29 MED ORDER — SODIUM CHLORIDE 0.9 % IV SOLN: 500.0000 mL | Freq: Once | INTRAVENOUS | Status: DC

## 2019-04-29 NOTE — Op Note (Signed)
[place] Patient Name: Samuel Flowers Procedure Date: 04/29/2019 8:33 AM MRN: [id number] Endoscopist: Mauri Pole , MD Age: 68 Referring MD:  Date of Birth: 05/17/1950 Gender: Male Account #: [id number] Procedure:                Colonoscopy Indications:              Screening for colorectal malignant neoplasm Medicines:                Monitored Anesthesia Care Procedure:                Pre-Anesthesia Assessment:                           - Prior to the procedure, a History and Physical                            was performed, and patient medications and                            allergies were reviewed. The patient's tolerance of                            previous anesthesia was also reviewed. The risks                            and benefits of the procedure and the sedation                            options and risks were discussed with the patient.                            All questions were answered, and informed consent                            was obtained. Prior Anticoagulants: The patient has                            taken no previous anticoagulant or antiplatelet                            agents. ASA Grade Assessment: II - A patient with                            mild systemic disease. After reviewing the risks                            and benefits, the patient was deemed in                            satisfactory condition to undergo the procedure.                           After obtaining informed consent, the colonoscope  was passed under direct vision. Throughout the                            procedure, the patient's blood pressure, pulse, and                            oxygen saturations were monitored continuously. The                            Colonoscope was introduced through the anus and                            advanced to the the cecum, identified by                            appendiceal orifice and  ileocecal valve. The                            colonoscopy was performed without difficulty. The                            patient tolerated the procedure well. The quality                            of the bowel preparation was excellent. The                            ileocecal valve, appendiceal orifice, and rectum                            were photographed. Scope In: 8:39:16 AM Scope Out: 8:57:15 AM Scope Withdrawal Time: 0 hours 13 minutes 36 seconds  Total Procedure Duration: 0 hours 17 minutes 59 seconds  Findings:                 The perianal and digital rectal examinations were                            normal.                           A less than 1 mm polyp was found in the cecum. The                            polyp was sessile. The polyp was removed with a                            cold biopsy forceps. Resection and retrieval were                            complete.                           A 5 mm polyp was found in the ascending colon. The  polyp was sessile. The polyp was removed with a                            cold snare. Resection and retrieval were complete.                           Non-bleeding internal hemorrhoids were found during                            retroflexion. The hemorrhoids were medium-sized.                           The exam was otherwise without abnormality. Complications:            No immediate complications. Estimated Blood Loss:     Estimated blood loss was minimal. Impression:               - One less than 1 mm polyp in the cecum, removed                            with a cold biopsy forceps. Resected and retrieved.                           - One 5 mm polyp in the ascending colon, removed                            with a cold snare. Resected and retrieved.                           - Non-bleeding internal hemorrhoids.                           - The examination was otherwise normal. Recommendation:            - Patient has a contact number available for                            emergencies. The signs and symptoms of potential                            delayed complications were discussed with the                            patient. Return to normal activities tomorrow.                            Written discharge instructions were provided to the                            patient.                           - Resume previous diet.                           - Continue present  medications.                           - Await pathology results.                           - Repeat colonoscopy in 5-10 years for surveillance                            based on pathology results. Mauri Pole, MD 04/29/2019 9:01:41 AM This report has been signed electronically.

## 2019-04-29 NOTE — Progress Notes (Signed)
Pt's states no medical or surgical changes since previsit or office visit.  Temp taken by JB VS taken by CW 

## 2019-04-29 NOTE — Patient Instructions (Signed)
YOU HAD AN ENDOSCOPIC PROCEDURE TODAY AT [facility]:   Refer to the procedure report that was given to you for any specific questions about what was found during the examination.  If the procedure report does not answer your questions, please call your gastroenterologist to clarify.  If you requested that your care partner not be given the details of your procedure findings, then the procedure report has been included in a sealed envelope for you to review at your convenience later.  YOU SHOULD EXPECT: Some feelings of bloating in the abdomen. Passage of more gas than usual.  Walking can help get rid of the air that was put into your GI tract during the procedure and reduce the bloating. If you had a lower endoscopy (such as a colonoscopy or flexible sigmoidoscopy) you may notice spotting of blood in your stool or on the toilet paper. If you underwent a bowel prep for your procedure, you may not have a normal bowel movement for a few days.  Please Note:  You might notice some irritation and congestion in your nose or some drainage.  This is from the oxygen used during your procedure.  There is no need for concern and it should clear up in a day or so.  SYMPTOMS TO REPORT IMMEDIATELY:   Following lower endoscopy (colonoscopy or flexible sigmoidoscopy):  Excessive amounts of blood in the stool  Significant tenderness or worsening of abdominal pains  Swelling of the abdomen that is new, acute  Fever of 100F or higher   For urgent or emergent issues, a gastroenterologist can be reached at any hour by calling [phone].   DIET:  We do recommend a small meal at first, but then you may proceed to your regular diet.  Drink plenty of fluids but you should avoid alcoholic beverages for 24 hours.  ACTIVITY:  You should plan to take it easy for the rest of today and you should NOT DRIVE or use heavy machinery until tomorrow (because of the sedation medicines used during the test).     FOLLOW UP: Our staff will call the number listed on your records 48-72 hours following your procedure to check on you and address any questions or concerns that you may have regarding the information given to you following your procedure. If we do not reach you, we will leave a message.  We will attempt to reach you two times.  During this call, we will ask if you have developed any symptoms of COVID 19. If you develop any symptoms (ie: fever, flu-like symptoms, shortness of breath, cough etc.) before then, please call [phone].  If you test positive for Covid 19 in the 2 weeks post procedure, please call and report this information to [place].    If any biopsies were taken you will be contacted by phone or by letter within the next 1-3 weeks.  Please call us at [phone] if you have not heard about the biopsies in 3 weeks.    SIGNATURES/CONFIDENTIALITY: You and/or your care partner have signed paperwork which will be entered into your electronic medical record.  These signatures attest to the fact that that the information above on your After Visit Summary has been reviewed and is understood.  Full responsibility of the confidentiality of this discharge information lies with you and/or your care-partner.   Resume medications. Information given on polyps and hemorrhoids.

## 2019-04-29 NOTE — Progress Notes (Signed)
Called to room to assist during endoscopic procedure.  Patient ID and intended procedure confirmed with present staff. Received instructions for my participation in the procedure from the performing physician.  

## 2019-04-29 NOTE — Progress Notes (Signed)
A and O x3. Report to RN. Tolerated MAC anesthesia well.

## 2019-05-03 ENCOUNTER — Telehealth: Payer: Self-pay

## 2019-05-03 NOTE — Telephone Encounter (Deleted)
Called [phone] and left a message we tried to reach pt for a follow up call. maw Follow up Call-  Call back number 04/29/2019  Post procedure Call Back phone  # [phone]  Permission to leave phone message Yes  Some recent data might be hidden     Patient questions:  Do you have a fever, pain , or abdominal swelling? {yes no:314532} Pain Score  {NUMBERS; 0-10:5044} *  Have you tolerated food without any problems? {yes no:314532}  Have you been able to return to your normal activities? {yes no:314532}  Do you have any questions about your discharge instructions: Diet   {yes no:314532} Medications  {yes no:314532} Follow up visit  {yes no:314532}  Do you have questions or concerns about your Care? {yes no:314532}  Actions: * If pain score is 4 or above: {ACTION; [facility] ENDO PAIN >4:21563::"No action needed, pain <4."}

## 2019-05-03 NOTE — Telephone Encounter (Signed)
LVM

## 2019-05-03 NOTE — Telephone Encounter (Signed)
Called [phone] and left a message we tried to reach pt for a follow up call. maw

## 2019-05-05 ENCOUNTER — Encounter: Payer: Self-pay | Admitting: Gastroenterology

## 2019-05-28 ENCOUNTER — Telehealth: Payer: Self-pay | Admitting: Internal Medicine

## 2019-05-28 NOTE — Telephone Encounter (Signed)
Patient would like to know if he should take something to help keep his sugars down so he does not become diabetic. He also wants to know what medications you would suggest so he can research them.

## 2019-05-28 NOTE — Telephone Encounter (Signed)
In the last A1c was 6.1%-the prediabetic range is 5.7-6.4% and 6.5% is considered diabetes.  So, he is in the middle of the right knee.  He can review lifestyle only we can monitor every 6 months.  (Regular exercise, low sugar/carbohydrate diet and keeping the weight down)  The other option if he can take Low-dose of Metformin, which is first medication we use for type 2 diabetes and for prediabetes to prevent diabetes.  The short acting form is typically given twice daily with food, but he may be able to take it once daily since he is only a prediabetic.    This medication is safe.  There is an extended release Metformin that has been recalled, but this 1 I am talking about is not that Metformin.  It does not cause the sugars to go too low, which is important.  Most common side effect are loose stools and if we stop the medication that goes away.

## 2019-05-28 NOTE — Telephone Encounter (Signed)
Copied from [place] [id number]. Topic: General - Inquiry >> May 28, 2019 11:43 AM Samuel Flowers, NT wrote: Reason for CRM: Pt called in stating he is curious about what he needs to be doing in regards to being prediabetic according to last blood work. Please advise.

## 2019-05-31 MED ORDER — METFORMIN HCL 500 MG PO TABS: 500.0000 mg | ORAL_TABLET | Freq: Every day | ORAL | 1 refills | Status: DC

## 2019-05-31 NOTE — Telephone Encounter (Signed)
Prescription sent

## 2019-05-31 NOTE — Addendum Note (Signed)
Addended by: Binnie Rail on: 05/31/2019 03:55 PM   Modules accepted: Orders

## 2019-05-31 NOTE — Telephone Encounter (Signed)
Would like a low dose Metformin sent to [facility] on [address].

## 2019-06-05 ENCOUNTER — Encounter: Payer: Self-pay | Admitting: Internal Medicine

## 2019-06-09 ENCOUNTER — Encounter: Payer: Self-pay | Admitting: Internal Medicine

## 2019-06-09 ENCOUNTER — Ambulatory Visit: Payer: Medicare Other

## 2019-06-17 ENCOUNTER — Ambulatory Visit: Payer: Medicare PPO | Attending: Internal Medicine

## 2019-06-17 DIAGNOSIS — Z23 Encounter for immunization: Secondary | ICD-10-CM | POA: Insufficient documentation

## 2019-06-17 NOTE — Progress Notes (Signed)
   Covid-19 Vaccination Clinic  Name:  Samuel Flowers    MRN: [id number] DOB: 1951-04-05  06/17/2019  [name] was observed post Covid-19 immunization for 15 minutes without incidence. He was provided with Vaccine Information Sheet and instruction to access the V-Safe system.   [name] was instructed to call 911 with any severe reactions post vaccine: Marland Kitchen Difficulty breathing  . Swelling of your face and throat  . A fast heartbeat  . A bad rash all over your body  . Dizziness and weakness    Immunizations Administered    Name Date Dose VIS Date Route   Pfizer COVID-19 Vaccine 06/17/2019  8:12 AM 0.3 mL 04/23/2019 Intramuscular   Manufacturer: [place]   Lot: [id number]   [place]: [id number]

## 2019-06-24 ENCOUNTER — Other Ambulatory Visit: Payer: Self-pay

## 2019-06-24 ENCOUNTER — Encounter: Payer: Self-pay | Admitting: Podiatry

## 2019-06-24 ENCOUNTER — Ambulatory Visit (INDEPENDENT_AMBULATORY_CARE_PROVIDER_SITE_OTHER): Payer: Medicare PPO

## 2019-06-24 ENCOUNTER — Ambulatory Visit: Payer: Medicare PPO | Admitting: Podiatry

## 2019-06-24 ENCOUNTER — Other Ambulatory Visit: Payer: Self-pay | Admitting: Podiatry

## 2019-06-24 VITALS — BP 126/65 | HR 59 | Temp 97.6°F | Resp 16

## 2019-06-24 DIAGNOSIS — M779 Enthesopathy, unspecified: Secondary | ICD-10-CM

## 2019-06-24 DIAGNOSIS — Q828 Other specified congenital malformations of skin: Secondary | ICD-10-CM

## 2019-06-24 DIAGNOSIS — M79672 Pain in left foot: Secondary | ICD-10-CM

## 2019-06-24 DIAGNOSIS — M79671 Pain in right foot: Secondary | ICD-10-CM

## 2019-06-24 NOTE — Patient Instructions (Signed)

## 2019-06-24 NOTE — Progress Notes (Signed)
Subjective:   Patient ID: Samuel Flowers, male   DOB: 69 y.o.   MRN: [id number]   HPI Patient presents stating that the outsides of both feet are bothersome with the left being worse.  States he develops lesion formation bilateral which are tender inflammation and he walks 6 to 8 miles per day and also has orthotics that he needs replaced as they are over 50 years old.   Review of Systems  All other systems reviewed and are negative.       Objective:  Physical Exam Vitals and nursing note reviewed.  Constitutional:      Appearance: He is well-developed.  Pulmonary:     Effort: Pulmonary effort is normal.  Musculoskeletal:        General: Normal range of motion.  Skin:    General: Skin is warm.  Neurological:     Mental Status: He is alert.     Neurovascular status found to be intact muscle strength was found to be adequate range of motion within normal limits.  Patient is noted to have inflammation pain of the base of the fifth metatarsal left over right with fluid buildup and discomfort with palpation.  There is keratotic lesion at the insertion base of fifth metatarsal bilateral with nucleated cores that are painful.  Patient has history of plantar fasciitis     Assessment:  Peroneal tendinitis bilateral with inflammation at the insertion left over right with keratotic lesion and also moderate plantar fasciitis long-term history with very active gentleman     Plan:  H&P reviewed conditions of both feet and today I did careful prep and injected the left insertion 3 mg Dexasone Kenalog 5 mg Xylocaine debrided let lesions left and right and advised orthotics and casted for functional orthotic devices.  Patient will be seen back as needed and understands this most likely will recur again at 1 point in future  X-rays indicate that there is some irritation around the bone fifth metatarsal base left

## 2019-06-24 NOTE — Progress Notes (Signed)
   Subjective:    Patient ID: Samuel Flowers, male    DOB: February 01, 1951, 69 y.o.   MRN: [id number]  HPI    Review of Systems  All other systems reviewed and are negative.      Objective:   Physical Exam        Assessment & Plan:

## 2019-07-12 ENCOUNTER — Ambulatory Visit: Payer: Medicare PPO | Attending: Internal Medicine

## 2019-07-12 DIAGNOSIS — Z23 Encounter for immunization: Secondary | ICD-10-CM | POA: Insufficient documentation

## 2019-07-12 NOTE — Progress Notes (Signed)
   Covid-19 Vaccination Clinic  Name:  Clements Ohayon    MRN: [id number] DOB: 06/03/1950  07/12/2019  Mr. Opalinski was observed post Covid-19 immunization for 15 minutes without incidence. He was provided with Vaccine Information Sheet and instruction to access the V-Safe system.   Mr. Grayer was instructed to call 911 with any severe reactions post vaccine: Marland Kitchen Difficulty breathing  . Swelling of your face and throat  . A fast heartbeat  . A bad rash all over your body  . Dizziness and weakness    Immunizations Administered    Name Date Dose VIS Date Route   Pfizer COVID-19 Vaccine 07/12/2019 12:38 PM 0.3 mL 04/23/2019 Intramuscular   Manufacturer: [place]   Lot: [id number]   [place]: [id number]

## 2019-07-28 ENCOUNTER — Encounter: Payer: Medicare PPO | Admitting: Orthotics

## 2019-07-28 ENCOUNTER — Other Ambulatory Visit: Payer: Self-pay

## 2019-09-13 ENCOUNTER — Ambulatory Visit: Payer: Medicare PPO | Admitting: Cardiology

## 2019-10-04 NOTE — Progress Notes (Signed)
Virtual Visit via Video Note   This visit type was conducted due to national recommendations for restrictions regarding the COVID-19 Pandemic (e.g. social distancing) in an effort to limit this patient's exposure and mitigate transmission in our community.  Due to his co-morbid illnesses, this patient is at least at moderate risk for complications without adequate follow up.  This format is felt to be most appropriate for this patient at this time.  All issues noted in this document were discussed and addressed.  A limited physical exam was performed with this format.  Please refer to the patient's chart for his consent to telehealth for [facility].   Date:  10/08/2019   ID:  [name], DOB 13-Jan-1951, MRN [id number]  Patient Location:Home Provider Location: Home  PCP:  Binnie Rail, MD  Cardiologist:  Dr Stanford Breed  Evaluation Performed:  Follow-Up Visit  Chief Complaint:  FU CAD  History of Present Illness:    FU coronary disease. Patient had coronary artery bypass and graft in 2002. His last cardiac catheterization in 2003 revealed patent grafts but there was a stenosis in a posterior lateral branch that was not grafted. Nuclear study November 2017 showed ejection fraction 62% and probable diaphragmatic attenuation; mild inferior ischemia could not be excluded.  Since he was last seen, patient states he occasionally has an unusual sensation in his chest with walking that goes away with continued walking.  He denies dyspnea or syncope.  The patient does not have symptoms concerning for COVID-19 infection (fever, chills, cough, or new shortness of breath).    Past Medical History:  Diagnosis Date  . Acute asthmatic bronchitis   . Allergy    pollen  . BPH (benign prostatic hypertrophy)    mild  . C. difficile colitis   . CAD (coronary artery disease)   . Colitis   . Difficulty urinating 2017   self cath for all urination  . Fatty liver   . Hyperlipemia   .  Hypothyroidism   . IBS (irritable bowel syndrome)   . Nonspecific colitis   . Rhinitis, allergic    Past Surgical History:  Procedure Laterality Date  . CATARACT EXTRACTION, BILATERAL Bilateral 2016  . COLONOSCOPY  2010  . CORONARY ARTERY BYPASS GRAFT  2002  . EYE SURGERY    . LUMBAR LAMINECTOMY  1990     Current Meds  Medication Sig  . aspirin 81 MG EC tablet Take 81 mg by mouth daily.   . Coenzyme Q10 (COQ-10) 50 MG CAPS Take 1 tablet by mouth daily.  . diphenhydrAMINE (BENADRYL ALLERGY) 25 MG tablet Take 25 mg by mouth every 6 (six) hours as needed.  . fenofibrate (TRICOR) 145 MG tablet TAKE 1 TABLET BY MOUTH  DAILY  . ibuprofen (ADVIL) 200 MG tablet Take 200 mg by mouth every 6 (six) hours as needed.  . loratadine (CLARITIN) 10 MG tablet Take 10 mg by mouth daily.  . meloxicam (MOBIC) 15 MG tablet Take 1 tablet (15 mg total) by mouth daily.  . metFORMIN (GLUCOPHAGE) 500 MG tablet Take 1 tablet by mouth daily with breakfast. Need office visit for more refills.  . rosuvastatin (CRESTOR) 10 MG tablet TAKE 1 TABLET BY MOUTH  DAILY     Allergies:   Patient has no known allergies.   Social History   Tobacco Use  . Smoking status: Never Smoker  . Smokeless tobacco: Never Used  Substance Use Topics  . Alcohol use: Yes    Alcohol/week: 3.0 standard  drinks    Types: 3 Standard drinks or equivalent per week    Comment: occasionally  . Drug use: No     Family Hx: The patient's family history includes Colon cancer in his paternal aunt; Colon polyps in his father; Coronary artery disease in his father and mother; Diabetes in his father; Heart disease in his father; Hyperlipidemia in his father; Mental illness in his mother. There is no history of Irritable bowel syndrome, Stroke, Alcohol abuse, Cancer, Depression, Early death, Hypertension, Esophageal cancer, Rectal cancer, or Stomach cancer.  ROS:   Please see the history of present illness.    No Fever, chills  or productive  cough All other systems reviewed and are negative.   Recent Labs: 02/15/2019: ALT 17; BUN 18; Creatinine, Ser 1.39; Hemoglobin 14.2; Platelets 268.0; Potassium 4.7; Sodium 140; TSH 5.21   Recent Lipid Panel Lab Results  Component Value Date/Time   CHOL 98 02/15/2019 08:12 AM   TRIG 63.0 02/15/2019 08:12 AM   HDL 38.80 (L) 02/15/2019 08:12 AM   CHOLHDL 3 02/15/2019 08:12 AM   LDLCALC 46 02/15/2019 08:12 AM   LDLDIRECT 143.9 03/09/2013 09:13 AM    Wt Readings from Last 3 Encounters:  10/08/19 171 lb (77.6 kg)  04/29/19 171 lb 3.2 oz (77.7 kg)  04/21/19 171 lb 3.2 oz (77.7 kg)     Objective:    Vital Signs:  Ht 5\' 8"  (1.727 m)   Wt 171 lb (77.6 kg)   BMI 26.00 kg/m    VITAL SIGNS:  reviewed NAD Answers questions appropriately Normal affect Remainder of physical examination not performed (telehealth visit; coronavirus pandemic)  ASSESSMENT & PLAN:    1. Coronary artery disease-Plan to continue medical therapy with aspirin and statin.  He is having occasional unusual feelings in his chest and his grafts are approximately 66 to 68 years old.  We will plan a Lexiscan nuclear study for risk stratification. 2. Hyperlipidemia-plan to continue statin.  Check lipids and liver.  We will also check hemoglobin A1c and potassium/renal function.  COVID-19 Education: The importance of social distancing was discussed today.  Time:   Today, I have spent 16 minutes with the patient with telehealth technology discussing the above problems.     Medication Adjustments/Labs and Tests Ordered: Current medicines are reviewed at length with the patient today.  Concerns regarding medicines are outlined above.   Tests Ordered: No orders of the defined types were placed in this encounter.   Medication Changes: No orders of the defined types were placed in this encounter.   Follow Up:  Either In Person or Virtual in 1 year(s)  Signed, [name], MD  10/08/2019 8:41 AM    [place]

## 2019-10-06 ENCOUNTER — Other Ambulatory Visit: Payer: Self-pay | Admitting: Internal Medicine

## 2019-10-08 ENCOUNTER — Other Ambulatory Visit: Payer: Self-pay | Admitting: Internal Medicine

## 2019-10-08 ENCOUNTER — Telehealth (INDEPENDENT_AMBULATORY_CARE_PROVIDER_SITE_OTHER): Payer: Medicare PPO | Admitting: Cardiology

## 2019-10-08 ENCOUNTER — Encounter: Payer: Self-pay | Admitting: *Deleted

## 2019-10-08 ENCOUNTER — Encounter: Payer: Self-pay | Admitting: Cardiology

## 2019-10-08 VITALS — Ht 68.0 in | Wt 171.0 lb

## 2019-10-08 DIAGNOSIS — I2510 Atherosclerotic heart disease of native coronary artery without angina pectoris: Secondary | ICD-10-CM

## 2019-10-08 DIAGNOSIS — R7303 Prediabetes: Secondary | ICD-10-CM

## 2019-10-08 DIAGNOSIS — E785 Hyperlipidemia, unspecified: Secondary | ICD-10-CM | POA: Diagnosis not present

## 2019-10-08 NOTE — Patient Instructions (Signed)
Medication Instructions:  NO CHANGE *If you need a refill on your cardiac medications before your next appointment, please call your pharmacy*   Lab Work:  Your physician recommends that you RETURN FOR LAB WORK PRIOR TO EATING  If you have labs (blood work) drawn today and your tests are completely normal, you will receive your results only by: Marland Kitchen MyChart Message (if you have MyChart) OR . A paper copy in the mail If you have any lab test that is abnormal or we need to change your treatment, we will call you to review the results.   Testing/Procedures:  Your physician has requested that you have a lexiscan myoview. For further information please visit [email or web address]. Please follow instruction sheet, as given.[email or web address]     Follow-Up: At [facility], you and your health needs are our priority.  As part of our continuing mission to provide you with exceptional heart care, we have created designated Provider Care Teams.  These Care Teams include your primary Cardiologist (physician) and Advanced Practice Providers (APPs -  Physician Assistants and Nurse Practitioners) who all work together to provide you with the care you need, when you need it.  We recommend signing up for the patient portal called "MyChart".  Sign up information is provided on this After Visit Summary.  MyChart is used to connect with patients for Virtual Visits (Telemedicine).  Patients are able to view lab/test results, encounter notes, upcoming appointments, etc.  Non-urgent messages can be sent to your provider as well.   To learn more about what you can do with MyChart, go to [email or web address].    Your next appointment:   12 month(s)  The format for your next appointment:   Either In Person or Virtual  Provider:   You may see [name] MD or one of the following Advanced Practice Providers on your designated Care Team:    Kerin Ransom, PA-C  [place], [place]  Coletta Memos, [place]

## 2019-11-08 ENCOUNTER — Telehealth (HOSPITAL_COMMUNITY): Payer: Self-pay

## 2019-11-11 ENCOUNTER — Other Ambulatory Visit: Payer: Self-pay

## 2019-11-11 ENCOUNTER — Ambulatory Visit: Payer: Medicare PPO | Admitting: Internal Medicine

## 2019-11-11 ENCOUNTER — Ambulatory Visit (HOSPITAL_COMMUNITY): Payer: Medicare PPO | Attending: Cardiovascular Disease

## 2019-11-11 ENCOUNTER — Encounter: Payer: Self-pay | Admitting: Internal Medicine

## 2019-11-11 VITALS — BP 126/72 | HR 69 | Temp 98.0°F | Ht 68.0 in | Wt 169.0 lb

## 2019-11-11 DIAGNOSIS — S90861A Insect bite (nonvenomous), right foot, initial encounter: Secondary | ICD-10-CM | POA: Insufficient documentation

## 2019-11-11 DIAGNOSIS — W57XXXA Bitten or stung by nonvenomous insect and other nonvenomous arthropods, initial encounter: Secondary | ICD-10-CM | POA: Diagnosis not present

## 2019-11-11 DIAGNOSIS — R7303 Prediabetes: Secondary | ICD-10-CM

## 2019-11-11 DIAGNOSIS — I2510 Atherosclerotic heart disease of native coronary artery without angina pectoris: Secondary | ICD-10-CM | POA: Insufficient documentation

## 2019-11-11 LAB — HEMOGLOBIN A1C: Hgb A1c MFr Bld: 5.8 % (ref 4.6–6.5)

## 2019-11-11 LAB — COMPREHENSIVE METABOLIC PANEL
ALT: 105 U/L — ABNORMAL HIGH (ref 0–53)
AST: 94 U/L — ABNORMAL HIGH (ref 0–37)
Albumin: 4.2 g/dL (ref 3.5–5.2)
Alkaline Phosphatase: 43 U/L (ref 39–117)
BUN: 15 mg/dL (ref 6–23)
CO2: 30 mEq/L (ref 19–32)
Calcium: 9.4 mg/dL (ref 8.4–10.5)
Chloride: 102 mEq/L (ref 96–112)
Creatinine, Ser: 1.07 mg/dL (ref 0.40–1.50)
GFR: 68.68 mL/min (ref >60.00)
Glucose, Bld: 88 mg/dL (ref 70–99)
Potassium: 4.5 mEq/L (ref 3.5–5.1)
Sodium: 138 mEq/L (ref 135–145)
Total Bilirubin: 0.4 mg/dL (ref 0.2–1.2)
Total Protein: 6.9 g/dL (ref 6.0–8.3)

## 2019-11-11 LAB — MYOCARDIAL PERFUSION IMAGING
LV dias vol: 82 mL (ref 62–150)
LV sys vol: 28 mL
Peak HR: 72 {beats}/min
Rest HR: 49 {beats}/min
SDS: 5
SRS: 1
SSS: 6
TID: 1.03

## 2019-11-11 LAB — LIPID PANEL
Cholesterol: 111 mg/dL (ref 0–200)
HDL: 47.3 mg/dL (ref >39.00)
LDL Cholesterol: 47 mg/dL (ref 0–99)
NonHDL: 63.43
Total CHOL/HDL Ratio: 2
Triglycerides: 84 mg/dL (ref 0.0–149.0)
VLDL: 16.8 mg/dL (ref 0.0–40.0)

## 2019-11-11 MED ORDER — TECHNETIUM TC 99M TETROFOSMIN IV KIT
30.7000 | PACK | Freq: Once | INTRAVENOUS | Status: AC | PRN
  Administered 2019-11-11: 30.7 via INTRAVENOUS
  Filled 2019-11-11: qty 31

## 2019-11-11 MED ORDER — DOXYCYCLINE HYCLATE 100 MG PO TABS: 100.0000 mg | ORAL_TABLET | Freq: Two times a day (BID) | ORAL | 0 refills | Status: DC

## 2019-11-11 MED ORDER — REGADENOSON 0.4 MG/5ML IV SOLN
0.4000 mg | Freq: Once | INTRAVENOUS | Status: AC
  Administered 2019-11-11: 0.4 mg via INTRAVENOUS

## 2019-11-11 MED ORDER — TECHNETIUM TC 99M TETROFOSMIN IV KIT
10.2000 | PACK | Freq: Once | INTRAVENOUS | Status: AC | PRN
  Administered 2019-11-11: 10.2 via INTRAVENOUS
  Filled 2019-11-11: qty 11

## 2019-11-11 NOTE — Progress Notes (Signed)
Subjective:    Patient ID: Samuel Flowers, male    DOB: 22-May-1950, 69 y.o.   MRN: [id number]  HPI The patient is here for an acute visit.   Tick bite - noticed it yesterday afternoon - pulled tick off.  ? How long on it was on, but it is big.  He brought it with him.  The area of the tick bite is a little tender.  He walks the trails with his dog daily.  He has been more fatigued for a couple of days - not sure why.  He is also achy, but that is not completely new.  He has a rash on his left leg that has been there a while.  It itches and it getting bigger.      Medications and allergies reviewed with patient and updated if appropriate.  Patient Active Problem List   Diagnosis Date Noted  . Prediabetes 02/21/2019  . Paronychia of great toe, left 09/07/2018  . Gastrocnemius tear, left, sequela 07/29/2018  . Onychomycosis 06/16/2018  . Pain in left lower leg 06/16/2018  . Bladder retention, self caths 06/16/2018  . Pain of left lower extremity 08/07/2017  . Right foot pain 08/07/2017  . Left foot pain 08/07/2017  . Bilateral plantar fasciitis 06/13/2014  . Hyperlipidemia LDL goal <70 05/12/2008  . Coronary atherosclerosis 05/12/2008  . ALLERGIC RHINITIS 05/12/2008  . Irritable bowel syndrome 05/12/2008    Current Outpatient Medications on File Prior to Visit  Medication Sig Dispense Refill  . aspirin 81 MG EC tablet Take 81 mg by mouth daily.     . Coenzyme Q10 (COQ-10) 50 MG CAPS Take 1 tablet by mouth daily.    . diphenhydrAMINE (BENADRYL ALLERGY) 25 MG tablet Take 25 mg by mouth every 6 (six) hours as needed.    . fenofibrate (TRICOR) 145 MG tablet TAKE 1 TABLET BY MOUTH  DAILY 30 tablet 0  . ibuprofen (ADVIL) 200 MG tablet Take 200 mg by mouth every 6 (six) hours as needed.    . loratadine (CLARITIN) 10 MG tablet Take 10 mg by mouth daily.    . meloxicam (MOBIC) 15 MG tablet Take 1 tablet (15 mg total) by mouth daily. 30 tablet 0  . metFORMIN (GLUCOPHAGE) 500 MG tablet  Take 1 tablet by mouth daily with breakfast. Need office visit for more refills. 30 tablet 0  . rosuvastatin (CRESTOR) 10 MG tablet TAKE 1 TABLET BY MOUTH  DAILY 90 tablet 3   No current facility-administered medications on file prior to visit.    Past Medical History:  Diagnosis Date  . Acute asthmatic bronchitis   . Allergy    pollen  . BPH (benign prostatic hypertrophy)    mild  . C. difficile colitis   . CAD (coronary artery disease)   . Colitis   . Difficulty urinating 2017   self cath for all urination  . Fatty liver   . Hyperlipemia   . Hypothyroidism   . IBS (irritable bowel syndrome)   . Nonspecific colitis   . Rhinitis, allergic     Past Surgical History:  Procedure Laterality Date  . CATARACT EXTRACTION, BILATERAL Bilateral 2016  . COLONOSCOPY  2010  . CORONARY ARTERY BYPASS GRAFT  2002  . EYE SURGERY    . LUMBAR LAMINECTOMY  1990    Social History   Socioeconomic History  . Marital status: Married    Spouse name: Not on file  . Number of children: 2  . Years  of education: Not on file  . Highest education level: Not on file  Occupational History  . Occupation: Retired  Tobacco Use  . Smoking status: Never Smoker  . Smokeless tobacco: Never Used  Vaping Use  . Vaping Use: Never used  Substance and Sexual Activity  . Alcohol use: Yes    Alcohol/week: 3.0 standard drinks    Types: 3 Standard drinks or equivalent per week    Comment: occasionally  . Drug use: No  . Sexual activity: Yes  Other Topics Concern  . Not on file  Social History Narrative  . Not on file   Social Determinants of Health   Financial Resource Strain:   . Difficulty of Paying Living Expenses:   Food Insecurity:   . Worried About Charity fundraiser in the Last Year:   . Arboriculturist in the Last Year:   Transportation Needs:   . Film/video editor (Medical):   Marland Kitchen Lack of Transportation (Non-Medical):   Physical Activity:   . Days of Exercise per Week:   .  Minutes of Exercise per Session:   Stress:   . Feeling of Stress :   Social Connections:   . Frequency of Communication with Friends and Family:   . Frequency of Social Gatherings with Friends and Family:   . Attends Religious Services:   . Active Member of Clubs or Organizations:   . Attends Archivist Meetings:   Marland Kitchen Marital Status:     Family History  Problem Relation Age of Onset  . Colon polyps Father   . Coronary artery disease Father   . Heart disease Father   . Diabetes Father   . Hyperlipidemia Father   . Coronary artery disease Mother   . Mental illness Mother   . Colon cancer Paternal Aunt   . Irritable bowel syndrome Neg Hx   . Stroke Neg Hx   . Alcohol abuse Neg Hx   . Cancer Neg Hx   . Depression Neg Hx   . Early death Neg Hx   . Hypertension Neg Hx   . Esophageal cancer Neg Hx   . Rectal cancer Neg Hx   . Stomach cancer Neg Hx     Review of Systems  Constitutional: Positive for chills (last night) and fatigue. Negative for fever.  Musculoskeletal:       Increased achy - generalized  Skin: Positive for rash (left upper medial leg) and wound.  Neurological: Negative for headaches.       Objective:   Vitals:   11/11/19 0928  BP: 126/72  Pulse: 69  Temp: 98 F (36.7 C)  SpO2: 99%   BP Readings from Last 3 Encounters:  11/11/19 126/72  06/24/19 126/65  04/29/19 (!) 118/57   Wt Readings from Last 3 Encounters:  11/11/19 169 lb (76.7 kg)  10/08/19 171 lb (77.6 kg)  04/29/19 171 lb 3.2 oz (77.7 kg)   Body mass index is 25.7 kg/m.   Physical Exam Constitutional:      General: He is not in acute distress.    Appearance: Normal appearance. He is not ill-appearing.  Musculoskeletal:        General: No swelling.     Right lower leg: No edema.     Left lower leg: No edema.  Skin:    General: Skin is warm and dry.     Findings: Lesion (tick bite lateral R ankle posterior to malleolous  - tender, indurated, minimal erythema  surrounding, no discharge) and rash (left upper medial leg - darker irregular edges the size of an tangerine) present.  Neurological:     Mental Status: He is alert.            Assessment & Plan:    See Problem List for Assessment and Plan of chronic medical problems.    This visit occurred during the SARS-CoV-2 public health emergency.  Safety protocols were in place, including screening questions prior to the visit, additional usage of staff PPE, and extensive cleaning of exam room while observing appropriate contact time as indicated for disinfecting solutions.

## 2019-11-11 NOTE — Patient Instructions (Signed)
  Blood work was ordered.     Medications reviewed and updated.  Changes include :   Doxycyline 100 mg twice daily for three weeks  Your prescription(s) have been submitted to your pharmacy. Please take as directed and contact our office if you believe you are having problem(s) with the medication(s).

## 2019-11-11 NOTE — Assessment & Plan Note (Signed)
Chronic Following with cardio - to have stress test Lipid, cmp

## 2019-11-11 NOTE — Assessment & Plan Note (Signed)
a1c

## 2019-11-11 NOTE — Assessment & Plan Note (Signed)
Acute Right lateral ankle Tick bite - removed tick and brought it with him No evidence of infection, but concern over tick borne disease Will treat with doxy BID x 21 days Ck AB for tick diseases Monitor for symptoms c/w tick disease

## 2019-11-12 ENCOUNTER — Encounter: Payer: Self-pay | Admitting: Internal Medicine

## 2019-11-12 ENCOUNTER — Other Ambulatory Visit: Payer: Medicare PPO

## 2019-11-12 ENCOUNTER — Telehealth: Payer: Self-pay | Admitting: *Deleted

## 2019-11-12 DIAGNOSIS — E785 Hyperlipidemia, unspecified: Secondary | ICD-10-CM

## 2019-11-12 DIAGNOSIS — W57XXXA Bitten or stung by nonvenomous insect and other nonvenomous arthropods, initial encounter: Secondary | ICD-10-CM

## 2019-11-12 NOTE — Telephone Encounter (Addendum)
-----   Message from Lelon Perla, MD sent at 11/12/2019  7:22 AM EDT ----- LFTs increased; DC fenofibrate and repeat lipids and liver 12 weeks [place] with pt, Aware of dr Jacalyn Lefevre recommendations. Lab orders mailed to the pt

## 2019-11-12 NOTE — Telephone Encounter (Deleted)
-----   Message from Lelon Perla, MD sent at 11/12/2019  7:22 AM EDT ----- LFTs increased; DC fenofibrate and repeat lipids and liver 12 weeks [name]

## 2019-11-12 NOTE — Addendum Note (Signed)
Addended by: Cresenciano Lick on: 11/12/2019 04:36 PM   Modules accepted: Orders

## 2019-11-18 ENCOUNTER — Encounter: Payer: Self-pay | Admitting: Internal Medicine

## 2019-11-18 LAB — HGE(IGG/M)+LYMEAB(IGM)+RKYIGM
HGE IgG Titer: NEGATIVE
HGE IgM Titer: NEGATIVE
LYME DISEASE AB, QUANT, IGM: <0.8 index (ref 0.00–0.79)
RMSF IgM: 0.44 index (ref 0.00–0.89)

## 2019-12-15 ENCOUNTER — Encounter: Payer: Self-pay | Admitting: Internal Medicine

## 2019-12-15 NOTE — Progress Notes (Signed)
Subjective:    Patient ID: Samuel Flowers, male    DOB: [birth year], 69 y.o.   MRN: [id number]  HPI The patient is here for an acute visit for decreased hearing.   He was at the beach and he noticed he was not hearing as well even in both ears.  He does wear hearing aids and at first thought they were not working as well.  His hearing continued to get worse.  He put drops in the ears and his hearing got worse.  He denies ear pain.      He has a rash on his abdomen that is similar to the rash he had on his leg.  That cleared up with an antifungal.  Rash is across his abdomen near his waistline and there is an area up slightly higher.  It does itch.  He thinks it has gotten slightly better.  He did spray on an antifungal and that may have helped.  Medications and allergies reviewed with patient and updated if appropriate.  Patient Active Problem List   Diagnosis Date Noted  . Tick bite 11/11/2019  . Insect bite of foot, right 11/11/2019  . Prediabetes 02/21/2019  . Paronychia of great toe, left 09/07/2018  . Gastrocnemius tear, left, sequela 07/29/2018  . Onychomycosis 06/16/2018  . Pain in left lower leg 06/16/2018  . Bladder retention, self caths 06/16/2018  . Pain of left lower extremity 08/07/2017  . Right foot pain 08/07/2017  . Left foot pain 08/07/2017  . Bilateral plantar fasciitis 06/13/2014  . Hyperlipidemia LDL goal <70 05/12/2008  . Coronary atherosclerosis 05/12/2008  . ALLERGIC RHINITIS 05/12/2008  . Irritable bowel syndrome 05/12/2008    Current Outpatient Medications on File Prior to Visit  Medication Sig Dispense Refill  . aspirin 81 MG EC tablet Take 81 mg by mouth daily.     . Coenzyme Q10 (COQ-10) 50 MG CAPS Take 1 tablet by mouth daily.    . diphenhydrAMINE (BENADRYL ALLERGY) 25 MG tablet Take 25 mg by mouth every 6 (six) hours as needed.    . doxycycline (VIBRA-TABS) 100 MG tablet Take 1 tablet (100 mg total) by mouth 2 (two) times daily. 42 tablet 0  .  ibuprofen (ADVIL) 200 MG tablet Take 200 mg by mouth every 6 (six) hours as needed.    . loratadine (CLARITIN) 10 MG tablet Take 10 mg by mouth daily.    . meloxicam (MOBIC) 15 MG tablet Take 1 tablet (15 mg total) by mouth daily. 30 tablet 0  . metFORMIN (GLUCOPHAGE) 500 MG tablet Take 1 tablet by mouth daily with breakfast. Need office visit for more refills. 30 tablet 0  . rosuvastatin (CRESTOR) 10 MG tablet TAKE 1 TABLET BY MOUTH  DAILY 90 tablet 3   No current facility-administered medications on file prior to visit.    Past Medical History:  Diagnosis Date  . Acute asthmatic bronchitis   . Allergy    pollen  . BPH (benign prostatic hypertrophy)    mild  . C. difficile colitis   . CAD (coronary artery disease)   . Colitis   . Difficulty urinating 2017   self cath for all urination  . Fatty liver   . Hyperlipemia   . Hypothyroidism   . IBS (irritable bowel syndrome)   . Nonspecific colitis   . Rhinitis, allergic     Past Surgical History:  Procedure Laterality Date  . CATARACT EXTRACTION, BILATERAL Bilateral 2016  . COLONOSCOPY  2010  .  CORONARY ARTERY BYPASS GRAFT  2002  . EYE SURGERY    . LUMBAR LAMINECTOMY  1990    Social History   Socioeconomic History  . Marital status: Married    Spouse name: Not on file  . Number of children: 2  . Years of education: Not on file  . Highest education level: Not on file  Occupational History  . Occupation: Retired  Tobacco Use  . Smoking status: Never Smoker  . Smokeless tobacco: Never Used  Vaping Use  . Vaping Use: Never used  Substance and Sexual Activity  . Alcohol use: Yes    Alcohol/week: 3.0 standard drinks    Types: 3 Standard drinks or equivalent per week    Comment: occasionally  . Drug use: No  . Sexual activity: Yes  Other Topics Concern  . Not on file  Social History Narrative  . Not on file   Social Determinants of Health   Financial Resource Strain:   . Difficulty of Paying Living Expenses:    Food Insecurity:   . Worried About Charity fundraiser in the Last Year:   . Arboriculturist in the Last Year:   Transportation Needs:   . Film/video editor (Medical):   Marland Kitchen Lack of Transportation (Non-Medical):   Physical Activity:   . Days of Exercise per Week:   . Minutes of Exercise per Session:   Stress:   . Feeling of Stress :   Social Connections:   . Frequency of Communication with Friends and Family:   . Frequency of Social Gatherings with Friends and Family:   . Attends Religious Services:   . Active Member of Clubs or Organizations:   . Attends Archivist Meetings:   Marland Kitchen Marital Status:     Family History  Problem Relation Age of Onset  . Colon polyps Father   . Coronary artery disease Father   . Heart disease Father   . Diabetes Father   . Hyperlipidemia Father   . Coronary artery disease Mother   . Mental illness Mother   . Colon cancer Paternal Aunt   . Irritable bowel syndrome Neg Hx   . Stroke Neg Hx   . Alcohol abuse Neg Hx   . Cancer Neg Hx   . Depression Neg Hx   . Early death Neg Hx   . Hypertension Neg Hx   . Esophageal cancer Neg Hx   . Rectal cancer Neg Hx   . Stomach cancer Neg Hx     Review of Systems  Constitutional: Negative for chills and fever.  HENT: Positive for hearing loss. Negative for congestion, ear pain, sinus pressure and sore throat.   Neurological: Negative for dizziness and headaches.       Objective:   Vitals:   12/16/19 1115  BP: 120/70  Pulse: (!) 55  Temp: 98.4 F (36.9 C)  SpO2: 98%   BP Readings from Last 3 Encounters:  12/16/19 120/70  11/11/19 126/72  06/24/19 126/65   Wt Readings from Last 3 Encounters:  12/16/19 171 lb 9.6 oz (77.8 kg)  11/11/19 171 lb (77.6 kg)  11/11/19 169 lb (76.7 kg)   Body mass index is 26.09 kg/m.   Physical Exam    Skin: Rash along waistline of mid abdomen with an area of similar aperients just left of umbilicus.  Irregular borders slightly darker on the  borders, erythematous, later central region that is patchy   Ear exam: PRE-PROCEDURE EXAM: Right TM cannot  be visualized due to total occlusion/impaction of the ear canal.  Left TM barely visualized due to excessive wax but not total occlusion of the ear canal.  PROCEDURE INDICATION: remove wax to visualize ear drums & improve hearing CONSENT:  Verbal  PROCEDURE NOTE:   RIGHT EAR:  The CMA used a metal wax curette under direct vision with an otoscope to free the wax bolus from the ear wall and then successfully removed a small bit of wax. The ear was then irrigated with warm water to remove the remaining wax. POST- PROCEDURE EXAM: TMs successfully visualized and found to have no erythema   LEFT EAR:  The CMA used a metal wax curette under direct vision with an otoscope to free the wax bolus from the ear wall and then successfully removed a small bit of wax. The ear was then irrigated with warm water to remove the remaining wax. POST- PROCEDURE EXAM: TMs successfully visualized and found to have no erythema   Patient tolerated the procedure well.  Hearing improved.    Assessment & Plan:    See Problem List for Assessment and Plan of chronic medical problems.    This visit occurred during the SARS-CoV-2 public health emergency.  Safety protocols were in place, including screening questions prior to the visit, additional usage of staff PPE, and extensive cleaning of exam room while observing appropriate contact time as indicated for disinfecting solutions.

## 2019-12-16 ENCOUNTER — Ambulatory Visit: Payer: Medicare PPO | Admitting: Internal Medicine

## 2019-12-16 ENCOUNTER — Other Ambulatory Visit: Payer: Self-pay

## 2019-12-16 ENCOUNTER — Encounter: Payer: Self-pay | Admitting: Internal Medicine

## 2019-12-16 DIAGNOSIS — R21 Rash and other nonspecific skin eruption: Secondary | ICD-10-CM

## 2019-12-16 DIAGNOSIS — H9193 Unspecified hearing loss, bilateral: Secondary | ICD-10-CM

## 2019-12-16 DIAGNOSIS — H6123 Impacted cerumen, bilateral: Secondary | ICD-10-CM | POA: Diagnosis not present

## 2019-12-16 MED ORDER — CLOTRIMAZOLE-BETAMETHASONE 1-0.05 % EX CREA: 1.0000 "application " | TOPICAL_CREAM | Freq: Two times a day (BID) | CUTANEOUS | 0 refills | Status: DC

## 2019-12-16 NOTE — Assessment & Plan Note (Signed)
Acute Presented with decreased hearing Right ear canal impacted, left ear canal practically impacted - excessive cerumen B/l ear canals successfully cleaned out Hearing improved Discussed using ear drops preventatively every couple months

## 2019-12-16 NOTE — Assessment & Plan Note (Signed)
Acute Rash on abdomen along waistline and an area just left of umbilicus ?  Tinea versus nummular eczema Lotrisone cream twice daily Call if no improvement

## 2019-12-16 NOTE — Patient Instructions (Addendum)
You had excessive wax in your ears.  They were cleaned out today.     Use the lotrisone cream twice daily for your rash      Please call if there is no improvement in your symptoms.

## 2019-12-16 NOTE — Assessment & Plan Note (Signed)
Acute B/l hearing worse w/o pain or concerning symptoms Wears hearing aids B/l canals with excessive cerumen - obstruction in right ear Ear canals successfully cleaned out and hearing improved

## 2019-12-20 ENCOUNTER — Encounter: Payer: Self-pay | Admitting: Internal Medicine

## 2019-12-20 DIAGNOSIS — Z20822 Contact with and (suspected) exposure to covid-19: Secondary | ICD-10-CM | POA: Diagnosis not present

## 2019-12-20 DIAGNOSIS — N390 Urinary tract infection, site not specified: Secondary | ICD-10-CM | POA: Diagnosis not present

## 2019-12-20 DIAGNOSIS — R300 Dysuria: Secondary | ICD-10-CM | POA: Diagnosis not present

## 2019-12-20 NOTE — Telephone Encounter (Signed)
The pt states he is out of town for 2 weeks . and have a bladder infection ( strong urine odor . Dark cloudy color , pain / swelling in pelvic area ) is there anyway you can call in a antibiotic for infection. He states he is having a hard time to get to an urgent care. He is wanting to know can antibiotic be sent.Marland KitchenJohny Flowers

## 2020-01-05 DIAGNOSIS — Z20822 Contact with and (suspected) exposure to covid-19: Secondary | ICD-10-CM | POA: Diagnosis not present

## 2020-01-20 ENCOUNTER — Telehealth: Payer: Self-pay | Admitting: Podiatry

## 2020-01-20 NOTE — Telephone Encounter (Signed)
Pt left message stating his orthotics are not working for him they are causing him back problems.   I returned call and left message for pt to call back to schedule an appt with Liliane Channel so we can get the orthotics adjusted and working for pt.

## 2020-02-01 ENCOUNTER — Telehealth: Payer: Self-pay | Admitting: Podiatry

## 2020-02-01 NOTE — Telephone Encounter (Signed)
Pt left message @ 1223 today stating his new orthotics are not working he is having to change back to his old ones. He stated he left message a few weeks ago and never heard back.  I returned call and scheduled pt to see Liliane Channel.  I also had previously returned call and left message for pt to call to schedule an appt to see Liliane Channel

## 2020-02-17 ENCOUNTER — Ambulatory Visit: Payer: Medicare PPO | Admitting: Orthotics

## 2020-02-17 ENCOUNTER — Other Ambulatory Visit: Payer: Self-pay

## 2020-02-17 DIAGNOSIS — M779 Enthesopathy, unspecified: Secondary | ICD-10-CM

## 2020-02-17 DIAGNOSIS — Q828 Other specified congenital malformations of skin: Secondary | ICD-10-CM

## 2020-02-17 DIAGNOSIS — M79671 Pain in right foot: Secondary | ICD-10-CM

## 2020-02-17 NOTE — Progress Notes (Signed)
Took material out of f/o to make more flexible; he says we are going in right direction.  If he still has problems, I suggest we go with super soft orthotic.

## 2020-02-22 DIAGNOSIS — B356 Tinea cruris: Secondary | ICD-10-CM | POA: Diagnosis not present

## 2020-02-22 DIAGNOSIS — L218 Other seborrheic dermatitis: Secondary | ICD-10-CM | POA: Diagnosis not present

## 2020-02-22 DIAGNOSIS — L570 Actinic keratosis: Secondary | ICD-10-CM | POA: Diagnosis not present

## 2020-02-22 DIAGNOSIS — B353 Tinea pedis: Secondary | ICD-10-CM | POA: Diagnosis not present

## 2020-02-22 DIAGNOSIS — D692 Other nonthrombocytopenic purpura: Secondary | ICD-10-CM | POA: Diagnosis not present

## 2020-02-22 DIAGNOSIS — L84 Corns and callosities: Secondary | ICD-10-CM | POA: Diagnosis not present

## 2020-02-22 DIAGNOSIS — L812 Freckles: Secondary | ICD-10-CM | POA: Diagnosis not present

## 2020-02-22 DIAGNOSIS — B354 Tinea corporis: Secondary | ICD-10-CM | POA: Diagnosis not present

## 2020-02-22 DIAGNOSIS — B351 Tinea unguium: Secondary | ICD-10-CM | POA: Diagnosis not present

## 2020-02-24 ENCOUNTER — Encounter: Payer: Self-pay | Admitting: Internal Medicine

## 2020-03-01 ENCOUNTER — Other Ambulatory Visit: Payer: Self-pay

## 2020-03-01 ENCOUNTER — Ambulatory Visit: Payer: Medicare PPO | Admitting: Internal Medicine

## 2020-03-01 ENCOUNTER — Encounter: Payer: Self-pay | Admitting: Internal Medicine

## 2020-03-01 VITALS — BP 124/76 | HR 67 | Temp 98.2°F | Ht 68.0 in | Wt 172.0 lb

## 2020-03-01 DIAGNOSIS — M2550 Pain in unspecified joint: Secondary | ICD-10-CM

## 2020-03-01 DIAGNOSIS — M25551 Pain in right hip: Secondary | ICD-10-CM | POA: Insufficient documentation

## 2020-03-01 DIAGNOSIS — R7989 Other specified abnormal findings of blood chemistry: Secondary | ICD-10-CM | POA: Diagnosis not present

## 2020-03-01 DIAGNOSIS — R7303 Prediabetes: Secondary | ICD-10-CM | POA: Diagnosis not present

## 2020-03-01 DIAGNOSIS — M79675 Pain in left toe(s): Secondary | ICD-10-CM | POA: Diagnosis not present

## 2020-03-01 NOTE — Assessment & Plan Note (Signed)
Acute Joint pain Joint pain most likely is osteoarthritis-discussed symptomatic treatment with Tylenol or ibuprofen and ideally not overdoing either He does exercise regularly We will check CBC, CMP, ANA, CRP, RF, ESR and uric acid level Further evaluation of individual joints if no improvement

## 2020-03-01 NOTE — Assessment & Plan Note (Signed)
Chronic Exercising Stressed low sugar/carbohydrate diet Has lost some weight We will check A1c

## 2020-03-01 NOTE — Progress Notes (Signed)
Subjective:    Patient ID: Samuel Flowers, male    DOB: April 19, 1951, 69 y.o.   MRN: [id number]  HPI The patient is here for an acute visit.   Fatigue - mild.  He has noticed some mild fatigue and is unsure what it was from.  He was concerned about his most recent blood work.  right toes - tender - not by touch but inside.  It is when he walks and when sleeping.  Has not taken anything.  They were swollen, but his symptoms have improved.  His symptoms started about 4 weeks ago.  Symptoms are intermittent.  Bottom of his feet sometimes hurt-more towards the distal aspect of the foot-under the toes.  They hurt most first thing in the morning and sometimes get better the more he walks.  He denies heel pain.    Also right posterior hip pain - painful going up steps or sitting on it, lyaing on it.  This is more recent.  He has not tried taking anything for it.  Hands occ cramp up - only occur once in a while.  Knees hurt occasionally with carrying something heavy.    He is walking 6-8 miles a day.  His friend had blood work and had some autoimmune blood work done and uric acid level and he thinks he needs to have that done.  He was concerned about taking any ibuprofen because of his most recent blood work.  He was concerned about her sugars.  Medications and allergies reviewed with patient and updated if appropriate.  Patient Active Problem List   Diagnosis Date Noted  . Decreased hearing of both ears 12/16/2019  . Impacted cerumen of both ears 12/16/2019  . Rash 12/16/2019  . Tick bite 11/11/2019  . Insect bite of foot, right 11/11/2019  . Prediabetes 02/21/2019  . Paronychia of great toe, left 09/07/2018  . Gastrocnemius tear, left, sequela 07/29/2018  . Onychomycosis 06/16/2018  . Pain in left lower leg 06/16/2018  . Bladder retention, self caths 06/16/2018  . Pain of left lower extremity 08/07/2017  . Right foot pain 08/07/2017  . Left foot pain 08/07/2017  . Bilateral  plantar fasciitis 06/13/2014  . Hyperlipidemia LDL goal <70 05/12/2008  . Coronary atherosclerosis 05/12/2008  . ALLERGIC RHINITIS 05/12/2008  . Irritable bowel syndrome 05/12/2008    Current Outpatient Medications on File Prior to Visit  Medication Sig Dispense Refill  . aspirin 81 MG EC tablet Take 81 mg by mouth daily.     . clotrimazole-betamethasone (LOTRISONE) cream Apply 1 application topically 2 (two) times daily. 30 g 0  . Coenzyme Q10 (COQ-10) 50 MG CAPS Take 1 tablet by mouth daily.    . diphenhydrAMINE (BENADRYL ALLERGY) 25 MG tablet Take 25 mg by mouth every 6 (six) hours as needed.    Marland Kitchen ibuprofen (ADVIL) 200 MG tablet Take 200 mg by mouth every 6 (six) hours as needed.    . loratadine (CLARITIN) 10 MG tablet Take 10 mg by mouth daily.    . meloxicam (MOBIC) 15 MG tablet Take 1 tablet (15 mg total) by mouth daily. 30 tablet 0  . metFORMIN (GLUCOPHAGE) 500 MG tablet Take 1 tablet by mouth daily with breakfast. Need office visit for more refills. 30 tablet 0  . rosuvastatin (CRESTOR) 10 MG tablet TAKE 1 TABLET BY MOUTH  DAILY 90 tablet 3   No current facility-administered medications on file prior to visit.    Past Medical History:  Diagnosis Date  .  Acute asthmatic bronchitis   . Allergy    pollen  . BPH (benign prostatic hypertrophy)    mild  . C. difficile colitis   . CAD (coronary artery disease)   . Colitis   . Difficulty urinating 2017   self cath for all urination  . Fatty liver   . Hyperlipemia   . Hypothyroidism   . IBS (irritable bowel syndrome)   . Nonspecific colitis   . Rhinitis, allergic     Past Surgical History:  Procedure Laterality Date  . CATARACT EXTRACTION, BILATERAL Bilateral 2016  . COLONOSCOPY  2010  . CORONARY ARTERY BYPASS GRAFT  2002  . EYE SURGERY    . LUMBAR LAMINECTOMY  1990    Social History   Socioeconomic History  . Marital status: Married    Spouse name: Not on file  . Number of children: 2  . Years of education:  Not on file  . Highest education level: Not on file  Occupational History  . Occupation: Retired  Tobacco Use  . Smoking status: Never Smoker  . Smokeless tobacco: Never Used  Vaping Use  . Vaping Use: Never used  Substance and Sexual Activity  . Alcohol use: Yes    Alcohol/week: 3.0 standard drinks    Types: 3 Standard drinks or equivalent per week    Comment: occasionally  . Drug use: No  . Sexual activity: Yes  Other Topics Concern  . Not on file  Social History Narrative  . Not on file   Social Determinants of Health   Financial Resource Strain:   . Difficulty of Paying Living Expenses: Not on file  Food Insecurity:   . Worried About Charity fundraiser in the Last Year: Not on file  . Ran Out of Food in the Last Year: Not on file  Transportation Needs:   . Lack of Transportation (Medical): Not on file  . Lack of Transportation (Non-Medical): Not on file  Physical Activity:   . Days of Exercise per Week: Not on file  . Minutes of Exercise per Session: Not on file  Stress:   . Feeling of Stress : Not on file  Social Connections:   . Frequency of Communication with Friends and Family: Not on file  . Frequency of Social Gatherings with Friends and Family: Not on file  . Attends Religious Services: Not on file  . Active Member of Clubs or Organizations: Not on file  . Attends Archivist Meetings: Not on file  . Marital Status: Not on file    Family History  Problem Relation Age of Onset  . Colon polyps Father   . Coronary artery disease Father   . Heart disease Father   . Diabetes Father   . Hyperlipidemia Father   . Coronary artery disease Mother   . Mental illness Mother   . Colon cancer Paternal Aunt   . Irritable bowel syndrome Neg Hx   . Stroke Neg Hx   . Alcohol abuse Neg Hx   . Cancer Neg Hx   . Depression Neg Hx   . Early death Neg Hx   . Hypertension Neg Hx   . Esophageal cancer Neg Hx   . Rectal cancer Neg Hx   . Stomach cancer Neg  Hx     Review of Systems  Constitutional: Negative for fever.  Respiratory: Positive for shortness of breath (occ steep inclines).   Cardiovascular: Negative for chest pain, palpitations and leg swelling.  Musculoskeletal: Positive for  arthralgias and back pain (certain positions - sitting too much). Negative for joint swelling.  Skin: Positive for rash (fungal).  Neurological: Negative for light-headedness and headaches.       Objective:   Vitals:   03/01/20 1446  BP: 124/76  Pulse: 67  Temp: 98.2 F (36.8 C)  SpO2: 94%   BP Readings from Last 3 Encounters:  03/01/20 124/76  12/16/19 120/70  11/11/19 126/72   Wt Readings from Last 3 Encounters:  03/01/20 172 lb (78 kg)  12/16/19 171 lb 9.6 oz (77.8 kg)  11/11/19 171 lb (77.6 kg)   Body mass index is 26.15 kg/m.   Physical Exam Constitutional:      General: He is not in acute distress.    Appearance: Normal appearance. He is not ill-appearing.  HENT:     Head: Normocephalic and atraumatic.  Cardiovascular:     Rate and Rhythm: Normal rate and regular rhythm.  Pulmonary:     Effort: Pulmonary effort is normal. No respiratory distress.     Breath sounds: No wheezing or rales.  Musculoskeletal:        General: No swelling (No joint swelling or synovitis) or deformity.     Right lower leg: No edema.     Left lower leg: No edema.     Comments: Minimal tenderness right toes 2-4, at MTP joints.  No other toe pain.  No erythema or swelling.  No heel pain on right foot  Skin:    General: Skin is warm and dry.  Neurological:     Mental Status: He is alert.            Assessment & Plan:    See Problem List for Assessment and Plan of chronic medical problems.    This visit occurred during the SARS-CoV-2 public health emergency.  Safety protocols were in place, including screening questions prior to the visit, additional usage of staff PPE, and extensive cleaning of exam room while observing appropriate  contact time as indicated for disinfecting solutions.

## 2020-03-01 NOTE — Patient Instructions (Addendum)
  Blood work was ordered.     Medications reviewed and updated.  Changes include :   Try ibuprofen for your pains - if no improvement let me know.

## 2020-03-01 NOTE — Assessment & Plan Note (Signed)
Acute Pain in posterior right hip/buttock region Possibly tendinitis or muscle inflammation Discussed ibuprofen and some stretching exercises If no improvement he will contact me and I can refer him to sports medicine

## 2020-03-01 NOTE — Assessment & Plan Note (Signed)
Acute Left toes hurt near MTP joint-toes 2-4 He states this is been going on intermittently for 4 weeks Some days they are swollen Today toes appear benign, mild tenderness at MTP joints No indication of infection or gout Possibly osteoarthritis Symptomatic treatment

## 2020-03-01 NOTE — Assessment & Plan Note (Signed)
Subacute last blood work showed elevated LFTs CMP today

## 2020-03-02 LAB — CBC WITH DIFFERENTIAL/PLATELET
Basophils Absolute: 0.1 10*3/uL (ref 0.0–0.1)
Basophils Relative: 0.7 % (ref 0.0–3.0)
Eosinophils Absolute: 0.3 10*3/uL (ref 0.0–0.7)
Eosinophils Relative: 4.5 % (ref 0.0–5.0)
HCT: 42.1 % (ref 39.0–52.0)
Hemoglobin: 14.4 g/dL (ref 13.0–17.0)
Lymphocytes Relative: 17.3 % (ref 12.0–46.0)
Lymphs Abs: 1.2 10*3/uL (ref 0.7–4.0)
MCHC: 34.2 g/dL (ref 30.0–36.0)
MCV: 93.6 fl (ref 78.0–100.0)
Monocytes Absolute: 0.6 10*3/uL (ref 0.1–1.0)
Monocytes Relative: 8.3 % (ref 3.0–12.0)
Neutro Abs: 4.9 10*3/uL (ref 1.4–7.7)
Neutrophils Relative %: 69.2 % (ref 43.0–77.0)
Platelets: 204 10*3/uL (ref 150.0–400.0)
RBC: 4.5 Mil/uL (ref 4.22–5.81)
RDW: 13.2 % (ref 11.5–15.5)
WBC: 7.1 10*3/uL (ref 4.0–10.5)

## 2020-03-02 LAB — C-REACTIVE PROTEIN: CRP: <1 mg/dL (ref 0.5–20.0)

## 2020-03-02 LAB — URIC ACID: Uric Acid, Serum: 7.8 mg/dL (ref 4.0–7.8)

## 2020-03-02 LAB — HEMOGLOBIN A1C: Hgb A1c MFr Bld: 6.1 % (ref 4.6–6.5)

## 2020-03-02 LAB — SEDIMENTATION RATE: Sed Rate: 17 mm/hr (ref 0–20)

## 2020-03-03 ENCOUNTER — Encounter: Payer: Self-pay | Admitting: Internal Medicine

## 2020-03-03 LAB — COMPREHENSIVE METABOLIC PANEL
ALT: 20 U/L (ref 0–53)
AST: 22 U/L (ref 0–37)
Albumin: 3.9 g/dL (ref 3.5–5.2)
Alkaline Phosphatase: 54 U/L (ref 39–117)
BUN: 16 mg/dL (ref 6–23)
CO2: 30 mEq/L (ref 19–32)
Calcium: 9.5 mg/dL (ref 8.4–10.5)
Chloride: 101 mEq/L (ref 96–112)
Creatinine, Ser: 0.94 mg/dL (ref 0.40–1.50)
GFR: 88 mL/min (ref >60.00)
Glucose, Bld: 81 mg/dL (ref 70–99)
Potassium: 4.3 mEq/L (ref 3.5–5.1)
Sodium: 138 mEq/L (ref 135–145)
Total Bilirubin: 0.3 mg/dL (ref 0.2–1.2)
Total Protein: 6.5 g/dL (ref 6.0–8.3)

## 2020-03-03 LAB — RHEUMATOID FACTOR: Rheumatoid fact SerPl-aCnc: <14 IU/mL (ref <14)

## 2020-03-03 LAB — ANA: Anti Nuclear Antibody (ANA): NEGATIVE

## 2020-03-04 ENCOUNTER — Encounter: Payer: Self-pay | Admitting: Internal Medicine

## 2020-03-13 ENCOUNTER — Encounter: Payer: Self-pay | Admitting: *Deleted

## 2020-03-13 DIAGNOSIS — E785 Hyperlipidemia, unspecified: Secondary | ICD-10-CM

## 2020-03-16 DIAGNOSIS — R3914 Feeling of incomplete bladder emptying: Secondary | ICD-10-CM | POA: Diagnosis not present

## 2020-03-25 ENCOUNTER — Other Ambulatory Visit: Payer: Self-pay | Admitting: Internal Medicine

## 2020-03-29 ENCOUNTER — Ambulatory Visit: Payer: Medicare PPO | Admitting: Podiatry

## 2020-03-30 ENCOUNTER — Ambulatory Visit: Payer: Medicare PPO | Admitting: Orthotics

## 2020-03-30 ENCOUNTER — Ambulatory Visit (INDEPENDENT_AMBULATORY_CARE_PROVIDER_SITE_OTHER): Payer: Medicare PPO

## 2020-03-30 ENCOUNTER — Other Ambulatory Visit: Payer: Self-pay

## 2020-03-30 ENCOUNTER — Ambulatory Visit: Payer: Medicare PPO | Admitting: Podiatry

## 2020-03-30 DIAGNOSIS — M79671 Pain in right foot: Secondary | ICD-10-CM

## 2020-03-30 DIAGNOSIS — M79674 Pain in right toe(s): Secondary | ICD-10-CM

## 2020-03-30 DIAGNOSIS — M778 Other enthesopathies, not elsewhere classified: Secondary | ICD-10-CM | POA: Diagnosis not present

## 2020-03-30 DIAGNOSIS — M779 Enthesopathy, unspecified: Secondary | ICD-10-CM | POA: Diagnosis not present

## 2020-03-30 DIAGNOSIS — B351 Tinea unguium: Secondary | ICD-10-CM | POA: Diagnosis not present

## 2020-03-30 DIAGNOSIS — Q828 Other specified congenital malformations of skin: Secondary | ICD-10-CM

## 2020-03-30 MED ORDER — MELOXICAM 15 MG PO TABS: 15.0000 mg | ORAL_TABLET | Freq: Every day | ORAL | 2 refills | Status: DC

## 2020-03-30 NOTE — Progress Notes (Signed)
Adding following modifications:   1) add scaphoid pad to hug cavus arch 2) met bar, 3) 1/8" ff cushion as well as spenco cover.

## 2020-03-31 NOTE — Progress Notes (Signed)
Subjective:   Patient ID: Samuel Flowers, male   DOB: 69 y.o.   MRN: [id number]   HPI Patient presents stating he has developed a lot of pain in his right second toe that is been sore and making it hard to walk and he thinks maybe the toe was lifted in the air somewhat.  States is been over the last couple months and also is concerned about nail disease with thickness and what kind of treatment we could consider   ROS      Objective:  Physical Exam  Neurovascular status intact with inflammation pain around the second MPJ right with fluid buildup around the joint surface.  Also noted to have mild elevation of the second toe and does have discoloration of the nailbeds localized with no proximal spread     Assessment:  Inflammatory capsulitis second MPJ right with pain and possible digital deformity along with mycotic nail infection bilateral     Plan:  H&P conditions reviewed and I did go ahead and educated him on inflammatory capsulitis.  I did a proximal nerve block I aspirated the joint getting out a small amount of clear fluid injected quarter cc dexamethasone Kenalog and then decided to pad the area.  I also discussed his nail disease and we discussed treatment options and he will continue with trimming technique and keeping them clean and do not recommend oral medicine at the current time  X-rays are negative for signs of fracture arthritis with moderate elevation of the second digit on lateral view

## 2020-04-12 DIAGNOSIS — H35371 Puckering of macula, right eye: Secondary | ICD-10-CM | POA: Diagnosis not present

## 2020-04-12 DIAGNOSIS — H18613 Keratoconus, stable, bilateral: Secondary | ICD-10-CM | POA: Diagnosis not present

## 2020-04-12 DIAGNOSIS — E119 Type 2 diabetes mellitus without complications: Secondary | ICD-10-CM | POA: Diagnosis not present

## 2020-04-12 DIAGNOSIS — Z961 Presence of intraocular lens: Secondary | ICD-10-CM | POA: Diagnosis not present

## 2020-04-19 ENCOUNTER — Telehealth: Payer: Self-pay | Admitting: Cardiology

## 2020-04-19 MED ORDER — ROSUVASTATIN CALCIUM 10 MG PO TABS
10.0000 mg | ORAL_TABLET | Freq: Every day | ORAL | 1 refills | Status: DC
Start: 2020-04-19 — End: 2020-10-06

## 2020-04-19 NOTE — Telephone Encounter (Signed)
Returned call to patient, advised due for Lipid panel.   Will send 90 day to pharmacy and he will get labs rechecked.  Patient aware.

## 2020-04-19 NOTE — Telephone Encounter (Signed)
*  STAT* If patient is at the pharmacy, call can be transferred to refill team.   1. Which medications need to be refilled? (please list name of each medication and dose if known)  rosuvastatin (CRESTOR) 10 MG tablet  2. Which pharmacy/location (including street and city if local pharmacy) is medication to be sent to? [id number] - [place], [place] [address] AT [place]  3. Do they need a 30 day or 90 day supply? 90   Patient is out of medication

## 2020-04-28 DIAGNOSIS — N401 Enlarged prostate with lower urinary tract symptoms: Secondary | ICD-10-CM | POA: Diagnosis not present

## 2020-05-03 ENCOUNTER — Other Ambulatory Visit: Payer: Self-pay

## 2020-05-03 ENCOUNTER — Other Ambulatory Visit: Payer: Self-pay | Admitting: Internal Medicine

## 2020-05-03 ENCOUNTER — Other Ambulatory Visit (INDEPENDENT_AMBULATORY_CARE_PROVIDER_SITE_OTHER): Payer: Medicare PPO

## 2020-05-03 DIAGNOSIS — N312 Flaccid neuropathic bladder, not elsewhere classified: Secondary | ICD-10-CM | POA: Diagnosis not present

## 2020-05-03 DIAGNOSIS — I2510 Atherosclerotic heart disease of native coronary artery without angina pectoris: Secondary | ICD-10-CM

## 2020-05-03 DIAGNOSIS — N130 Hydronephrosis with ureteropelvic junction obstruction: Secondary | ICD-10-CM | POA: Diagnosis not present

## 2020-05-03 DIAGNOSIS — R3914 Feeling of incomplete bladder emptying: Secondary | ICD-10-CM | POA: Diagnosis not present

## 2020-05-03 DIAGNOSIS — N35011 Post-traumatic bulbous urethral stricture: Secondary | ICD-10-CM | POA: Diagnosis not present

## 2020-05-03 LAB — HEPATIC FUNCTION PANEL
ALT: 24 U/L (ref 0–53)
AST: 22 U/L (ref 0–37)
Albumin: 4.1 g/dL (ref 3.5–5.2)
Alkaline Phosphatase: 53 U/L (ref 39–117)
Bilirubin, Direct: 0.1 mg/dL (ref 0.0–0.3)
Total Bilirubin: 0.6 mg/dL (ref 0.2–1.2)
Total Protein: 7 g/dL (ref 6.0–8.3)

## 2020-05-03 LAB — LIPID PANEL
Cholesterol: 153 mg/dL (ref 0–200)
HDL: 47.3 mg/dL (ref >39.00)
LDL Cholesterol: 76 mg/dL (ref 0–99)
NonHDL: 105.79
Total CHOL/HDL Ratio: 3
Triglycerides: 151 mg/dL — ABNORMAL HIGH (ref 0.0–149.0)
VLDL: 30.2 mg/dL (ref 0.0–40.0)

## 2020-05-04 ENCOUNTER — Telehealth: Payer: Self-pay

## 2020-05-04 DIAGNOSIS — Z79899 Other long term (current) drug therapy: Secondary | ICD-10-CM

## 2020-05-04 NOTE — Telephone Encounter (Signed)
Resume crestor 10 mg daily; lipids and liver 12 weeks [name]

## 2020-05-04 NOTE — Telephone Encounter (Addendum)
Pt informed of lab results along with MD's recommendations. Pt state he wanted to make Dr. Stanford Breed aware that he was not taking crestor 10 mg for roughly 6-7 weeks and just restarted 2 weeks ago. Pt questioning if he need to continue with current dose or increase to 20 mg as recommended.   ----- Message from Lelon Perla, MD sent at 05/03/2020  4:02 PM EST ----- Change crestor to 20 mg daily; lipids and liver 12 weeks [name]

## 2020-05-08 NOTE — Telephone Encounter (Signed)
Patient made aware of Dr. Ludwig Clarks recommendation. Patient will continue with Crestor 20mg  daily. Patient will return in 12 weeks for lipid and liver blood work.   Orders placed for Lipid panel, and Hepatic Function Panel.

## 2020-05-26 ENCOUNTER — Other Ambulatory Visit: Payer: Self-pay | Admitting: Podiatry

## 2020-05-26 DIAGNOSIS — M778 Other enthesopathies, not elsewhere classified: Secondary | ICD-10-CM

## 2020-05-30 ENCOUNTER — Telehealth: Payer: Self-pay | Admitting: Internal Medicine

## 2020-05-30 NOTE — Telephone Encounter (Signed)
LVM for pt to rtn my call to schedule AWV with [facility]. Please schedule this appt if pt calls the office.  °

## 2020-07-11 ENCOUNTER — Other Ambulatory Visit: Payer: Self-pay | Admitting: Podiatry

## 2020-09-11 DIAGNOSIS — R339 Retention of urine, unspecified: Secondary | ICD-10-CM | POA: Diagnosis not present

## 2020-09-14 ENCOUNTER — Telehealth: Payer: Self-pay | Admitting: Podiatry

## 2020-09-14 NOTE — Telephone Encounter (Signed)
Pt left message asking about getting a second pair of orthotics.  I returned call and left a message for pt that I sent Dr Paulla Dolly a message and will call him when I hear back from him.

## 2020-09-15 NOTE — Telephone Encounter (Signed)
Left message for pt to call to discuss as I have talked with Dr Paulla Dolly and received  the information I was needing.

## 2020-09-22 DIAGNOSIS — M779 Enthesopathy, unspecified: Secondary | ICD-10-CM

## 2020-09-28 DIAGNOSIS — Z20822 Contact with and (suspected) exposure to covid-19: Secondary | ICD-10-CM | POA: Diagnosis not present

## 2020-10-04 NOTE — Telephone Encounter (Signed)
Pt did call back on 5.13 and wanted to proceed with ordering the second pair of orthotics for 219.00.  Orthotics are in and I left message ok to pick up.

## 2020-10-06 ENCOUNTER — Other Ambulatory Visit: Payer: Self-pay | Admitting: Cardiology

## 2020-10-17 ENCOUNTER — Other Ambulatory Visit: Payer: Self-pay | Admitting: Internal Medicine

## 2020-10-21 DIAGNOSIS — S20461A Insect bite (nonvenomous) of right back wall of thorax, initial encounter: Secondary | ICD-10-CM | POA: Diagnosis not present

## 2020-10-21 DIAGNOSIS — W57XXXA Bitten or stung by nonvenomous insect and other nonvenomous arthropods, initial encounter: Secondary | ICD-10-CM | POA: Diagnosis not present

## 2020-10-21 DIAGNOSIS — Z20822 Contact with and (suspected) exposure to covid-19: Secondary | ICD-10-CM | POA: Diagnosis not present

## 2020-11-01 DIAGNOSIS — M9903 Segmental and somatic dysfunction of lumbar region: Secondary | ICD-10-CM | POA: Diagnosis not present

## 2020-11-01 DIAGNOSIS — M9904 Segmental and somatic dysfunction of sacral region: Secondary | ICD-10-CM | POA: Diagnosis not present

## 2020-11-01 DIAGNOSIS — M9902 Segmental and somatic dysfunction of thoracic region: Secondary | ICD-10-CM | POA: Diagnosis not present

## 2020-11-01 DIAGNOSIS — M5136 Other intervertebral disc degeneration, lumbar region: Secondary | ICD-10-CM | POA: Diagnosis not present

## 2020-11-01 DIAGNOSIS — M9901 Segmental and somatic dysfunction of cervical region: Secondary | ICD-10-CM | POA: Diagnosis not present

## 2020-11-06 DIAGNOSIS — M5136 Other intervertebral disc degeneration, lumbar region: Secondary | ICD-10-CM | POA: Diagnosis not present

## 2020-11-06 DIAGNOSIS — M9901 Segmental and somatic dysfunction of cervical region: Secondary | ICD-10-CM | POA: Diagnosis not present

## 2020-11-06 DIAGNOSIS — M9902 Segmental and somatic dysfunction of thoracic region: Secondary | ICD-10-CM | POA: Diagnosis not present

## 2020-11-06 DIAGNOSIS — M9903 Segmental and somatic dysfunction of lumbar region: Secondary | ICD-10-CM | POA: Diagnosis not present

## 2020-11-06 DIAGNOSIS — M9904 Segmental and somatic dysfunction of sacral region: Secondary | ICD-10-CM | POA: Diagnosis not present

## 2020-11-07 ENCOUNTER — Encounter: Payer: Self-pay | Admitting: Physician Assistant

## 2020-11-07 ENCOUNTER — Ambulatory Visit: Payer: Medicare PPO | Admitting: Physician Assistant

## 2020-11-07 ENCOUNTER — Other Ambulatory Visit: Payer: Self-pay

## 2020-11-07 VITALS — BP 130/79 | HR 52 | Ht 68.0 in | Wt 172.8 lb

## 2020-11-07 DIAGNOSIS — E785 Hyperlipidemia, unspecified: Secondary | ICD-10-CM | POA: Diagnosis not present

## 2020-11-07 DIAGNOSIS — R0602 Shortness of breath: Secondary | ICD-10-CM | POA: Diagnosis not present

## 2020-11-07 DIAGNOSIS — E039 Hypothyroidism, unspecified: Secondary | ICD-10-CM | POA: Diagnosis not present

## 2020-11-07 DIAGNOSIS — I25810 Atherosclerosis of coronary artery bypass graft(s) without angina pectoris: Secondary | ICD-10-CM

## 2020-11-07 DIAGNOSIS — R001 Bradycardia, unspecified: Secondary | ICD-10-CM

## 2020-11-07 DIAGNOSIS — I2510 Atherosclerotic heart disease of native coronary artery without angina pectoris: Secondary | ICD-10-CM | POA: Diagnosis not present

## 2020-11-07 LAB — HEPATIC FUNCTION PANEL
ALT: 22 IU/L (ref 0–44)
AST: 25 IU/L (ref 0–40)
Albumin: 4.6 g/dL (ref 3.8–4.8)
Alkaline Phosphatase: 62 IU/L (ref 44–121)
Bilirubin Total: 0.4 mg/dL (ref 0.0–1.2)
Bilirubin, Direct: 0.11 mg/dL (ref 0.00–0.40)
Total Protein: 7 g/dL (ref 6.0–8.5)

## 2020-11-07 LAB — LIPID PANEL
Chol/HDL Ratio: 3.2 ratio (ref 0.0–5.0)
Cholesterol, Total: 155 mg/dL (ref 100–199)
HDL: 49 mg/dL (ref >39)
LDL Chol Calc (NIH): 68 mg/dL (ref 0–99)
Triglycerides: 234 mg/dL — ABNORMAL HIGH (ref 0–149)
VLDL Cholesterol Cal: 38 mg/dL (ref 5–40)

## 2020-11-07 MED ORDER — ROSUVASTATIN CALCIUM 20 MG PO TABS: 20.0000 mg | ORAL_TABLET | Freq: Every day | ORAL | 3 refills | Status: DC

## 2020-11-07 NOTE — Patient Instructions (Signed)
Medication Instructions:  INCREASE rosuvastatin (Crestor) to 20mg  (1 tablet) daily.   *If you need a refill on your cardiac medications before your next appointment, please call your pharmacy*   Lab Work: BMET, CBC, TSH to be drawn today  Lipid, Lipid NMR, Hepatic panel to be drawn FASTING in 1-2 months.   If you have labs (blood work) drawn today and your tests are completely normal, you will receive your results only by: [place] (if you have MyChart) OR A paper copy in the mail If you have any lab test that is abnormal or we need to change your treatment, we will call you to review the results.   Testing/Procedures: Your physician has requested that you have a lexiscan myoview. For further information please visit [email or web address]. Please follow instruction sheet, as given.    Follow-Up: At [facility], you and your health needs are our priority.  As part of our continuing mission to provide you with exceptional heart care, we have created designated Provider Care Teams.  These Care Teams include your primary Cardiologist (physician) and Advanced Practice Providers (APPs -  Physician Assistants and Nurse Practitioners) who all work together to provide you with the care you need, when you need it.  We recommend signing up for the patient portal called "MyChart".  Sign up information is provided on this After Visit Summary.  MyChart is used to connect with patients for Virtual Visits (Telemedicine).  Patients are able to view lab/test results, encounter notes, upcoming appointments, etc.  Non-urgent messages can be sent to your provider as well.   To learn more about what you can do with MyChart, go to [email or web address].    Your next appointment:   12 month(s) -  Per Almyra Deforest, PA, if your lexiscan is normal you will follow up with Dr. Stanford Breed in 1 year, if not our office will see you sooner.   The format for your next appointment:   In Person  Provider:   [name], MD

## 2020-11-07 NOTE — Progress Notes (Signed)
Cardiology Office Note:    Date:  11/09/2020   ID:  Samuel Flowers, DOB Apr 23, 1951, MRN [id number]  PCP:  Binnie Rail, MD   [facility] Providers Cardiologist:  [name], MD {  Referring MD: Binnie Rail, MD   Chief Complaint  Patient presents with   Shortness of Breath    Seen for Dr. Stanford Breed     History of Present Illness:    Samuel Flowers is a 70 y.o. male with a hx of CAD S/P CABG 2002, hyperlipidemia and hypothyroidism.  Cardiac catheterization in 2003 revealed patent grafts, there was a stenosis in the posterolateral branch that was not grafted.  Myoview in November 2017 showed EF 62%, probable diaphragmatic attenuation, mild inferior ischemia could not be excluded.  Patient was last seen by Dr. Stanford Breed on 09/30/2019 at which time he was doing well.  Myoview obtained in July 2021 was low risk was small in inferolateral defect.  Last lipid panel obtained in December 2021 showed uncontrolled LDL, he was instructed to increase Crestor to 20 mg daily.  However patient never increased to Crestor as he was previously not that compliant with Crestor anyway, he wished to try the 10 mg Crestor first and repeat some blood work.  Patient presents today for follow-up.  His primary concern today is dyspnea on exertion which is new.  He has some atypical chest pain has been going on for the past 2 years both at rest and with exertion.  He generally walks about 6 miles per day.  He has a history of baseline asymptomatic bradycardia.  In the past few months, he has been noticing increased dyspnea on exertion.  I recommended a repeat Myoview.  I also recommended basic metabolic panel, CBC and a TSH to rule out secondary causes.  He has missed 2 weeks of Crestor 1 he traveled to another city recently.  I discussed the case with Dr. Stanford Breed, we will increase his to [facility] for to 20 mg daily.  He will need a fasting lipid panel and a liver function test in 1 to 2 months.  He is also  interested in the lipid profile to look at particle size again.  Past Medical History:  Diagnosis Date   Acute asthmatic bronchitis    Allergy    pollen   BPH (benign prostatic hypertrophy)    mild   C. difficile colitis    CAD (coronary artery disease)    Colitis    Difficulty urinating 2017   self cath for all urination   Fatty liver    Hyperlipemia    Hypothyroidism    IBS (irritable bowel syndrome)    Nonspecific colitis    Rhinitis, allergic     Past Surgical History:  Procedure Laterality Date   CATARACT EXTRACTION, BILATERAL Bilateral 2016   COLONOSCOPY  2010   CORONARY ARTERY BYPASS GRAFT  2002   EYE SURGERY     LUMBAR LAMINECTOMY  1990    Current Medications: Current Meds  Medication Sig   aspirin 81 MG EC tablet Take 81 mg by mouth daily.    diphenhydrAMINE (BENADRYL) 25 MG tablet Take 25 mg by mouth every 6 (six) hours as needed.   ibuprofen (ADVIL) 200 MG tablet Take 200 mg by mouth every 6 (six) hours as needed.   loratadine (CLARITIN) 10 MG tablet Take 10 mg by mouth daily.   meloxicam (MOBIC) 15 MG tablet Take 1 tablet (15 mg total) by mouth daily.   metFORMIN (GLUCOPHAGE)  500 MG tablet TAKE 1 TABLET BY MOUTH DAILY WITH BREAKFAST   terbinafine (LAMISIL) 250 MG tablet    [DISCONTINUED] rosuvastatin (CRESTOR) 10 MG tablet TAKE 1 TABLET(10 MG) BY MOUTH DAILY     Allergies:   Patient has no known allergies.   Social History   Socioeconomic History   Marital status: Married    Spouse name: Not on file   Number of children: 2   Years of education: Not on file   Highest education level: Not on file  Occupational History   Occupation: Retired  Tobacco Use   Smoking status: Never   Smokeless tobacco: Never  Vaping Use   Vaping Use: Never used  Substance and Sexual Activity   Alcohol use: Yes    Alcohol/week: 3.0 standard drinks    Types: 3 Standard drinks or equivalent per week    Comment: occasionally   Drug use: No   Sexual activity: Yes   Other Topics Concern   Not on file  Social History Narrative   Not on file   Social Determinants of Health   Financial Resource Strain: Not on file  Food Insecurity: Not on file  Transportation Needs: Not on file  Physical Activity: Not on file  Stress: Not on file  Social Connections: Not on file     Family History: The patient's family history includes Colon cancer in his paternal aunt; Colon polyps in his father; Coronary artery disease in his father and mother; Diabetes in his father; Heart disease in his father; Hyperlipidemia in his father; Mental illness in his mother. There is no history of Irritable bowel syndrome, Stroke, Alcohol abuse, Cancer, Depression, Early death, Hypertension, Esophageal cancer, Rectal cancer, or Stomach cancer.  ROS:   Please see the history of present illness.     All other systems reviewed and are negative.  EKGs/Labs/Other Studies Reviewed:    The following studies were reviewed today:  Myoview 11/11/2019 Nuclear stress EF: 65%. Defect 1: There is a small defect of mild severity present in the basal inferolateral location. There was no ST segment deviation noted during stress. No T wave inversion was noted during stress.   Low risk stress nuclear study with a small area of basal inferolateral ischemia. Normal left ventricular regional and global systolic function.  EKG:  EKG is ordered today.  The ekg ordered today demonstrates sinus bradycardia, heart rate 49 bpm.  No significant ST-T wave changes.  Recent Labs: 03/01/2020: BUN 16; Creatinine, Ser 0.94; Hemoglobin 14.4; Platelets 204.0; Potassium 4.3; Sodium 138 11/07/2020: ALT 22  Recent Lipid Panel    Component Value Date/Time   CHOL 155 11/07/2020 1135   TRIG 234 (H) 11/07/2020 1135   HDL 49 11/07/2020 1135   CHOLHDL 3.2 11/07/2020 1135   CHOLHDL 3 05/03/2020 0901   VLDL 30.2 05/03/2020 0901   LDLCALC 68 11/07/2020 1135   LDLDIRECT 143.9 03/09/2013 0913     Risk  Assessment/Calculations:           Physical Exam:    VS:  BP 130/79   Pulse (!) 52   Ht 5\' 8"  (1.727 m)   Wt 172 lb 12.8 oz (78.4 kg)   SpO2 97%   BMI 26.27 kg/m     Wt Readings from Last 3 Encounters:  11/07/20 172 lb 12.8 oz (78.4 kg)  03/01/20 172 lb (78 kg)  12/16/19 171 lb 9.6 oz (77.8 kg)     GEN:  Well nourished, well developed in no acute distress HEENT: Normal  NECK: No JVD; No carotid bruits LYMPHATICS: No lymphadenopathy CARDIAC: RRR, no murmurs, rubs, gallops RESPIRATORY:  Clear to auscultation without rales, wheezing or rhonchi  ABDOMEN: Soft, non-tender, non-distended MUSCULOSKELETAL:  No edema; No deformity  SKIN: Warm and dry NEUROLOGIC:  Alert and oriented x 3 PSYCHIATRIC:  Normal affect   ASSESSMENT:    1. Shortness of breath   2. Hyperlipidemia LDL goal <70   3. Atherosclerosis of coronary artery bypass graft of native heart without angina pectoris   4. Hypothyroidism, unspecified type    PLAN:    In order of problems listed above:  Shortness of breath with exertion: This is a new symptom.  I am concerned of anginal equivalent.  We discussed various options, I recommend a Myoview in this case to rule out potentially ischemic component  Hyperlipidemia: On Crestor  CAD s/p CABG: Continue aspirin and a statin.  He has chronic atypical chest pain.  Hypothyroidism: Managed by primary care provider  Bradycardia: Patient has chronic asymptomatic bradycardia.  Not on any AV nodal blocking agent   Shared Decision Making/Informed Consent The risks [chest pain, shortness of breath, cardiac arrhythmias, dizziness, blood pressure fluctuations, myocardial infarction, stroke/transient ischemic attack, nausea, vomiting, allergic reaction, radiation exposure, metallic taste sensation and life-threatening complications (estimated to be 1 in 10,000)], benefits (risk stratification, diagnosing coronary artery disease, treatment guidance) and alternatives of a  nuclear stress test were discussed in detail with Mr. Eatherly and he agrees to proceed.    Medication Adjustments/Labs and Tests Ordered: Current medicines are reviewed at length with the patient today.  Concerns regarding medicines are outlined above.  Orders Placed This Encounter  Procedures   Basic metabolic panel   CBC   TSH   Hepatic function panel   NMR, lipoprofile   Lipid panel   MYOCARDIAL PERFUSION IMAGING   Meds ordered this encounter  Medications   rosuvastatin (CRESTOR) 20 MG tablet    Sig: Take 1 tablet (20 mg total) by mouth daily.    Dispense:  90 tablet    Refill:  3    Patient Instructions  Medication Instructions:  INCREASE rosuvastatin (Crestor) to 20mg  (1 tablet) daily.   *If you need a refill on your cardiac medications before your next appointment, please call your pharmacy*   Lab Work: BMET, CBC, TSH to be drawn today  Lipid, Lipid NMR, Hepatic panel to be drawn FASTING in 1-2 months.   If you have labs (blood work) drawn today and your tests are completely normal, you will receive your results only by: [place] (if you have MyChart) OR A paper copy in the mail If you have any lab test that is abnormal or we need to change your treatment, we will call you to review the results.   Testing/Procedures: Your physician has requested that you have a lexiscan myoview. For further information please visit [email or web address]. Please follow instruction sheet, as given.    Follow-Up: At [facility], you and your health needs are our priority.  As part of our continuing mission to provide you with exceptional heart care, we have created designated Provider Care Teams.  These Care Teams include your primary Cardiologist (physician) and Advanced Practice Providers (APPs -  Physician Assistants and Nurse Practitioners) who all work together to provide you with the care you need, when you need it.  We recommend signing up for the patient portal  called "MyChart".  Sign up information is provided on this After Visit Summary.  MyChart is used to  connect with patients for Virtual Visits (Telemedicine).  Patients are able to view lab/test results, encounter notes, upcoming appointments, etc.  Non-urgent messages can be sent to your provider as well.   To learn more about what you can do with MyChart, go to [email or web address].    Your next appointment:   12 month(s) -  Per Almyra Deforest, PA, if your lexiscan is normal you will follow up with Dr. Stanford Breed in 1 year, if not our office will see you sooner.   The format for your next appointment:   In Person  Provider:   [name], MD       Signed, Almyra Deforest, [place]  11/09/2020 9:10 PM    [place]

## 2020-11-08 LAB — NMR, LIPOPROFILE
Cholesterol, Total: 156 mg/dL (ref 100–199)
HDL Particle Number: 37.3 umol/L (ref >=30.5)
HDL-C: 52 mg/dL (ref >39)
LDL Particle Number: 922 nmol/L (ref <1000)
LDL Size: 20 nm — ABNORMAL LOW (ref >20.5)
LDL-C (NIH Calc): 64 mg/dL (ref 0–99)
LP-IR Score: 55 — ABNORMAL HIGH (ref <=45)
Small LDL Particle Number: 613 nmol/L — ABNORMAL HIGH (ref <=527)
Triglycerides: 247 mg/dL — ABNORMAL HIGH (ref 0–149)

## 2020-11-09 ENCOUNTER — Telehealth (HOSPITAL_COMMUNITY): Payer: Self-pay | Admitting: *Deleted

## 2020-11-09 ENCOUNTER — Encounter: Payer: Self-pay | Admitting: Physician Assistant

## 2020-11-09 NOTE — Telephone Encounter (Signed)
Close encounter 

## 2020-11-10 ENCOUNTER — Other Ambulatory Visit: Payer: Self-pay

## 2020-11-10 ENCOUNTER — Ambulatory Visit (HOSPITAL_COMMUNITY)
Admission: RE | Admit: 2020-11-10 | Discharge: 2020-11-10 | Disposition: A | Discharge status: Home / Self Care | Payer: Medicare PPO | Source: Ambulatory Visit | Attending: Cardiovascular Disease | Admitting: Cardiovascular Disease

## 2020-11-10 DIAGNOSIS — R0602 Shortness of breath: Secondary | ICD-10-CM | POA: Insufficient documentation

## 2020-11-10 LAB — MYOCARDIAL PERFUSION IMAGING
LV dias vol: 117 mL (ref 62–150)
LV sys vol: 57 mL
Peak HR: 58 {beats}/min
Rest HR: 44 {beats}/min
SDS: 0
SRS: 0
SSS: 0
TID: 1.31

## 2020-11-10 MED ORDER — TECHNETIUM TC 99M TETROFOSMIN IV KIT
10.8000 | PACK | Freq: Once | INTRAVENOUS | Status: AC | PRN
  Administered 2020-11-10: 10.8 via INTRAVENOUS
  Filled 2020-11-10: qty 11

## 2020-11-10 MED ORDER — TECHNETIUM TC 99M TETROFOSMIN IV KIT
31.9000 | PACK | Freq: Once | INTRAVENOUS | Status: AC | PRN
  Administered 2020-11-10: 31.9 via INTRAVENOUS
  Filled 2020-11-10: qty 32

## 2020-11-10 MED ORDER — REGADENOSON 0.4 MG/5ML IV SOLN
0.4000 mg | Freq: Once | INTRAVENOUS | Status: AC
  Administered 2020-11-10: 0.4 mg via INTRAVENOUS

## 2020-11-10 NOTE — Addendum Note (Signed)
Addended by: Merri Ray A on: 11/10/2020 12:01 PM   Modules accepted: Orders

## 2020-11-10 NOTE — Progress Notes (Signed)
Triglyceride still high, LDL borderline controlled with elevated small LDL particle number, recommend increase Crestor to 40mg  daily. Repeat FLP and LFT in 3 month. Awaiting on the nuclear stress test result from today.

## 2020-11-15 NOTE — Progress Notes (Signed)
Subjective:    Patient ID: Samuel Flowers, male    DOB: Jul 21, 1950, 70 y.o.   MRN: [id number]  HPI The patient is here for an acute visit.   Stomach issues -  it started 5-6 days - had stomach pain, had difficulty going to the bathroom, then had loose stools, took natural laxative and then had soft stool.   After eating last week had pain after eating and had to go the the bathroom but couldn't.  Yesterday started feeling better.  Yesterday stool just started to be formed.  Gas is much stronger odor than usual. Feeling like he is getting better, but still has bloating.  No sick contacts, wife not sick.  No change in diet or meds.  Was not taking metformin for a while and just restarted.    Last colonoscopy - 04/2019 - 2 polyps , int hemorrhoids, otherwise normal.    Medications and allergies reviewed with patient and updated if appropriate.  Patient Active Problem List   Diagnosis Date Noted   Arthralgia 03/01/2020   Toe pain, left 03/01/2020   Right hip pain 03/01/2020   Rash 12/16/2019   Prediabetes 02/21/2019   Paronychia of great toe, left 09/07/2018   Gastrocnemius tear, left, sequela 07/29/2018   Onychomycosis 06/16/2018   Bladder retention, self caths 06/16/2018   Pain of left lower extremity 08/07/2017   Right foot pain 08/07/2017   Left foot pain 08/07/2017   Bilateral plantar fasciitis 06/13/2014   Hyperlipidemia LDL goal <70 05/12/2008   Coronary atherosclerosis 05/12/2008   ALLERGIC RHINITIS 05/12/2008   Irritable bowel syndrome 05/12/2008    Current Outpatient Medications on File Prior to Visit  Medication Sig Dispense Refill   aspirin 81 MG EC tablet Take 81 mg by mouth daily.      diphenhydrAMINE (BENADRYL) 25 MG tablet Take 25 mg by mouth every 6 (six) hours as needed.     ibuprofen (ADVIL) 200 MG tablet Take 200 mg by mouth every 6 (six) hours as needed.     loratadine (CLARITIN) 10 MG tablet Take 10 mg by mouth daily.     meloxicam (MOBIC) 15 MG  tablet Take 1 tablet (15 mg total) by mouth daily. 30 tablet 0   metFORMIN (GLUCOPHAGE) 500 MG tablet TAKE 1 TABLET BY MOUTH DAILY WITH BREAKFAST 90 tablet 0   rosuvastatin (CRESTOR) 20 MG tablet Take 1 tablet (20 mg total) by mouth daily. 90 tablet 3   terbinafine (LAMISIL) 250 MG tablet  (Patient not taking: Reported on 11/16/2020)     No current facility-administered medications on file prior to visit.    Past Medical History:  Diagnosis Date   Acute asthmatic bronchitis    Allergy    pollen   BPH (benign prostatic hypertrophy)    mild   C. difficile colitis    CAD (coronary artery disease)    Colitis    Difficulty urinating 2017   self cath for all urination   Fatty liver    Hyperlipemia    Hypothyroidism    IBS (irritable bowel syndrome)    Nonspecific colitis    Rhinitis, allergic     Past Surgical History:  Procedure Laterality Date   CATARACT EXTRACTION, BILATERAL Bilateral 2016   COLONOSCOPY  2010   CORONARY ARTERY BYPASS GRAFT  2002   EYE SURGERY     LUMBAR LAMINECTOMY  1990    Social History   Socioeconomic History   Marital status: Married    Spouse name: Not  on file   Number of children: 2   Years of education: Not on file   Highest education level: Not on file  Occupational History   Occupation: Retired  Tobacco Use   Smoking status: Never   Smokeless tobacco: Never  Vaping Use   Vaping Use: Never used  Substance and Sexual Activity   Alcohol use: Yes    Alcohol/week: 3.0 standard drinks    Types: 3 Standard drinks or equivalent per week    Comment: occasionally   Drug use: No   Sexual activity: Yes  Other Topics Concern   Not on file  Social History Narrative   Not on file   Social Determinants of Health   Financial Resource Strain: Not on file  Food Insecurity: Not on file  Transportation Needs: Not on file  Physical Activity: Not on file  Stress: Not on file  Social Connections: Not on file    Family History  Problem Relation  Age of Onset   Colon polyps Father    Coronary artery disease Father    Heart disease Father    Diabetes Father    Hyperlipidemia Father    Coronary artery disease Mother    Mental illness Mother    Colon cancer Paternal Aunt    Irritable bowel syndrome Neg Hx    Stroke Neg Hx    Alcohol abuse Neg Hx    Cancer Neg Hx    Depression Neg Hx    Early death Neg Hx    Hypertension Neg Hx    Esophageal cancer Neg Hx    Rectal cancer Neg Hx    Stomach cancer Neg Hx     Review of Systems  Constitutional:  Negative for appetite change, chills and fever.  Respiratory:  Negative for shortness of breath.   Cardiovascular:  Negative for chest pain, palpitations and leg swelling.  Gastrointestinal:  Positive for abdominal distention (bloating), abdominal pain (cramping), constipation (feels like he is not going enough) and diarrhea. Negative for blood in stool (no black stool) and nausea.       No gerd  Genitourinary:  Negative for difficulty urinating, dysuria and hematuria.       Bad urine odor one day - self caths  Neurological:  Negative for dizziness, light-headedness and headaches.      Objective:   Vitals:   11/16/20 0750  BP: 128/72  Pulse: (!) 50  Temp: 98 F (36.7 C)  SpO2: 96%   BP Readings from Last 3 Encounters:  11/16/20 128/72  11/07/20 130/79  03/01/20 124/76   Wt Readings from Last 3 Encounters:  11/16/20 171 lb (77.6 kg)  11/10/20 172 lb (78 kg)  11/07/20 172 lb 12.8 oz (78.4 kg)   Body mass index is 26 kg/m.   Physical Exam Constitutional:      General: He is not in acute distress.    Appearance: Normal appearance. He is not ill-appearing.  HENT:     Head: Normocephalic and atraumatic.  Cardiovascular:     Rate and Rhythm: Normal rate and regular rhythm.     Heart sounds: No murmur heard. Pulmonary:     Effort: Pulmonary effort is normal. No respiratory distress.     Breath sounds: No wheezing or rales.  Abdominal:     General: There is no  distension.     Palpations: Abdomen is soft.     Tenderness: There is no abdominal tenderness. There is no guarding or rebound.  Musculoskeletal:  Right lower leg: No edema.     Left lower leg: No edema.  Skin:    General: Skin is warm and dry.  Neurological:     Mental Status: He is alert.           Assessment & Plan:    See Problem List for Assessment and Plan of chronic medical problems.    This visit occurred during the SARS-CoV-2 public health emergency.  Safety protocols were in place, including screening questions prior to the visit, additional usage of staff PPE, and extensive cleaning of exam room while observing appropriate contact time as indicated for disinfecting solutions.

## 2020-11-16 ENCOUNTER — Telehealth: Payer: Self-pay

## 2020-11-16 ENCOUNTER — Encounter: Payer: Self-pay | Admitting: Internal Medicine

## 2020-11-16 ENCOUNTER — Ambulatory Visit: Payer: Medicare PPO | Admitting: Internal Medicine

## 2020-11-16 ENCOUNTER — Other Ambulatory Visit: Payer: Self-pay

## 2020-11-16 VITALS — BP 128/72 | HR 50 | Temp 98.0°F | Ht 68.0 in | Wt 171.0 lb

## 2020-11-16 DIAGNOSIS — R7303 Prediabetes: Secondary | ICD-10-CM

## 2020-11-16 DIAGNOSIS — R8290 Unspecified abnormal findings in urine: Secondary | ICD-10-CM | POA: Insufficient documentation

## 2020-11-16 DIAGNOSIS — Z23 Encounter for immunization: Secondary | ICD-10-CM

## 2020-11-16 DIAGNOSIS — R195 Other fecal abnormalities: Secondary | ICD-10-CM

## 2020-11-16 LAB — CBC WITH DIFFERENTIAL/PLATELET
Basophils Absolute: 0 10*3/uL (ref 0.0–0.1)
Basophils Relative: 0.8 % (ref 0.0–3.0)
Eosinophils Absolute: 0.2 10*3/uL (ref 0.0–0.7)
Eosinophils Relative: 4.7 % (ref 0.0–5.0)
HCT: 43.4 % (ref 39.0–52.0)
Hemoglobin: 14.9 g/dL (ref 13.0–17.0)
Lymphocytes Relative: 28.7 % (ref 12.0–46.0)
Lymphs Abs: 1.4 10*3/uL (ref 0.7–4.0)
MCHC: 34.4 g/dL (ref 30.0–36.0)
MCV: 94.9 fl (ref 78.0–100.0)
Monocytes Absolute: 0.6 10*3/uL (ref 0.1–1.0)
Monocytes Relative: 12.1 % — ABNORMAL HIGH (ref 3.0–12.0)
Neutro Abs: 2.7 10*3/uL (ref 1.4–7.7)
Neutrophils Relative %: 53.7 % (ref 43.0–77.0)
Platelets: 176 10*3/uL (ref 150.0–400.0)
RBC: 4.57 Mil/uL (ref 4.22–5.81)
RDW: 13 % (ref 11.5–15.5)
WBC: 5 10*3/uL (ref 4.0–10.5)

## 2020-11-16 LAB — COMPREHENSIVE METABOLIC PANEL
ALT: 21 U/L (ref 0–53)
AST: 22 U/L (ref 0–37)
Albumin: 4.2 g/dL (ref 3.5–5.2)
Alkaline Phosphatase: 53 U/L (ref 39–117)
BUN: 14 mg/dL (ref 6–23)
CO2: 31 mEq/L (ref 19–32)
Calcium: 9.4 mg/dL (ref 8.4–10.5)
Chloride: 101 mEq/L (ref 96–112)
Creatinine, Ser: 0.93 mg/dL (ref 0.40–1.50)
GFR: 83.93 mL/min (ref >60.00)
Glucose, Bld: 102 mg/dL — ABNORMAL HIGH (ref 70–99)
Potassium: 4.5 mEq/L (ref 3.5–5.1)
Sodium: 137 mEq/L (ref 135–145)
Total Bilirubin: 0.4 mg/dL (ref 0.2–1.2)
Total Protein: 7 g/dL (ref 6.0–8.3)

## 2020-11-16 LAB — URINALYSIS, ROUTINE W REFLEX MICROSCOPIC
Bilirubin Urine: NEGATIVE
Hgb urine dipstick: NEGATIVE
Ketones, ur: NEGATIVE
Leukocytes,Ua: NEGATIVE
Nitrite: NEGATIVE
RBC / HPF: NONE SEEN (ref 0–?)
Specific Gravity, Urine: 1.015 (ref 1.000–1.030)
Total Protein, Urine: NEGATIVE
Urine Glucose: NEGATIVE
Urobilinogen, UA: 0.2 (ref 0.0–1.0)
WBC, UA: NONE SEEN (ref 0–?)
pH: 6 (ref 5.0–8.0)

## 2020-11-16 LAB — HEMOGLOBIN A1C: Hgb A1c MFr Bld: 5.9 % (ref 4.6–6.5)

## 2020-11-16 NOTE — Addendum Note (Signed)
Addended by: Marcina Millard on: 11/16/2020 08:45 AM   Modules accepted: Orders

## 2020-11-16 NOTE — Assessment & Plan Note (Signed)
Acute Has to self cath UA, Ucx

## 2020-11-16 NOTE — Patient Instructions (Addendum)
   Pneumonia vaccine given   Blood work was ordered.     Medications changes include :   none   You can try a fiber supplement or probiotics.     Please followup in 1 year

## 2020-11-16 NOTE — Progress Notes (Signed)
Please arrange an earlier visit to discuss nuclear stress test result.

## 2020-11-16 NOTE — Assessment & Plan Note (Signed)
Acute Has some difficulty going then loose stools, some abd cramping, bloating and gas.  Did take natural laxative Possible all starting with constipation, then loose stools Symptoms improving on their own and likely will continue to improve Will check cbc, cmp, Ua, Ucx  Can do nothing or try fiber supp and probiotic which may help system get back to normal.  Call if symptoms do not continue to improve/return to normal

## 2020-11-16 NOTE — Assessment & Plan Note (Signed)
Chronic Taking meformin 500 mg daily  Low sugar/carb diet Check a1c

## 2020-11-16 NOTE — Telephone Encounter (Addendum)
Left a voice message for the patient to give office a call for results and medication change.    ----- Message from [place], [place] sent at 11/10/2020  2:28 PM EDT ----- Triglyceride still high, LDL borderline controlled with elevated small LDL particle number, recommend increase Crestor to 40mg  daily. Repeat FLP and LFT in 3 month. Awaiting on the nuclear stress test result from today.

## 2020-11-16 NOTE — Addendum Note (Signed)
Addended by: Jacobo Forest on: 11/16/2020 08:30 AM   Modules accepted: Orders

## 2020-11-16 NOTE — Progress Notes (Signed)
Dr. Stanford Breed to review, still low risk nuclear stress test, appears to have the same inferior small ischemia as seen in last year's myoview. Patient has been noticing some increased dyspnea with exertion lately.

## 2020-11-17 ENCOUNTER — Telehealth: Payer: Self-pay | Admitting: Cardiology

## 2020-11-17 ENCOUNTER — Other Ambulatory Visit: Payer: Self-pay

## 2020-11-17 DIAGNOSIS — E785 Hyperlipidemia, unspecified: Secondary | ICD-10-CM

## 2020-11-17 LAB — URINE CULTURE: Result:: NO GROWTH

## 2020-11-17 MED ORDER — ROSUVASTATIN CALCIUM 40 MG PO TABS: 40.0000 mg | ORAL_TABLET | Freq: Every day | ORAL | 3 refills | Status: DC

## 2020-11-17 NOTE — Telephone Encounter (Signed)
Spoke to patient Dr.Crenshaw's advice given. 

## 2020-11-17 NOTE — Telephone Encounter (Signed)
Spoke to patient he stated he has appointment with Almyra Deforest PA this Mon 7/11 to discuss myoview results.Stated he has a 3 week trip planned to go to [place].He has to cancel by today or he will lose his money.He wanted to ask Dr.Crenshaw if ok to go on trip and reschedule appointment when he gets back.Stated he is doing ok.He has occasional sob and chest pain.He gets sob when going up a incline.He walks 6 miles everyday with no sob.Advised I will send message to [facility] for advice.

## 2020-11-17 NOTE — Telephone Encounter (Signed)
New Message:     Pt says he needs to talk to the nurse asap please. He have a trip scheduled in 2 weeks and will need to cx today if he needs to. He wants to know if he needs to cx his trip, he had a test on 11-10-20

## 2020-11-20 ENCOUNTER — Ambulatory Visit: Payer: Medicare PPO | Admitting: Physician Assistant

## 2020-11-20 ENCOUNTER — Other Ambulatory Visit: Payer: Self-pay

## 2020-11-20 ENCOUNTER — Encounter: Payer: Self-pay | Admitting: Physician Assistant

## 2020-11-20 VITALS — BP 140/7 | HR 47 | Ht 68.0 in | Wt 171.6 lb

## 2020-11-20 DIAGNOSIS — I25810 Atherosclerosis of coronary artery bypass graft(s) without angina pectoris: Secondary | ICD-10-CM | POA: Diagnosis not present

## 2020-11-20 DIAGNOSIS — E785 Hyperlipidemia, unspecified: Secondary | ICD-10-CM | POA: Diagnosis not present

## 2020-11-20 DIAGNOSIS — R0600 Dyspnea, unspecified: Secondary | ICD-10-CM | POA: Diagnosis not present

## 2020-11-20 DIAGNOSIS — R0609 Other forms of dyspnea: Secondary | ICD-10-CM

## 2020-11-20 DIAGNOSIS — E039 Hypothyroidism, unspecified: Secondary | ICD-10-CM

## 2020-11-20 MED ORDER — ISOSORBIDE MONONITRATE ER 30 MG PO TB24: 15.0000 mg | ORAL_TABLET | Freq: Every day | ORAL | 3 refills | Status: DC

## 2020-11-20 NOTE — Patient Instructions (Addendum)
Medication Instructions:  START Imdur 15 mg daily   *If you need a refill on your cardiac medications before your next appointment, please call your pharmacy*  Lab Work: NONE ordered at this time of appointment   If you have labs (blood work) drawn today and your tests are completely normal, you will receive your results only by: [place] (if you have MyChart) OR A paper copy in the mail If you have any lab test that is abnormal or we need to change your treatment, we will call you to review the results.  Testing/Procedures: NONE ordered at this time of appointment   Follow-Up: At [facility], you and your health needs are our priority.  As part of our continuing mission to provide you with exceptional heart care, we have created designated Provider Care Teams.  These Care Teams include your primary Cardiologist (physician) and Advanced Practice Providers (APPs -  Physician Assistants and Nurse Practitioners) who all work together to provide you with the care you need, when you need it.  Your next appointment:   1 month(s) 01/01/21 at 11:30 AM  The format for your next appointment:   In Person  Provider:   [name], MD  Other Instructions

## 2020-11-20 NOTE — Progress Notes (Signed)
Cardiology Office Note:    Date:  11/22/2020   ID:  Samuel Flowers, DOB 11/17/1950, MRN [id number]  PCP:  Binnie Rail, MD   [facility] Providers Cardiologist:  [name], MD     Referring MD: Binnie Rail, MD   Chief Complaint  Patient presents with   Follow-up    Seen for Dr. Stanford Breed    History of Present Illness:    Samuel Flowers is a 70 y.o. male with a hx of CAD s/p CABG 2002, hyperlipidemia and hypothyroidism.  Cardiac catheterization in 2003 revealed patent grafts, there was a stenosis in the posterolateral branch that was not grafted.  Myoview in November 2017 showed EF 62%, probable diaphragmatic attenuation, mild inferior ischemia could not be excluded.  Patient was last seen by Dr. Stanford Breed on 09/30/2019 at which time he was doing well.  Myoview obtained in July 2021 was low risk was small in inferolateral defect.  Last lipid panel obtained in December 2021 showed uncontrolled LDL, he was instructed to increase Crestor to 20 mg daily.  However patient never increased to Crestor as he was previously not that compliant with Crestor anyway, he wished to try the 10 mg Crestor first and repeat some blood work.  I last saw the patient on 11/07/2020 for evaluation of dyspnea on exertion.  He did have some atypical chest pain that has been going on for the past 2 years however this occurs both at rest and with exertion.  He generally walks about 6 miles per day.  He has baseline asymptomatic bradycardia.  I recommend a Myoview which came back showing low risk study was the same inferior small ischemia as seen on the previous Myoview.  This has been reviewed with Dr. Stanford Breed who recommended medical management if he does not have significant chest discomfort.  During the last visit, I offered increase his Crestor to 20 mg daily.  Patient presents today along with his wife for further evaluation and a discussion of the recent Myoview result.  He has been having intermittent chest  pain not related to exertion for years.  This has not changed recently.  He says his dyspnea on exertion is more noticeable compared to several years ago however he is not sure if this was a more recent change.  He denies any dizziness, blurred vision feeling of passing out despite having slow heart rate.  He says he always have some degree of bradycardia.  I recommended addition of low-dose Imdur 15 mg daily to his medical regimen.  I decided to keep him on the Crestor 20 mg daily and repeat fasting lipid panel and LFT in 6 to 8 weeks.  Otherwise, he can follow-up in 6 to 8 weeks for further evaluation.  If symptom does not improve, may need to consider cardiac catheterization for definitive evaluation.  Past Medical History:  Diagnosis Date   Acute asthmatic bronchitis    Allergy    pollen   BPH (benign prostatic hypertrophy)    mild   C. difficile colitis    CAD (coronary artery disease)    Colitis    Difficulty urinating 2017   self cath for all urination   Fatty liver    Hyperlipemia    Hypothyroidism    IBS (irritable bowel syndrome)    Nonspecific colitis    Rhinitis, allergic     Past Surgical History:  Procedure Laterality Date   CATARACT EXTRACTION, BILATERAL Bilateral 2016   COLONOSCOPY  2010   CORONARY  ARTERY BYPASS GRAFT  2002   EYE SURGERY     LUMBAR LAMINECTOMY  1990    Current Medications: Current Meds  Medication Sig   aspirin 81 MG EC tablet Take 81 mg by mouth daily.    diphenhydrAMINE (BENADRYL) 25 MG tablet Take 25 mg by mouth every 6 (six) hours as needed.   ibuprofen (ADVIL) 200 MG tablet Take 200 mg by mouth every 6 (six) hours as needed.   isosorbide mononitrate (IMDUR) 30 MG 24 hr tablet Take 0.5 tablets (15 mg total) by mouth daily.   loratadine (CLARITIN) 10 MG tablet Take 10 mg by mouth daily.   metFORMIN (GLUCOPHAGE) 500 MG tablet TAKE 1 TABLET BY MOUTH DAILY WITH BREAKFAST   rosuvastatin (CRESTOR) 40 MG tablet Take 1 tablet (40 mg total) by mouth  daily.     Allergies:   Patient has no known allergies.   Social History   Socioeconomic History   Marital status: Married    Spouse name: Not on file   Number of children: 2   Years of education: Not on file   Highest education level: Not on file  Occupational History   Occupation: Retired  Tobacco Use   Smoking status: Never   Smokeless tobacco: Never  Vaping Use   Vaping Use: Never used  Substance and Sexual Activity   Alcohol use: Yes    Alcohol/week: 3.0 standard drinks    Types: 3 Standard drinks or equivalent per week    Comment: occasionally   Drug use: No   Sexual activity: Yes  Other Topics Concern   Not on file  Social History Narrative   Not on file   Social Determinants of Health   Financial Resource Strain: Not on file  Food Insecurity: Not on file  Transportation Needs: Not on file  Physical Activity: Not on file  Stress: Not on file  Social Connections: Not on file     Family History: The patient's family history includes Colon cancer in his paternal aunt; Colon polyps in his father; Coronary artery disease in his father and mother; Diabetes in his father; Heart disease in his father; Hyperlipidemia in his father; Mental illness in his mother. There is no history of Irritable bowel syndrome, Stroke, Alcohol abuse, Cancer, Depression, Early death, Hypertension, Esophageal cancer, Rectal cancer, or Stomach cancer.  ROS:   Please see the history of present illness.     All other systems reviewed and are negative.  EKGs/Labs/Other Studies Reviewed:    The following studies were reviewed today:  Myoview 11/10/2020 The left ventricular ejection fraction is mildly decreased (45-54%). Nuclear stress EF: 51%. There was no ST segment deviation noted during stress. No T wave inversion was noted during stress. The study is normal.   1. There is a small (6 to 8% of LV), mild-moderate, reversible perfusion defect present in the mid to apical inferior  segments consistent with ischemia. 2. TID ratio 1.31 which is upper limits of normal for Lexiscan SPECT. 3. LVEF normal for this modality, 51%. 4. Basal inferolateral ischemia was described on the study from 11/11/2019 and this is noted to be different. 5. Overall, this is likely a low-risk study given the small defect and TID ratio that is within limits.  EKG:  EKG is not ordered today.   Recent Labs: 11/16/2020: ALT 21; BUN 14; Creatinine, Ser 0.93; Hemoglobin 14.9; Platelets 176.0; Potassium 4.5; Sodium 137  Recent Lipid Panel    Component Value Date/Time   CHOL 155 11/07/2020  1135   TRIG 234 (H) 11/07/2020 1135   HDL 49 11/07/2020 1135   CHOLHDL 3.2 11/07/2020 1135   CHOLHDL 3 05/03/2020 0901   VLDL 30.2 05/03/2020 0901   LDLCALC 68 11/07/2020 1135   LDLDIRECT 143.9 03/09/2013 0913     Risk Assessment/Calculations:           Physical Exam:    VS:  BP (!) 140/7   Pulse (!) 47   Ht 5\' 8"  (1.727 m)   Wt 171 lb 9.6 oz (77.8 kg)   SpO2 97%   BMI 26.09 kg/m     Wt Readings from Last 3 Encounters:  11/20/20 171 lb 9.6 oz (77.8 kg)  11/16/20 171 lb (77.6 kg)  11/10/20 172 lb (78 kg)     GEN:  Well nourished, well developed in no acute distress HEENT: Normal NECK: No JVD; No carotid bruits LYMPHATICS: No lymphadenopathy CARDIAC: RRR, no murmurs, rubs, gallops RESPIRATORY:  Clear to auscultation without rales, wheezing or rhonchi  ABDOMEN: Soft, non-tender, non-distended MUSCULOSKELETAL:  No edema; No deformity  SKIN: Warm and dry NEUROLOGIC:  Alert and oriented x 3 PSYCHIATRIC:  Normal affect   ASSESSMENT:    1. DOE (dyspnea on exertion)   2. Coronary artery disease involving coronary bypass graft of native heart without angina pectoris   3. Hyperlipidemia LDL goal <70   4. Hypothyroidism, unspecified type    PLAN:    In order of problems listed above:  Dyspnea on exertion: Recently underwent Myoview.  Nuclear stress test is unchanged when compared to the  previous Myoview in July 2021.  Add low-dose Imdur 15 mg daily.  CAD s/p CABG: He has chronic chest pain not related to exertion.  Hyperlipidemia: He was previously started on Crestor 20 mg daily.  Obtain fasting lipid panel and LFT in 6 to 8 weeks  Hypothyroidism: Managed by primary care provider.        Medication Adjustments/Labs and Tests Ordered: Current medicines are reviewed at length with the patient today.  Concerns regarding medicines are outlined above.  No orders of the defined types were placed in this encounter.  Meds ordered this encounter  Medications   isosorbide mononitrate (IMDUR) 30 MG 24 hr tablet    Sig: Take 0.5 tablets (15 mg total) by mouth daily.    Dispense:  45 tablet    Refill:  3    Patient Instructions  Medication Instructions:  START Imdur 15 mg daily   *If you need a refill on your cardiac medications before your next appointment, please call your pharmacy*  Lab Work: NONE ordered at this time of appointment   If you have labs (blood work) drawn today and your tests are completely normal, you will receive your results only by: [place] (if you have MyChart) OR A paper copy in the mail If you have any lab test that is abnormal or we need to change your treatment, we will call you to review the results.  Testing/Procedures: NONE ordered at this time of appointment   Follow-Up: At [facility], you and your health needs are our priority.  As part of our continuing mission to provide you with exceptional heart care, we have created designated Provider Care Teams.  These Care Teams include your primary Cardiologist (physician) and Advanced Practice Providers (APPs -  Physician Assistants and Nurse Practitioners) who all work together to provide you with the care you need, when you need it.  Your next appointment:   1 month(s)  01/01/21 at 11:30 AM  The format for your next appointment:   In Person  Provider:   [name],  MD  Other Instructions    Signed, Almyra Deforest, [place]  11/22/2020 11:45 PM    [place]

## 2020-11-22 ENCOUNTER — Encounter: Payer: Self-pay | Admitting: Physician Assistant

## 2020-12-20 NOTE — Progress Notes (Signed)
HPI: FU coronary disease. Patient had coronary artery bypass and graft in 2002. His last cardiac catheterization in 2003 revealed patent grafts but there was a stenosis in a posterior lateral branch that was not grafted.  Patient seen recently with atypical chest pain and increased dyspnea on exertion.  Nuclear study July 2022 showed ejection fraction 51%, mild to moderate ischemia in the mid to apical inferior wall.  Since he was last seen, he has some occasional dyspnea with more vigorous activities.  No orthopnea, PND or pedal edema.  He has occasional pain in his left upper chest for seconds not related to activities.  He has had this intermittently for years.  No other chest pains noted and no exertional chest pain.  Current Outpatient Medications  Medication Sig Dispense Refill   aspirin 81 MG EC tablet Take 81 mg by mouth daily.      diphenhydrAMINE (BENADRYL) 25 MG tablet Take 25 mg by mouth every 6 (six) hours as needed.     ibuprofen (ADVIL) 200 MG tablet Take 200 mg by mouth every 6 (six) hours as needed.     isosorbide mononitrate (IMDUR) 30 MG 24 hr tablet Take 0.5 tablets (15 mg total) by mouth daily. 45 tablet 3   loratadine (CLARITIN) 10 MG tablet Take 10 mg by mouth daily.     meloxicam (MOBIC) 15 MG tablet Take 1 tablet (15 mg total) by mouth daily. (Patient taking differently: Take 15 mg by mouth as needed.) 30 tablet 0   metFORMIN (GLUCOPHAGE) 500 MG tablet TAKE 1 TABLET BY MOUTH DAILY WITH BREAKFAST 90 tablet 0   rosuvastatin (CRESTOR) 40 MG tablet Take 1 tablet (40 mg total) by mouth daily. 90 tablet 3   No current facility-administered medications for this visit.     Past Medical History:  Diagnosis Date   Acute asthmatic bronchitis    Allergy    pollen   BPH (benign prostatic hypertrophy)    mild   C. difficile colitis    CAD (coronary artery disease)    Colitis    Difficulty urinating 2017   self cath for all urination   Fatty liver    Hyperlipemia     Hypothyroidism    IBS (irritable bowel syndrome)    Nonspecific colitis    Rhinitis, allergic     Past Surgical History:  Procedure Laterality Date   CATARACT EXTRACTION, BILATERAL Bilateral 2016   COLONOSCOPY  2010   CORONARY ARTERY BYPASS GRAFT  2002   EYE SURGERY     LUMBAR LAMINECTOMY  1990    Social History   Socioeconomic History   Marital status: Married    Spouse name: Not on file   Number of children: 2   Years of education: Not on file   Highest education level: Not on file  Occupational History   Occupation: Retired  Tobacco Use   Smoking status: Never   Smokeless tobacco: Never  Vaping Use   Vaping Use: Never used  Substance and Sexual Activity   Alcohol use: Yes    Alcohol/week: 3.0 standard drinks    Types: 3 Standard drinks or equivalent per week    Comment: occasionally   Drug use: No   Sexual activity: Yes  Other Topics Concern   Not on file  Social History Narrative   Not on file   Social Determinants of Health   Financial Resource Strain: Not on file  Food Insecurity: Not on file  Transportation Needs: Not  on file  Physical Activity: Not on file  Stress: Not on file  Social Connections: Not on file  Intimate Partner Violence: Not on file    Family History  Problem Relation Age of Onset   Colon polyps Father    Coronary artery disease Father    Heart disease Father    Diabetes Father    Hyperlipidemia Father    Coronary artery disease Mother    Mental illness Mother    Colon cancer Paternal Aunt    Irritable bowel syndrome Neg Hx    Stroke Neg Hx    Alcohol abuse Neg Hx    Cancer Neg Hx    Depression Neg Hx    Early death Neg Hx    Hypertension Neg Hx    Esophageal cancer Neg Hx    Rectal cancer Neg Hx    Stomach cancer Neg Hx     ROS: no fevers or chills, productive cough, hemoptysis, dysphasia, odynophagia, melena, hematochezia, dysuria, hematuria, rash, seizure activity, orthopnea, PND, pedal edema, claudication.  Remaining systems are negative.  Physical Exam: Well-developed well-nourished in no acute distress.  Skin is warm and dry.  HEENT is normal.  Neck is supple.  Chest is clear to auscultation with normal expansion.  Cardiovascular exam is regular rate and rhythm.  Abdominal exam nontender or distended. No masses palpated. Extremities show no edema. neuro grossly intact  A/P  1 coronary artery disease-most recent nuclear study showed inferior ischemia but was felt to be low risk.  He has had occasional chest pain for years that lasts a few seconds not related to activities.  Chest pain does not sound cardiac.  Continue medical therapy with aspirin and statin.  2 hyperlipidemia-continue statin.  [name], MD

## 2020-12-27 ENCOUNTER — Telehealth: Payer: Self-pay | Admitting: Podiatry

## 2020-12-27 NOTE — Telephone Encounter (Signed)
Pt left message stating he canceled his appt for 8.19 and got a reminder about it and wanted to make sure is was canceled.  I left message for pt that the appt was canceled.

## 2020-12-29 ENCOUNTER — Other Ambulatory Visit: Payer: Medicare PPO

## 2021-01-01 ENCOUNTER — Other Ambulatory Visit: Payer: Self-pay

## 2021-01-01 ENCOUNTER — Encounter: Payer: Self-pay | Admitting: Cardiology

## 2021-01-01 ENCOUNTER — Ambulatory Visit: Payer: Medicare PPO | Admitting: Cardiology

## 2021-01-01 VITALS — BP 130/70 | HR 56 | Ht 68.0 in | Wt 172.0 lb

## 2021-01-01 DIAGNOSIS — R072 Precordial pain: Secondary | ICD-10-CM

## 2021-01-01 DIAGNOSIS — I25810 Atherosclerosis of coronary artery bypass graft(s) without angina pectoris: Secondary | ICD-10-CM

## 2021-01-01 DIAGNOSIS — E785 Hyperlipidemia, unspecified: Secondary | ICD-10-CM

## 2021-01-01 NOTE — Patient Instructions (Signed)

## 2021-01-18 ENCOUNTER — Telehealth: Payer: Self-pay | Admitting: Physician Assistant

## 2021-01-18 MED ORDER — ISOSORBIDE MONONITRATE ER 30 MG PO TB24: 30.0000 mg | ORAL_TABLET | Freq: Every day | ORAL | 3 refills | Status: DC

## 2021-01-18 NOTE — Telephone Encounter (Signed)
Pt c/o medication issue:  1. Name of Medication:  isosorbide mononitrate (IMDUR) 30 MG 24 hr tablet  2. How are you currently taking this medication (dosage and times per day)? Has been taking a full tablet half the time due to it being hard t cut in half  3. Are you having a reaction (difficulty breathing--STAT)? No   4. What is your medication issue? Powell is calling wanting a dosage change to 15 MG's due to the tablets falling apart when trying to cut in in half. Due to this he has been taking a full tablet half the time and is now completely out. He is needing a refill to go to [facility] on [address] for a 90 day supply. He states they advised insurance would not approve it.

## 2021-01-18 NOTE — Telephone Encounter (Signed)
Patient has been made aware. New script sent in  [place], MD  You 14 minutes ago (3:44 PM)   Stay on imdur 30 mg daily  [name]

## 2021-01-18 NOTE — Telephone Encounter (Signed)
Spoke with the patient about his Imdur 15 mg. He stated that he has been trying to cut the medication in half but the pill disintegrates so he has been taking the 30 mg once daily.   He has been on 30 mg once daily for two weeks. He is unable to check his blood pressures at home but stated that he has felt fine.  He would like to know if he should stay on the 30 mg or try to cut it again. He will need a refill.

## 2021-03-15 DIAGNOSIS — R339 Retention of urine, unspecified: Secondary | ICD-10-CM | POA: Diagnosis not present

## 2021-04-25 DIAGNOSIS — N401 Enlarged prostate with lower urinary tract symptoms: Secondary | ICD-10-CM | POA: Diagnosis not present

## 2021-04-25 DIAGNOSIS — R3912 Poor urinary stream: Secondary | ICD-10-CM | POA: Diagnosis not present

## 2021-04-25 DIAGNOSIS — N312 Flaccid neuropathic bladder, not elsewhere classified: Secondary | ICD-10-CM | POA: Diagnosis not present

## 2021-04-25 DIAGNOSIS — R3914 Feeling of incomplete bladder emptying: Secondary | ICD-10-CM | POA: Diagnosis not present

## 2021-06-19 ENCOUNTER — Encounter: Payer: Self-pay | Admitting: Internal Medicine

## 2021-06-19 ENCOUNTER — Encounter: Payer: Self-pay | Admitting: Cardiology

## 2021-06-19 DIAGNOSIS — E785 Hyperlipidemia, unspecified: Secondary | ICD-10-CM

## 2021-06-19 DIAGNOSIS — R7303 Prediabetes: Secondary | ICD-10-CM

## 2021-06-19 DIAGNOSIS — I25810 Atherosclerosis of coronary artery bypass graft(s) without angina pectoris: Secondary | ICD-10-CM

## 2021-06-19 DIAGNOSIS — Z125 Encounter for screening for malignant neoplasm of prostate: Secondary | ICD-10-CM

## 2021-06-21 ENCOUNTER — Encounter: Payer: Self-pay | Admitting: Internal Medicine

## 2021-06-26 ENCOUNTER — Other Ambulatory Visit: Payer: Self-pay

## 2021-06-26 ENCOUNTER — Other Ambulatory Visit (INDEPENDENT_AMBULATORY_CARE_PROVIDER_SITE_OTHER): Payer: Medicare PPO

## 2021-06-26 DIAGNOSIS — R7303 Prediabetes: Secondary | ICD-10-CM | POA: Diagnosis not present

## 2021-06-26 DIAGNOSIS — I25810 Atherosclerosis of coronary artery bypass graft(s) without angina pectoris: Secondary | ICD-10-CM

## 2021-06-26 DIAGNOSIS — Z125 Encounter for screening for malignant neoplasm of prostate: Secondary | ICD-10-CM

## 2021-06-26 DIAGNOSIS — E785 Hyperlipidemia, unspecified: Secondary | ICD-10-CM | POA: Diagnosis not present

## 2021-06-26 LAB — CBC WITH DIFFERENTIAL/PLATELET
Basophils Absolute: 0 10*3/uL (ref 0.0–0.1)
Basophils Relative: 0.6 % (ref 0.0–3.0)
Eosinophils Absolute: 0.2 10*3/uL (ref 0.0–0.7)
Eosinophils Relative: 4.2 % (ref 0.0–5.0)
HCT: 42.8 % (ref 39.0–52.0)
Hemoglobin: 14.4 g/dL (ref 13.0–17.0)
Lymphocytes Relative: 32 % (ref 12.0–46.0)
Lymphs Abs: 1.8 10*3/uL (ref 0.7–4.0)
MCHC: 33.6 g/dL (ref 30.0–36.0)
MCV: 94.7 fl (ref 78.0–100.0)
Monocytes Absolute: 0.7 10*3/uL (ref 0.1–1.0)
Monocytes Relative: 12.1 % — ABNORMAL HIGH (ref 3.0–12.0)
Neutro Abs: 2.9 10*3/uL (ref 1.4–7.7)
Neutrophils Relative %: 51.1 % (ref 43.0–77.0)
Platelets: 191 10*3/uL (ref 150.0–400.0)
RBC: 4.52 Mil/uL (ref 4.22–5.81)
RDW: 13.4 % (ref 11.5–15.5)
WBC: 5.7 10*3/uL (ref 4.0–10.5)

## 2021-06-26 LAB — PSA, MEDICARE: PSA: 0.87 ng/ml (ref 0.10–4.00)

## 2021-06-26 LAB — HEMOGLOBIN A1C: Hgb A1c MFr Bld: 6.3 % (ref 4.6–6.5)

## 2021-06-26 LAB — TSH: TSH: 5.88 u[IU]/mL — ABNORMAL HIGH (ref 0.35–5.50)

## 2021-06-27 LAB — LIPID PANEL
Cholesterol: 104 mg/dL (ref 0–200)
HDL: 50.2 mg/dL (ref >39.00)
LDL Cholesterol: 31 mg/dL (ref 0–99)
NonHDL: 53.92
Total CHOL/HDL Ratio: 2
Triglycerides: 113 mg/dL (ref 0.0–149.0)
VLDL: 22.6 mg/dL (ref 0.0–40.0)

## 2021-06-27 LAB — COMPREHENSIVE METABOLIC PANEL
ALT: 17 U/L (ref 0–53)
AST: 19 U/L (ref 0–37)
Albumin: 4.1 g/dL (ref 3.5–5.2)
Alkaline Phosphatase: 51 U/L (ref 39–117)
BUN: 18 mg/dL (ref 6–23)
CO2: 32 mEq/L (ref 19–32)
Calcium: 9.6 mg/dL (ref 8.4–10.5)
Chloride: 104 mEq/L (ref 96–112)
Creatinine, Ser: 0.88 mg/dL (ref 0.40–1.50)
GFR: 87.51 mL/min (ref >60.00)
Glucose, Bld: 97 mg/dL (ref 70–99)
Potassium: 4.4 mEq/L (ref 3.5–5.1)
Sodium: 141 mEq/L (ref 135–145)
Total Bilirubin: 0.3 mg/dL (ref 0.2–1.2)
Total Protein: 6.7 g/dL (ref 6.0–8.3)

## 2021-06-27 NOTE — Progress Notes (Unsigned)
HPI: FU coronary disease. Patient had coronary artery bypass and graft in 2002. His last cardiac catheterization in 2003 revealed patent grafts but there was a stenosis in a posterior lateral branch that was not grafted. Nuclear study July 2022 showed ejection fraction 51%, mild to moderate ischemia in the mid to apical inferior wall.  Treated medically. Since he was last seen,   Current Outpatient Medications  Medication Sig Dispense Refill   aspirin 81 MG EC tablet Take 81 mg by mouth daily.      diphenhydrAMINE (BENADRYL) 25 MG tablet Take 25 mg by mouth every 6 (six) hours as needed.     ibuprofen (ADVIL) 200 MG tablet Take 200 mg by mouth every 6 (six) hours as needed.     isosorbide mononitrate (IMDUR) 30 MG 24 hr tablet Take 1 tablet (30 mg total) by mouth daily. 90 tablet 3   loratadine (CLARITIN) 10 MG tablet Take 10 mg by mouth daily.     meloxicam (MOBIC) 15 MG tablet Take 1 tablet (15 mg total) by mouth daily. (Patient taking differently: Take 15 mg by mouth as needed.) 30 tablet 0   metFORMIN (GLUCOPHAGE) 500 MG tablet TAKE 1 TABLET BY MOUTH DAILY WITH BREAKFAST 90 tablet 0   rosuvastatin (CRESTOR) 40 MG tablet Take 1 tablet (40 mg total) by mouth daily. 90 tablet 3   No current facility-administered medications for this visit.     Past Medical History:  Diagnosis Date   Acute asthmatic bronchitis    Allergy    pollen   BPH (benign prostatic hypertrophy)    mild   C. difficile colitis    CAD (coronary artery disease)    Colitis    Difficulty urinating 2017   self cath for all urination   Fatty liver    Hyperlipemia    Hypothyroidism    IBS (irritable bowel syndrome)    Nonspecific colitis    Rhinitis, allergic     Past Surgical History:  Procedure Laterality Date   CATARACT EXTRACTION, BILATERAL Bilateral 2016   COLONOSCOPY  2010   CORONARY ARTERY BYPASS GRAFT  2002   EYE SURGERY     LUMBAR LAMINECTOMY  1990    Social History   Socioeconomic  History   Marital status: Married    Spouse name: Not on file   Number of children: 2   Years of education: Not on file   Highest education level: Not on file  Occupational History   Occupation: Retired  Tobacco Use   Smoking status: Never   Smokeless tobacco: Never  Vaping Use   Vaping Use: Never used  Substance and Sexual Activity   Alcohol use: Yes    Alcohol/week: 3.0 standard drinks    Types: 3 Standard drinks or equivalent per week    Comment: occasionally   Drug use: No   Sexual activity: Yes  Other Topics Concern   Not on file  Social History Narrative   Not on file   Social Determinants of Health   Financial Resource Strain: Not on file  Food Insecurity: Not on file  Transportation Needs: Not on file  Physical Activity: Not on file  Stress: Not on file  Social Connections: Not on file  Intimate Partner Violence: Not on file    Family History  Problem Relation Age of Onset   Colon polyps Father    Coronary artery disease Father    Heart disease Father    Diabetes Father  Hyperlipidemia Father    Coronary artery disease Mother    Mental illness Mother    Colon cancer Paternal Aunt    Irritable bowel syndrome Neg Hx    Stroke Neg Hx    Alcohol abuse Neg Hx    Cancer Neg Hx    Depression Neg Hx    Early death Neg Hx    Hypertension Neg Hx    Esophageal cancer Neg Hx    Rectal cancer Neg Hx    Stomach cancer Neg Hx     ROS: no fevers or chills, productive cough, hemoptysis, dysphasia, odynophagia, melena, hematochezia, dysuria, hematuria, rash, seizure activity, orthopnea, PND, pedal edema, claudication. Remaining systems are negative.  Physical Exam: Well-developed well-nourished in no acute distress.  Skin is warm and dry.  HEENT is normal.  Neck is supple.  Chest is clear to auscultation with normal expansion.  Cardiovascular exam is regular rate and rhythm.  Abdominal exam nontender or distended. No masses palpated. Extremities show no  edema. neuro grossly intact  ECG- personally reviewed  A/P  1 coronary artery disease-as outlined above most recent nuclear study did show some inferior ischemia but overall felt to be low risk.  He is not having any new chest pain.  We will continue with medical therapy including aspirin and statin.  2 hyperlipidemia-continue statin.  [name], MD

## 2021-06-28 ENCOUNTER — Encounter: Payer: Self-pay | Admitting: Internal Medicine

## 2021-06-28 DIAGNOSIS — E039 Hypothyroidism, unspecified: Secondary | ICD-10-CM | POA: Insufficient documentation

## 2021-06-28 DIAGNOSIS — E038 Other specified hypothyroidism: Secondary | ICD-10-CM | POA: Insufficient documentation

## 2021-06-28 NOTE — Patient Instructions (Addendum)
Medications changes include :   levothyroxiine 25 mcg daily and increase the metformin 500 mg to twice daily   Your prescription(s) have been sent to your pharmacy.    Return in about 1 year (around 06/29/2022) for CPe, schedule labs - non fasting in 2 months.   Health Maintenance, Male Adopting a healthy lifestyle and getting preventive care are important in promoting health and wellness. Ask your health care provider about: The right schedule for you to have regular tests and exams. Things you can do on your own to prevent diseases and keep yourself healthy. What should I know about diet, weight, and exercise? Eat a healthy diet  Eat a diet that includes plenty of vegetables, fruits, low-fat dairy products, and lean protein. Do not eat a lot of foods that are high in solid fats, added sugars, or sodium. Maintain a healthy weight Body mass index (BMI) is a measurement that can be used to identify possible weight problems. It estimates body fat based on height and weight. Your health care provider can help determine your BMI and help you achieve or maintain a healthy weight. Get regular exercise Get regular exercise. This is one of the most important things you can do for your health. Most adults should: Exercise for at least 150 minutes each week. The exercise should increase your heart rate and make you sweat (moderate-intensity exercise). Do strengthening exercises at least twice a week. This is in addition to the moderate-intensity exercise. Spend less time sitting. Even light physical activity can be beneficial. Watch cholesterol and blood lipids Have your blood tested for lipids and cholesterol at 70 years of age, then have this test every 5 years. You may need to have your cholesterol levels checked more often if: Your lipid or cholesterol levels are high. You are older than 70 years of age. You are at high risk for heart disease. What should I know about cancer  screening? Many types of cancers can be detected early and may often be prevented. Depending on your health history and family history, you may need to have cancer screening at various ages. This may include screening for: Colorectal cancer. Prostate cancer. Skin cancer. Lung cancer. What should I know about heart disease, diabetes, and high blood pressure? Blood pressure and heart disease High blood pressure causes heart disease and increases the risk of stroke. This is more likely to develop in people who have high blood pressure readings or are overweight. Talk with your health care provider about your target blood pressure readings. Have your blood pressure checked: Every 3-5 years if you are 82-66 years of age. Every year if you are 68 years old or older. If you are between the ages of 60 and 34 and are a current or former smoker, ask your health care provider if you should have a one-time screening for abdominal aortic aneurysm (AAA). Diabetes Have regular diabetes screenings. This checks your fasting blood sugar level. Have the screening done: Once every three years after age 49 if you are at a normal weight and have a low risk for diabetes. More often and at a younger age if you are overweight or have a high risk for diabetes. What should I know about preventing infection? Hepatitis B If you have a higher risk for hepatitis B, you should be screened for this virus. Talk with your health care provider to find out if you are at risk for hepatitis B infection. Hepatitis C Blood testing is recommended for:  Everyone born from 1945 through 1965. °Anyone with known risk factors for hepatitis C. °Sexually transmitted infections (STIs) °You should be screened each year for STIs, including gonorrhea and chlamydia, if: °You are sexually active and are younger than 70 years of age. °You are older than 70 years of age and your health care provider tells you that you are at risk for this type of  infection. °Your sexual activity has changed since you were last screened, and you are at increased risk for chlamydia or gonorrhea. Ask your health care provider if you are at risk. °Ask your health care provider about whether you are at high risk for HIV. Your health care provider may recommend a prescription medicine to help prevent HIV infection. If you choose to take medicine to prevent HIV, you should first get tested for HIV. You should then be tested every 3 months for as long as you are taking the medicine. °Follow these instructions at home: °Alcohol use °Do not drink alcohol if your health care provider tells you not to drink. °If you drink alcohol: °Limit how much you have to 0-2 drinks a day. °Know how much alcohol is in your drink. In the [place] one drink equals one 12 oz bottle of beer (355 mL), one 5 oz glass of wine (148 mL), or one 1½ oz glass of hard liquor (44 mL). °Lifestyle °Do not use any products that contain nicotine or tobacco. These products include cigarettes, chewing tobacco, and vaping devices, such as e-cigarettes. If you need help quitting, ask your health care provider. °Do not use street drugs. °Do not share needles. °Ask your health care provider for help if you need support or information about quitting drugs. °General instructions °Schedule regular health, dental, and eye exams. °Stay current with your vaccines. °Tell your health care provider if: °You often feel depressed. °You have ever been abused or do not feel safe at home. °Summary °Adopting a healthy lifestyle and getting preventive care are important in promoting health and wellness. °Follow your health care provider's instructions about healthy diet, exercising, and getting tested or screened for diseases. °Follow your health care provider's instructions on monitoring your cholesterol and blood pressure. °This information is not intended to replace advice given to you by your health care provider. Make sure you discuss  any questions you have with your health care provider. °Document Revised: 09/18/2020 Document Reviewed: 09/18/2020 °Elsevier Patient Education © 2022 [employer] ° °

## 2021-06-28 NOTE — Progress Notes (Deleted)
Subjective:    Patient ID: Vasiliy Mccarry, male    DOB: [birth year], 71 y.o.   MRN: [id number]  This visit occurred during the SARS-CoV-2 public health emergency.  Safety protocols were in place, including screening questions prior to the visit, additional usage of staff PPE, and extensive cleaning of exam room while observing appropriate contact time as indicated for disinfecting solutions.     HPI The patient is here for follow up of their chronic medical problems, including     Medications and allergies reviewed with patient and updated if appropriate.  Patient Active Problem List   Diagnosis Date Noted   Change in stool 11/16/2020   Abnormal urine odor 11/16/2020   Arthralgia 03/01/2020   Toe pain, left 03/01/2020   Right hip pain 03/01/2020   Rash 12/16/2019   Prediabetes 02/21/2019   Gastrocnemius tear, left, sequela 07/29/2018   Onychomycosis 06/16/2018   Bladder retention, self caths 06/16/2018   Pain of left lower extremity 08/07/2017   Right foot pain 08/07/2017   Left foot pain 08/07/2017   Bilateral plantar fasciitis 06/13/2014   Hyperlipidemia LDL goal <70 05/12/2008   Coronary atherosclerosis 05/12/2008   ALLERGIC RHINITIS 05/12/2008   Irritable bowel syndrome 05/12/2008    Current Outpatient Medications on File Prior to Visit  Medication Sig Dispense Refill   aspirin 81 MG EC tablet Take 81 mg by mouth daily.      diphenhydrAMINE (BENADRYL) 25 MG tablet Take 25 mg by mouth every 6 (six) hours as needed.     ibuprofen (ADVIL) 200 MG tablet Take 200 mg by mouth every 6 (six) hours as needed.     isosorbide mononitrate (IMDUR) 30 MG 24 hr tablet Take 1 tablet (30 mg total) by mouth daily. 90 tablet 3   loratadine (CLARITIN) 10 MG tablet Take 10 mg by mouth daily.     meloxicam (MOBIC) 15 MG tablet Take 1 tablet (15 mg total) by mouth daily. (Patient taking differently: Take 15 mg by mouth as needed.) 30 tablet 0   metFORMIN (GLUCOPHAGE) 500 MG tablet TAKE  1 TABLET BY MOUTH DAILY WITH BREAKFAST 90 tablet 0   rosuvastatin (CRESTOR) 40 MG tablet Take 1 tablet (40 mg total) by mouth daily. 90 tablet 3   No current facility-administered medications on file prior to visit.    Past Medical History:  Diagnosis Date   Acute asthmatic bronchitis    Allergy    pollen   BPH (benign prostatic hypertrophy)    mild   C. difficile colitis    CAD (coronary artery disease)    Colitis    Difficulty urinating 2017   self cath for all urination   Fatty liver    Hyperlipemia    Hypothyroidism    IBS (irritable bowel syndrome)    Nonspecific colitis    Rhinitis, allergic     Past Surgical History:  Procedure Laterality Date   CATARACT EXTRACTION, BILATERAL Bilateral 2016   COLONOSCOPY  2010   CORONARY ARTERY BYPASS GRAFT  2002   EYE SURGERY     LUMBAR LAMINECTOMY  1990    Social History   Socioeconomic History   Marital status: Married    Spouse name: Not on file   Number of children: 2   Years of education: Not on file   Highest education level: Not on file  Occupational History   Occupation: Retired  Tobacco Use   Smoking status: Never   Smokeless tobacco: Never  Vaping Use  Vaping Use: Never used  Substance and Sexual Activity   Alcohol use: Yes    Alcohol/week: 3.0 standard drinks    Types: 3 Standard drinks or equivalent per week    Comment: occasionally   Drug use: No   Sexual activity: Yes  Other Topics Concern   Not on file  Social History Narrative   Not on file   Social Determinants of Health   Financial Resource Strain: Not on file  Food Insecurity: Not on file  Transportation Needs: Not on file  Physical Activity: Not on file  Stress: Not on file  Social Connections: Not on file    Family History  Problem Relation Age of Onset   Colon polyps Father    Coronary artery disease Father    Heart disease Father    Diabetes Father    Hyperlipidemia Father    Coronary artery disease Mother    Mental  illness Mother    Colon cancer Paternal Aunt    Irritable bowel syndrome Neg Hx    Stroke Neg Hx    Alcohol abuse Neg Hx    Cancer Neg Hx    Depression Neg Hx    Early death Neg Hx    Hypertension Neg Hx    Esophageal cancer Neg Hx    Rectal cancer Neg Hx    Stomach cancer Neg Hx     Review of Systems     Objective:  There were no vitals filed for this visit. BP Readings from Last 3 Encounters:  01/01/21 130/70  11/20/20 (!) 140/7  11/16/20 128/72   Wt Readings from Last 3 Encounters:  01/01/21 172 lb (78 kg)  11/20/20 171 lb 9.6 oz (77.8 kg)  11/16/20 171 lb (77.6 kg)   There is no height or weight on file to calculate BMI.   Physical Exam    Constitutional: Appears well-developed and well-nourished. No distress.  HENT:  Head: Normocephalic and atraumatic.  Neck: Neck supple. No tracheal deviation present. No thyromegaly present.  No cervical lymphadenopathy Cardiovaslar: Normal rate, regular rhythm and normal heart sounds.   No murmur heard. No carotid bruit .  No edema Pulmonary/Chest: Effort normal and breath sounds normal. No respiratory distress. No has no wheezes. No rales.  Skin: Skin is warm and dry. Not diaphoretic.  Psychiatric: Normal mood and affect. Behavior is normal.      Assessment & Plan:    See Problem List for Assessment and Plan of chronic medical problems.    he

## 2021-06-28 NOTE — Progress Notes (Signed)
Subjective:    Patient ID: Samuel Flowers, male    DOB: [birth year], 71 y.o.   MRN: [id number]   This visit occurred during the SARS-CoV-2 public health emergency.  Safety protocols were in place, including screening questions prior to the visit, additional usage of staff PPE, and extensive cleaning of exam room while observing appropriate contact time as indicated for disinfecting solutions.   HPI He is here for a physical exam.   He does feel more achy and a little more fatigued.  ? Related to statin.  He feels really achy when he first stands up, but as he moves more he starts to feel better.  He walks regularly and is okay with walking.    Medications and allergies reviewed with patient and updated if appropriate.  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   Change in stool 11/16/2020   Arthralgia 03/01/2020   Rash 12/16/2019   Prediabetes 02/21/2019   Gastrocnemius tear, left, sequela 07/29/2018   Onychomycosis 06/16/2018   Bladder retention, self caths 06/16/2018   Pain of left lower extremity 08/07/2017   Right foot pain 08/07/2017   Left foot pain 08/07/2017   Bilateral plantar fasciitis 06/13/2014   Hyperlipidemia LDL goal <70 05/12/2008   Coronary atherosclerosis 05/12/2008   ALLERGIC RHINITIS 05/12/2008   Irritable bowel syndrome 05/12/2008    Current Outpatient Medications on File Prior to Visit  Medication Sig Dispense Refill   aspirin 81 MG EC tablet Take 81 mg by mouth daily.      diphenhydrAMINE (BENADRYL) 25 MG tablet Take 25 mg by mouth every 6 (six) hours as needed.     ibuprofen (ADVIL) 200 MG tablet Take 200 mg by mouth every 6 (six) hours as needed.     isosorbide mononitrate (IMDUR) 30 MG 24 hr tablet Take 1 tablet (30 mg total) by mouth daily. 90 tablet 3   loratadine (CLARITIN) 10 MG tablet Take 10 mg by mouth daily.     meloxicam (MOBIC) 15 MG tablet Take 1 tablet (15 mg total) by mouth daily.  (Patient taking differently: Take 15 mg by mouth as needed.) 30 tablet 0   rosuvastatin (CRESTOR) 40 MG tablet Take 1 tablet (40 mg total) by mouth daily. 90 tablet 3   No current facility-administered medications on file prior to visit.    Past Medical History:  Diagnosis Date   Acute asthmatic bronchitis    Allergy    pollen   BPH (benign prostatic hypertrophy)    mild   C. difficile colitis    CAD (coronary artery disease)    Colitis    Difficulty urinating 2017   self cath for all urination   Fatty liver    Hyperlipemia    Hypothyroidism    IBS (irritable bowel syndrome)    Nonspecific colitis    Rhinitis, allergic     Past Surgical History:  Procedure Laterality Date   CATARACT EXTRACTION, BILATERAL Bilateral 2016   COLONOSCOPY  2010   CORONARY ARTERY BYPASS GRAFT  2002   EYE SURGERY     LUMBAR LAMINECTOMY  1990    Social History   Socioeconomic History   Marital status: Married    Spouse name: Not on file   Number of children: 2   Years of education: Not on file   Highest education level: Not on file  Occupational History   Occupation: Retired  Tobacco Use   Smoking status: Never   Smokeless  tobacco: Never  Vaping Use   Vaping Use: Never used  Substance and Sexual Activity   Alcohol use: Yes    Alcohol/week: 3.0 standard drinks    Types: 3 Standard drinks or equivalent per week    Comment: occasionally   Drug use: No   Sexual activity: Yes  Other Topics Concern   Not on file  Social History Narrative   Not on file   Social Determinants of Health   Financial Resource Strain: Not on file  Food Insecurity: Not on file  Transportation Needs: Not on file  Physical Activity: Not on file  Stress: Not on file  Social Connections: Not on file    Family History  Problem Relation Age of Onset   Colon polyps Father    Coronary artery disease Father    Heart disease Father    Diabetes Father    Hyperlipidemia Father    Coronary artery disease  Mother    Mental illness Mother    Colon cancer Paternal Aunt    Irritable bowel syndrome Neg Hx    Stroke Neg Hx    Alcohol abuse Neg Hx    Cancer Neg Hx    Depression Neg Hx    Early death Neg Hx    Hypertension Neg Hx    Esophageal cancer Neg Hx    Rectal cancer Neg Hx    Stomach cancer Neg Hx     Review of Systems  Constitutional:  Positive for fatigue (a little more tired). Negative for chills and fever.  Eyes:  Negative for visual disturbance.  Respiratory:  Negative for cough, shortness of breath and wheezing.   Cardiovascular:  Negative for chest pain, palpitations and leg swelling.  Gastrointestinal:  Negative for abdominal pain, blood in stool, constipation, diarrhea and nausea.       No gerd  Genitourinary:  Negative for hematuria.       Self caths  Musculoskeletal:  Positive for arthralgias (knees).  Skin:  Negative for rash.  Neurological:  Positive for light-headedness (occ with changes in position). Negative for headaches.  Psychiatric/Behavioral:  Negative for dysphoric mood. The patient is not nervous/anxious.       Objective:   Vitals:   06/29/21 1443  BP: 130/80  Pulse: (!) 50  Temp: 98.6 F (37 C)  SpO2: 96%   Filed Weights   06/29/21 1443  Weight: 173 lb (78.5 kg)   Body mass index is 26.3 kg/m.  BP Readings from Last 3 Encounters:  06/29/21 130/80  01/01/21 130/70  11/20/20 (!) 140/7    Wt Readings from Last 3 Encounters:  06/29/21 173 lb (78.5 kg)  01/01/21 172 lb (78 kg)  11/20/20 171 lb 9.6 oz (77.8 kg)     Physical Exam Constitutional: He appears well-developed and well-nourished. No distress.  HENT:  Head: Normocephalic and atraumatic.  Right Ear: External ear normal.  Left Ear: External ear normal.  Mouth/Throat: Oropharynx is clear and moist.  Normal ear canals and TM b/l  Eyes: Conjunctivae and EOM are normal.  Neck: Neck supple. No tracheal deviation present. No thyromegaly present.  No carotid bruit  Cardiovascular:  Normal rate, regular rhythm, normal heart sounds and intact distal pulses.   No murmur heard. Pulmonary/Chest: Effort normal and breath sounds normal. No respiratory distress. He has no wheezes. He has no rales.  Abdominal: Soft. He exhibits no distension. There is no tenderness.  Genitourinary: deferred  Musculoskeletal: He exhibits no edema.  Lymphadenopathy:   He has no  cervical adenopathy.  Skin: Skin is warm and dry. He is not diaphoretic.  Psychiatric: He has a normal mood and affect. His behavior is normal.    Lab Results  Component Value Date   WBC 5.7 06/26/2021   HGB 14.4 06/26/2021   HCT 42.8 06/26/2021   PLT 191.0 06/26/2021   GLUCOSE 97 06/26/2021   CHOL 104 06/26/2021   TRIG 113.0 06/26/2021   HDL 50.20 06/26/2021   LDLDIRECT 143.9 03/09/2013   LDLCALC 31 06/26/2021   ALT 17 06/26/2021   AST 19 06/26/2021   NA 141 06/26/2021   K 4.4 06/26/2021   CL 104 06/26/2021   CREATININE 0.88 06/26/2021   BUN 18 06/26/2021   CO2 32 06/26/2021   TSH 5.88 (H) 06/26/2021   PSA 0.87 06/26/2021   HGBA1C 6.3 06/26/2021        Assessment & Plan:   Physical exam: Screening blood work reviewed Exercise   walking a lot Weight   ok Substance abuse   none   Reviewed recommended immunizations.   Health Maintenance  Topic Date Due   COVID-19 Vaccine (3 - Pfizer risk series) 08/09/2019   Zoster Vaccines- Shingrix (1 of 2) 09/26/2021 (Originally [birth year]/1970)   TETANUS/TDAP  10/29/2021   COLONOSCOPY (Pts 45-3yrs Insurance coverage will need to be confirmed)  04/28/2026   Pneumonia Vaccine 81+ Years old  Completed   INFLUENZA VACCINE  Completed   Hepatitis C Screening  Completed   HPV VACCINES  Aged Out     See Problem List for Assessment and Plan of chronic medical problems.

## 2021-06-29 ENCOUNTER — Ambulatory Visit (INDEPENDENT_AMBULATORY_CARE_PROVIDER_SITE_OTHER): Payer: Medicare PPO | Admitting: Internal Medicine

## 2021-06-29 ENCOUNTER — Other Ambulatory Visit: Payer: Self-pay

## 2021-06-29 VITALS — BP 130/80 | HR 50 | Temp 98.6°F | Ht 68.0 in | Wt 173.0 lb

## 2021-06-29 DIAGNOSIS — Z0000 Encounter for general adult medical examination without abnormal findings: Secondary | ICD-10-CM

## 2021-06-29 DIAGNOSIS — I25810 Atherosclerosis of coronary artery bypass graft(s) without angina pectoris: Secondary | ICD-10-CM | POA: Diagnosis not present

## 2021-06-29 DIAGNOSIS — E038 Other specified hypothyroidism: Secondary | ICD-10-CM

## 2021-06-29 DIAGNOSIS — E785 Hyperlipidemia, unspecified: Secondary | ICD-10-CM

## 2021-06-29 DIAGNOSIS — R7303 Prediabetes: Secondary | ICD-10-CM | POA: Diagnosis not present

## 2021-06-29 DIAGNOSIS — R339 Retention of urine, unspecified: Secondary | ICD-10-CM | POA: Diagnosis not present

## 2021-06-29 MED ORDER — LEVOTHYROXINE SODIUM 25 MCG PO TABS: 25.0000 ug | ORAL_TABLET | Freq: Every day | ORAL | 3 refills | Status: DC

## 2021-06-29 MED ORDER — METFORMIN HCL 500 MG PO TABS
500.0000 mg | ORAL_TABLET | Freq: Two times a day (BID) | ORAL | 3 refills | Status: DC
Start: 2021-06-29 — End: 2022-10-31

## 2021-06-29 NOTE — Assessment & Plan Note (Addendum)
Chronic Lab Results  Component Value Date   HGBA1C 6.3 06/26/2021   A1c in prediabetic range-slightly higher than it has been Increase metformin 500 mg to twice daily Stressed diabetic diet which he is not compliant with Continue regular exercise

## 2021-06-29 NOTE — Assessment & Plan Note (Signed)
Chronic Following with cardiology No symptoms suggestive of angina Lipids at goal Continue Crestor 40 mg daily, aspirin 81 mg daily, Imdur 30 mg daily

## 2021-06-29 NOTE — Assessment & Plan Note (Signed)
Chronic Regular exercise and healthy diet encouraged LDL at goal Continue Crestor 40 mg daily

## 2021-06-29 NOTE — Assessment & Plan Note (Addendum)
Chronic  Clinically?  Hypothyroid-states increased fatigue TSH minimally elevated Discussed starting medication versus monitoring and he would like to try it because of increased fatigue Start levothyroxine 25 mcg daily Recheck TSH in 2 months

## 2021-07-09 ENCOUNTER — Ambulatory Visit: Payer: Medicare PPO | Admitting: Cardiology

## 2021-08-27 ENCOUNTER — Encounter: Payer: Self-pay | Admitting: Internal Medicine

## 2021-08-27 NOTE — Progress Notes (Signed)
? ? ?Subjective:  ? ? Patient ID: Samuel Flowers, male    DOB: 04/24/1951, 71 y.o.   MRN: [id number] ? ?This visit occurred during the SARS-CoV-2 public health emergency.  Safety protocols were in place, including screening questions prior to the visit, additional usage of staff PPE, and extensive cleaning of exam room while observing appropriate contact time as indicated for disinfecting solutions. ? ? ? ?HPI ?Samuel Flowers is here for  ?Chief Complaint  ?Patient presents with  ? Ear Fullness  ?  Bilateral ear fullness  ? ? ? ?Went to have his hearing aids checked and has b/l ear canal wax that needs to be cleaned out.  He denies pain or discomfort.  He has no other symptoms.  ? ? ? ?Medications and allergies reviewed with patient and updated if appropriate. ? ?Current Outpatient Medications on File Prior to Visit  ?Medication Sig Dispense Refill  ? aspirin 81 MG EC tablet Take 81 mg by mouth daily.     ? diphenhydrAMINE (BENADRYL) 25 MG tablet Take 25 mg by mouth every 6 (six) hours as needed.    ? ibuprofen (ADVIL) 200 MG tablet Take 200 mg by mouth every 6 (six) hours as needed.    ? isosorbide mononitrate (IMDUR) 30 MG 24 hr tablet Take 1 tablet (30 mg total) by mouth daily. 90 tablet 3  ? levothyroxine (SYNTHROID) 25 MCG tablet Take 1 tablet (25 mcg total) by mouth daily. 90 tablet 3  ? loratadine (CLARITIN) 10 MG tablet Take 10 mg by mouth daily.    ? meloxicam (MOBIC) 15 MG tablet Take 1 tablet (15 mg total) by mouth daily. (Patient taking differently: Take 15 mg by mouth as needed.) 30 tablet 0  ? metFORMIN (GLUCOPHAGE) 500 MG tablet Take 1 tablet (500 mg total) by mouth 2 (two) times daily with a meal. 180 tablet 3  ? rosuvastatin (CRESTOR) 40 MG tablet Take 1 tablet (40 mg total) by mouth daily. 90 tablet 3  ? ?No current facility-administered medications on file prior to visit.  ? ? ?Review of Systems ? ?   ?Objective:  ? ?Vitals:  ? 08/28/21 1340  ?BP: 120/72  ?Pulse: 62  ?Temp: 97.9 ?F (36.6 ?C)  ?SpO2: 98%   ? ?BP Readings from Last 3 Encounters:  ?08/28/21 120/72  ?06/29/21 130/80  ?01/01/21 130/70  ? ?Wt Readings from Last 3 Encounters:  ?08/28/21 172 lb 6.4 oz (78.2 kg)  ?06/29/21 173 lb (78.5 kg)  ?01/01/21 172 lb (78 kg)  ? ?Body mass index is 26.21 kg/m?. ? ?  ?Physical Exam ?   ?PRE-PROCEDURE EXAM: Right TM cannot be visualized due to total occlusion/impaction of the ear canal. ?PROCEDURE INDICATION: remove wax to visualize ear drum & relieve discomfort ?CONSENT:  Verbal ? ?PROCEDURE NOTE:  ? ?RIGHT EAR:  The CMA used a metal wax curette under direct vision with an otoscope to free the wax bolus from the ear wall and then successfully removed a small bit of wax. The ear was then irrigated with warm water to remove the remaining wax. ?POST- PROCEDURE EXAM: TMs successfully visualized and found to have no erythema  ? ?LEFT EAR:  The CMA used a metal wax curette under direct vision with an otoscope to free the wax bolus from the ear wall and then successfully removed a small bit of wax. The ear was then irrigated with warm water to remove the remaining wax. ?POST- PROCEDURE EXAM: TMs successfully visualized and found to have no erythema  ? ?  Procedure was well tolerated.   ? ? ?Assessment & Plan:  ? ? ?See Problem List for Assessment and Plan of chronic medical problems.  ? ? ? ? ?

## 2021-08-28 ENCOUNTER — Ambulatory Visit: Payer: Medicare PPO | Admitting: Internal Medicine

## 2021-08-28 VITALS — BP 120/72 | HR 62 | Temp 97.9°F | Ht 68.0 in | Wt 172.4 lb

## 2021-08-28 DIAGNOSIS — H6123 Impacted cerumen, bilateral: Secondary | ICD-10-CM

## 2021-08-28 NOTE — Patient Instructions (Signed)
? ?Your ears were successfully cleaned out today. ? ? ?Earwax Buildup, Adult ?The ears produce a substance called earwax that helps keep bacteria out of the ear and protects the skin in the ear canal. Occasionally, earwax can build up in the ear and cause discomfort or hearing loss. ?What are the causes? ?This condition is caused by a buildup of earwax. Ear canals are self-cleaning. Ear wax is made in the outer part of the ear canal and generally falls out in small amounts over time. ?When the self-cleaning mechanism is not working, earwax builds up and can cause decreased hearing and discomfort. Attempting to clean ears with cotton swabs can push the earwax deep into the ear canal and cause decreased hearing and pain. ?What increases the risk? ?This condition is more likely to develop in people who: ?Clean their ears often with cotton swabs. ?Pick at their ears. ?Use earplugs or in-ear headphones often, or wear hearing aids. ?The following factors may also make you more likely to develop this condition: ?Being male. ?Being of older age. ?Naturally producing more earwax. ?Having narrow ear canals. ?Having earwax that is overly thick or sticky. ?Having excess hair in the ear canal. ?Having eczema. ?Being dehydrated. ?What are the signs or symptoms? ?Symptoms of this condition include: ?Reduced or muffled hearing. ?A feeling of fullness in the ear or feeling that the ear is plugged. ?Fluid coming from the ear. ?Ear pain or an itchy ear. ?Ringing in the ear. ?Coughing. ?Balance problems. ?An obvious piece of earwax that can be seen inside the ear canal. ?How is this diagnosed? ?This condition may be diagnosed based on: ?Your symptoms. ?Your medical history. ?An ear exam. During the exam, your health care provider will look into your ear with an instrument called an otoscope. ?You may have tests, including a hearing test. ?How is this treated? ?This condition may be treated by: ?Using ear drops to soften the  earwax. ?Having the earwax removed by a health care provider. The health care provider may: ?Flush the ear with water. ?Use an instrument that has a loop on the end (curette). ?Use a suction device. ?Having surgery to remove the wax buildup. This may be done in severe cases. ?Follow these instructions at home: ? ?Take over-the-counter and prescription medicines only as told by your health care provider. ?Do not put any objects, including cotton swabs, into your ear. You can clean the opening of your ear canal with a washcloth or facial tissue. ?Follow instructions from your health care provider about cleaning your ears. Do not overclean your ears. ?Drink enough fluid to keep your urine pale yellow. This will help to thin the earwax. ?Keep all follow-up visits as told. If earwax builds up in your ears often or if you use hearing aids, consider seeing your health care provider for routine, preventive ear cleanings. Ask your health care provider how often you should schedule your cleanings. ?If you have hearing aids, clean them according to instructions from the manufacturer and your health care provider. ?Contact a health care provider if: ?You have ear pain. ?You develop a fever. ?You have pus or other fluid coming from your ear. ?You have hearing loss. ?You have ringing in your ears that does not go away. ?You feel like the room is spinning (vertigo). ?Your symptoms do not improve with treatment. ?Get help right away if: ?You have bleeding from the affected ear. ?You have severe ear pain. ?Summary ?Earwax can build up in the ear and cause discomfort or  hearing loss. ?The most common symptoms of this condition include reduced or muffled hearing, a feeling of fullness in the ear, or feeling that the ear is plugged. ?This condition may be diagnosed based on your symptoms, your medical history, and an ear exam. ?This condition may be treated by using ear drops to soften the earwax or by having the earwax removed by a  health care provider. ?Do not put any objects, including cotton swabs, into your ear. You can clean the opening of your ear canal with a washcloth or facial tissue. ?This information is not intended to replace advice given to you by your health care provider. Make sure you discuss any questions you have with your health care provider. ?Document Revised: 08/17/2019 Document Reviewed: 08/17/2019 ?Elsevier Patient Education ? [place]. ? ? ?

## 2021-08-28 NOTE — Assessment & Plan Note (Signed)
Acute ?Went to audiologist today for hearing aids checked and has excessive cerumen in both ear canals and was advised to have them cleaned out ?Denies pain, discomfort or major changes in his hearing ?We will do bilateral ear lavage ?

## 2021-08-28 NOTE — Progress Notes (Signed)
Patient consent obtained. ?Irrigation with water and peroxide performed on both ears. Full view of tympanic membranes after procedure.  ?Patient tolerated procedure well.  ? ?

## 2021-08-30 ENCOUNTER — Other Ambulatory Visit: Payer: Medicare PPO

## 2021-08-31 ENCOUNTER — Other Ambulatory Visit (INDEPENDENT_AMBULATORY_CARE_PROVIDER_SITE_OTHER): Payer: Medicare PPO

## 2021-08-31 DIAGNOSIS — R7303 Prediabetes: Secondary | ICD-10-CM

## 2021-08-31 DIAGNOSIS — E038 Other specified hypothyroidism: Secondary | ICD-10-CM

## 2021-08-31 LAB — TSH: TSH: 6.22 u[IU]/mL — ABNORMAL HIGH (ref 0.35–5.50)

## 2021-08-31 LAB — HEMOGLOBIN A1C: Hgb A1c MFr Bld: 6.2 % (ref 4.6–6.5)

## 2021-09-14 ENCOUNTER — Encounter: Payer: Self-pay | Admitting: Internal Medicine

## 2021-09-15 DIAGNOSIS — R5383 Other fatigue: Secondary | ICD-10-CM | POA: Diagnosis not present

## 2021-09-15 DIAGNOSIS — R1032 Left lower quadrant pain: Secondary | ICD-10-CM | POA: Diagnosis not present

## 2021-09-15 DIAGNOSIS — R1031 Right lower quadrant pain: Secondary | ICD-10-CM | POA: Diagnosis not present

## 2021-09-15 DIAGNOSIS — R109 Unspecified abdominal pain: Secondary | ICD-10-CM | POA: Insufficient documentation

## 2021-09-20 DIAGNOSIS — R5383 Other fatigue: Secondary | ICD-10-CM | POA: Insufficient documentation

## 2021-10-17 DIAGNOSIS — L738 Other specified follicular disorders: Secondary | ICD-10-CM | POA: Diagnosis not present

## 2021-10-17 DIAGNOSIS — L218 Other seborrheic dermatitis: Secondary | ICD-10-CM | POA: Diagnosis not present

## 2021-10-17 DIAGNOSIS — L821 Other seborrheic keratosis: Secondary | ICD-10-CM | POA: Diagnosis not present

## 2021-10-17 DIAGNOSIS — D692 Other nonthrombocytopenic purpura: Secondary | ICD-10-CM | POA: Diagnosis not present

## 2021-10-30 ENCOUNTER — Encounter: Payer: Self-pay | Admitting: Internal Medicine

## 2021-11-02 ENCOUNTER — Ambulatory Visit: Payer: Medicare PPO | Admitting: Podiatry

## 2021-11-05 ENCOUNTER — Ambulatory Visit (INDEPENDENT_AMBULATORY_CARE_PROVIDER_SITE_OTHER): Payer: Medicare PPO

## 2021-11-05 DIAGNOSIS — Z0000 Encounter for general adult medical examination without abnormal findings: Secondary | ICD-10-CM

## 2021-11-05 NOTE — Progress Notes (Signed)
I connected with Samuel Flowers today by telephone and verified that I am speaking with the correct person using two identifiers. Location patient: home Location provider: work Persons participating in the virtual visit: patient, provider.   I discussed the limitations, risks, security and privacy concerns of performing an evaluation and management service by telephone and the availability of in person appointments. I also discussed with the patient that there may be a patient responsible charge related to this service. The patient expressed understanding and verbally consented to this telephonic visit.    Interactive audio and video telecommunications were attempted between this provider and patient, however failed, due to patient having technical difficulties OR patient did not have access to video capability.  We continued and completed visit with audio only.  Some vital signs may be absent or patient reported.   Time Spent with patient on telephone encounter: 30 minutes  Subjective:   Samuel Flowers is a 71 y.o. male who presents for Medicare Annual/Subsequent preventive examination.  Review of Systems     Cardiac Risk Factors include: advanced age (>84men, >21 women);diabetes mellitus;dyslipidemia;family history of premature cardiovascular disease;male gender     Objective:    There were no vitals filed for this visit. There is no height or weight on file to calculate BMI.     11/05/2021   11:05 AM 05/05/2014   10:08 AM  Advanced Directives  Does Patient Have a Medical Advance Directive? Yes No  Type of Advance Directive Living will;Healthcare Power of Attorney   Does patient want to make changes to medical advance directive? No - Patient declined   Copy of Healthcare Power of Attorney in Chart? No - copy requested   Would patient like information on creating a medical advance directive?  No - patient declined information    Current Medications (verified) Outpatient  Encounter Medications as of 11/05/2021  Medication Sig   aspirin 81 MG EC tablet Take 81 mg by mouth daily.    diphenhydrAMINE (BENADRYL) 25 MG tablet Take 25 mg by mouth every 6 (six) hours as needed.   ibuprofen (ADVIL) 200 MG tablet Take 200 mg by mouth every 6 (six) hours as needed.   isosorbide mononitrate (IMDUR) 30 MG 24 hr tablet Take 1 tablet (30 mg total) by mouth daily.   levothyroxine (SYNTHROID) 25 MCG tablet Take 1 tablet (25 mcg total) by mouth daily.   loratadine (CLARITIN) 10 MG tablet Take 10 mg by mouth daily.   meloxicam (MOBIC) 15 MG tablet Take 1 tablet (15 mg total) by mouth daily. (Patient taking differently: Take 15 mg by mouth as needed.)   metFORMIN (GLUCOPHAGE) 500 MG tablet Take 1 tablet (500 mg total) by mouth 2 (two) times daily with a meal.   rosuvastatin (CRESTOR) 40 MG tablet Take 1 tablet (40 mg total) by mouth daily.   No facility-administered encounter medications on file as of 11/05/2021.    Allergies (verified) Pollen extract and Cat hair extract   History: Past Medical History:  Diagnosis Date   Acute asthmatic bronchitis    Allergy    pollen   BPH (benign prostatic hypertrophy)    mild   C. difficile colitis    CAD (coronary artery disease)    Colitis    Difficulty urinating 2017   self cath for all urination   Fatty liver    Hyperlipemia    Hypothyroidism    IBS (irritable bowel syndrome)    Nonspecific colitis    Rhinitis, allergic  Past Surgical History:  Procedure Laterality Date   CATARACT EXTRACTION, BILATERAL Bilateral 2016   COLONOSCOPY  2010   CORONARY ARTERY BYPASS GRAFT  2002   EYE SURGERY     LUMBAR LAMINECTOMY  1990   Family History  Problem Relation Age of Onset   Colon polyps Father    Coronary artery disease Father    Heart disease Father    Diabetes Father    Hyperlipidemia Father    Coronary artery disease Mother    Mental illness Mother    Colon cancer Paternal Aunt    Irritable bowel syndrome Neg Hx     Stroke Neg Hx    Alcohol abuse Neg Hx    Cancer Neg Hx    Depression Neg Hx    Early death Neg Hx    Hypertension Neg Hx    Esophageal cancer Neg Hx    Rectal cancer Neg Hx    Stomach cancer Neg Hx    Social History   Socioeconomic History   Marital status: Married    Spouse name: Not on file   Number of children: 2   Years of education: Not on file   Highest education level: Not on file  Occupational History   Occupation: Retired  Tobacco Use   Smoking status: Never   Smokeless tobacco: Never  Vaping Use   Vaping Use: Never used  Substance and Sexual Activity   Alcohol use: Yes    Alcohol/week: 3.0 standard drinks of alcohol    Types: 3 Standard drinks or equivalent per week    Comment: occasionally   Drug use: No   Sexual activity: Yes  Other Topics Concern   Not on file  Social History Narrative   Not on file   Social Determinants of Health   Financial Resource Strain: Low Risk  (11/05/2021)   Overall Financial Resource Strain (CARDIA)    Difficulty of Paying Living Expenses: Not hard at all  Food Insecurity: No Food Insecurity (11/05/2021)   Hunger Vital Sign    Worried About Running Out of Food in the Last Year: Never true    Ran Out of Food in the Last Year: Never true  Transportation Needs: No Transportation Needs (11/05/2021)   PRAPARE - Administrator, Civil Service (Medical): No    Lack of Transportation (Non-Medical): No  Physical Activity: Sufficiently Active (11/05/2021)   Exercise Vital Sign    Days of Exercise per Week: 5 days    Minutes of Exercise per Session: 30 min  Stress: No Stress Concern Present (11/05/2021)   [employer] of Occupational Health - Occupational Stress Questionnaire    Feeling of Stress : Not at all  Social Connections: Socially Integrated (11/05/2021)   Social Connection and Isolation Panel [NHANES]    Frequency of Communication with Friends and Family: More than three times a week    Frequency of Social  Gatherings with Friends and Family: More than three times a week    Attends Religious Services: More than 4 times per year    Active Member of [employer] or Organizations: Yes    Attends Engineer, structural: More than 4 times per year    Marital Status: Married    Tobacco Counseling Counseling given: Not Answered   Clinical Intake:  Pre-visit preparation completed: Yes  Pain : No/denies pain     BMI - recorded: 26.22 Nutritional Status: BMI 25 -29 Overweight Nutritional Risks: None Diabetes: No  How often do you need  to have someone help you when you read instructions, pamphlets, or other written materials from your doctor or pharmacy?: 1 - Never What is the last grade level you completed in school?: Bachelor's Degree  Diabetic? yes  Interpreter Needed?: No  Information entered by :: Susie Cassette, LPN.   Activities of Daily Living    11/05/2021   11:22 AM 11/16/2020    7:51 AM  In your present state of health, do you have any difficulty performing the following activities:  Hearing? 1 0  Comment wears hearing aids   Vision? 0 0  Difficulty concentrating or making decisions? 0 0  Walking or climbing stairs? 0 0  Dressing or bathing? 0 0  Doing errands, shopping? 0 0  Preparing Food and eating ? N   Using the Toilet? N   In the past six months, have you accidently leaked urine? N   Do you have problems with loss of bowel control? N   Managing your Medications? N   Managing your Finances? N   Housekeeping or managing your Housekeeping? N     Patient Care Team: Pincus Sanes, MD as PCP - General (Internal Medicine) Jens Som Madolyn Frieze, MD as PCP - Cardiology (Cardiology) Nelson Chimes, MD as Attending Physician (Ophthalmology)  Indicate any recent Medical Services you may have received from other than [facility] providers in the past year (date may be approximate).     Assessment:   This is a routine wellness examination for [place].  Hearing/Vision  screen Hearing Screening - Comments:: Patient wears hearing aids. Vision Screening - Comments:: Patient does wear corrective lenses/contacts.  [facility] exam done by: [facility]   Dietary issues and exercise activities discussed: Current Exercise Habits: Home exercise routine, Type of exercise: walking, Time (Minutes): 30, Frequency (Times/Week): 5, Weekly Exercise (Minutes/Week): 150, Intensity: Moderate, Exercise limited by: None identified   Goals Addressed             This Visit's Progress    Continue to get younger every year.        Depression Screen    11/05/2021   11:08 AM 06/29/2021    2:52 PM 11/16/2020    7:51 AM 11/11/2019    9:31 AM 06/16/2018    1:42 PM 07/06/2017    1:10 PM 07/03/2017    2:42 PM  PHQ 2/9 Scores  PHQ - 2 Score 0 0 0 0 0 0 0    Fall Risk    11/05/2021   11:06 AM 06/29/2021    2:52 PM 11/16/2020    7:50 AM 11/11/2019    9:31 AM 06/16/2018    1:42 PM  Fall Risk   Falls in the past year? 0 1 0 0 0  Number falls in past yr: 0 1 0 0   Injury with Fall? 0 0 0 0   Risk for fall due to : No Fall Risks No Fall Risks No Fall Risks No Fall Risks   Follow up Falls evaluation completed Falls evaluation completed Falls evaluation completed Falls evaluation completed     FALL RISK PREVENTION PERTAINING TO THE HOME:  Any stairs in or around the home? Yes  If so, are there any without handrails? No  Home free of loose throw rugs in walkways, pet beds, electrical cords, etc? Yes  Adequate lighting in your home to reduce risk of falls? Yes   ASSISTIVE DEVICES UTILIZED TO PREVENT FALLS:  Life alert? No  Use of a cane, walker or w/c? No  Grab bars in the bathroom? Yes  Shower chair or bench in shower? Yes  Elevated toilet seat or a handicapped toilet? Yes   TIMED UP AND GO:  Was the test performed? No .  Length of time to ambulate 10 feet: n/a sec.   Appearance of gait: Patient not evaluated for gait during this visit.  Cognitive Function:         11/05/2021   11:23 AM  6CIT Screen  What Year? 0 points  What month? 0 points  What time? 0 points  Count back from 20 0 points  Months in reverse 0 points  Repeat phrase 0 points  Total Score 0 points    Immunizations Immunization History  Administered Date(s) Administered   Influenza Whole 02/10/2009   Influenza, High Dose Seasonal PF 07/03/2017, 06/16/2018, 02/26/2019   Influenza-Unspecified 03/19/2021   Moderna SARS-COV2 Booster Vaccination 07/30/2020   PFIZER(Purple Top)SARS-COV-2 Vaccination 06/17/2019, 07/12/2019, 01/27/2020   Pfizer Covid-19 Vaccine Bivalent Booster 4yrs & up 01/21/2021   Pneumococcal Conjugate-13 07/03/2017   Pneumococcal Polysaccharide-23 10/30/2011, 11/16/2020   Tdap 10/30/2011    TDAP status: Due, Education has been provided regarding the importance of this vaccine. Advised may receive this vaccine at local pharmacy or Health Dept. Aware to provide a copy of the vaccination record if obtained from local pharmacy or Health Dept. Verbalized acceptance and understanding.  Flu Vaccine status: Up to date  Pneumococcal vaccine status: Up to date  Covid-19 vaccine status: Completed vaccines  Qualifies for Shingles Vaccine? Yes   Zostavax completed No   Shingrix Completed?: No.    Education has been provided regarding the importance of this vaccine. Patient has been advised to call insurance company to determine out of pocket expense if they have not yet received this vaccine. Advised may also receive vaccine at local pharmacy or Health Dept. Verbalized acceptance and understanding.  Screening Tests Health Maintenance  Topic Date Due   Zoster Vaccines- Shingrix (1 of 2) Never done   TETANUS/TDAP  10/29/2021   INFLUENZA VACCINE  12/11/2021   COLONOSCOPY (Pts 45-34yrs Insurance coverage will need to be confirmed)  04/28/2026   Pneumonia Vaccine 1+ Years old  Completed   COVID-19 Vaccine  Completed   Hepatitis C Screening  Completed   HPV VACCINES   Aged Out    Health Maintenance  Health Maintenance Due  Topic Date Due   Zoster Vaccines- Shingrix (1 of 2) Never done   TETANUS/TDAP  10/29/2021    Colorectal cancer screening: Type of screening: Colonoscopy. Completed 04/29/2019. Repeat every 7 years  Lung Cancer Screening: (Low Dose CT Chest recommended if Age 74-80 years, 30 pack-year currently smoking OR have quit w/in 15years.) does not qualify.   Lung Cancer Screening Referral: no  Additional Screening:  Hepatitis C Screening: does qualify; Completed 08/01/2017  Vision Screening: Recommended annual ophthalmology exams for early detection of glaucoma and other disorders of the eye. Is the patient up to date with their annual eye exam?  Yes  Who is the provider or what is the name of the office in which the patient attends annual eye exams? [facility] If pt is not established with a provider, would they like to be referred to a provider to establish care? No .   Dental Screening: Recommended annual dental exams for proper oral hygiene  Community Resource Referral / Chronic Care Management: CRR required this visit?  No   CCM required this visit?  No      Plan:  I have personally reviewed and noted the following in the patient's chart:   Medical and social history Use of alcohol, tobacco or illicit drugs  Current medications and supplements including opioid prescriptions. Patient is not currently taking opioid prescriptions. Functional ability and status Nutritional status Physical activity Advanced directives List of other physicians Hospitalizations, surgeries, and ER visits in previous 12 months Vitals Screenings to include cognitive, depression, and falls Referrals and appointments  In addition, I have reviewed and discussed with patient certain preventive protocols, quality metrics, and best practice recommendations. A written personalized care plan for preventive services as well as general  preventive health recommendations were provided to patient.     Mickeal Needy, LPN   1/61/0960   Nurse Notes:  Patient is cogitatively intact. There were no vitals filed for this visit. There is no height or weight on file to calculate BMI. Patient stated that he has no issues with gait or balance; does not use any assistive devices.

## 2021-11-27 ENCOUNTER — Ambulatory Visit: Payer: Medicare PPO | Admitting: Family Medicine

## 2021-11-28 ENCOUNTER — Encounter: Payer: Self-pay | Admitting: Family Medicine

## 2021-11-28 ENCOUNTER — Ambulatory Visit (INDEPENDENT_AMBULATORY_CARE_PROVIDER_SITE_OTHER): Payer: Medicare PPO

## 2021-11-28 ENCOUNTER — Ambulatory Visit: Payer: Medicare PPO | Admitting: Family Medicine

## 2021-11-28 VITALS — BP 118/60 | HR 56 | Temp 97.2°F | Ht 68.0 in | Wt 167.0 lb

## 2021-11-28 DIAGNOSIS — R059 Cough, unspecified: Secondary | ICD-10-CM | POA: Diagnosis not present

## 2021-11-28 DIAGNOSIS — R058 Other specified cough: Secondary | ICD-10-CM

## 2021-11-28 DIAGNOSIS — H9193 Unspecified hearing loss, bilateral: Secondary | ICD-10-CM | POA: Diagnosis not present

## 2021-11-28 DIAGNOSIS — Z8616 Personal history of COVID-19: Secondary | ICD-10-CM

## 2021-11-28 MED ORDER — BENZONATATE 200 MG PO CAPS: 200.0000 mg | ORAL_CAPSULE | Freq: Two times a day (BID) | ORAL | 0 refills | Status: DC | PRN

## 2021-11-28 MED ORDER — AZITHROMYCIN 250 MG PO TABS: ORAL_TABLET | ORAL | 0 refills | Status: AC

## 2021-11-28 NOTE — Progress Notes (Signed)
Subjective:     Patient ID: Samuel Flowers, male    DOB: 17-Dec-1950, 71 y.o.   MRN: [id number]  Chief Complaint  Patient presents with   Cough    Lingering covid symptoms in chest, COVID was over a month ago   Nasal Congestion    HPI Patient is in today for lingering cough and chest congestion. Thick mucous and having to cough it up.  States he had Covid 3-4 weeks ago. No fever, chills, chest pain, palpitations, shortness of breath, wheezing, N/V/D, LE edema.   He has been taking Mucinex.   Health Maintenance Due  Topic Date Due   Zoster Vaccines- Shingrix (1 of 2) Never done   TETANUS/TDAP  10/29/2021    Past Medical History:  Diagnosis Date   Acute asthmatic bronchitis    Allergy    pollen   BPH (benign prostatic hypertrophy)    mild   C. difficile colitis    CAD (coronary artery disease)    Colitis    Difficulty urinating 2017   self cath for all urination   Fatty liver    Hyperlipemia    Hypothyroidism    IBS (irritable bowel syndrome)    Nonspecific colitis    Rhinitis, allergic     Past Surgical History:  Procedure Laterality Date   CATARACT EXTRACTION, BILATERAL Bilateral 2016   COLONOSCOPY  2010   CORONARY ARTERY BYPASS GRAFT  2002   EYE SURGERY     LUMBAR LAMINECTOMY  1990    Family History  Problem Relation Age of Onset   Colon polyps Father    Coronary artery disease Father    Heart disease Father    Diabetes Father    Hyperlipidemia Father    Coronary artery disease Mother    Mental illness Mother    Colon cancer Paternal Aunt    Irritable bowel syndrome Neg Hx    Stroke Neg Hx    Alcohol abuse Neg Hx    Cancer Neg Hx    Depression Neg Hx    Early death Neg Hx    Hypertension Neg Hx    Esophageal cancer Neg Hx    Rectal cancer Neg Hx    Stomach cancer Neg Hx     Social History   Socioeconomic History   Marital status: Married    Spouse name: Not on file   Number of children: 2   Years of education: Not on file   Highest  education level: Not on file  Occupational History   Occupation: Retired  Tobacco Use   Smoking status: Never   Smokeless tobacco: Never  Vaping Use   Vaping Use: Never used  Substance and Sexual Activity   Alcohol use: Yes    Alcohol/week: 3.0 standard drinks of alcohol    Types: 3 Standard drinks or equivalent per week    Comment: occasionally   Drug use: No   Sexual activity: Yes  Other Topics Concern   Not on file  Social History Narrative   Not on file   Social Determinants of Health   Financial Resource Strain: Low Risk  (11/05/2021)   Overall Financial Resource Strain (CARDIA)    Difficulty of Paying Living Expenses: Not hard at all  Food Insecurity: No Food Insecurity (11/05/2021)   Hunger Vital Sign    Worried About Running Out of Food in the Last Year: Never true    Ran Out of Food in the Last Year: Never true  Transportation Needs: No Transportation  Needs (11/05/2021)   PRAPARE - Hydrologist (Medical): No    Lack of Transportation (Non-Medical): No  Physical Activity: Sufficiently Active (11/05/2021)   Exercise Vital Sign    Days of Exercise per Week: 5 days    Minutes of Exercise per Session: 30 min  Stress: No Stress Concern Present (11/05/2021)   [place]    Feeling of Stress : Not at all  Social Connections: [place] (11/05/2021)   Social Connection and Isolation Panel [NHANES]    Frequency of Communication with Friends and Family: More than three times a week    Frequency of Social Gatherings with Friends and Family: More than three times a week    Attends Religious Services: More than 4 times per year    Active Member of [employer] or Organizations: Yes    Attends Music therapist: More than 4 times per year    Marital Status: Married  Human resources officer Violence: Not At Risk (11/05/2021)   Humiliation, Afraid, Rape, and Kick questionnaire     Fear of Current or Ex-Partner: No    Emotionally Abused: No    Physically Abused: No    Sexually Abused: No    Outpatient Medications Prior to Visit  Medication Sig Dispense Refill   aspirin 81 MG EC tablet Take 81 mg by mouth daily.      diphenhydrAMINE (BENADRYL) 25 MG tablet Take 25 mg by mouth every 6 (six) hours as needed.     ibuprofen (ADVIL) 200 MG tablet Take 200 mg by mouth every 6 (six) hours as needed.     isosorbide mononitrate (IMDUR) 30 MG 24 hr tablet Take 1 tablet (30 mg total) by mouth daily. 90 tablet 3   levothyroxine (SYNTHROID) 25 MCG tablet Take 1 tablet (25 mcg total) by mouth daily. 90 tablet 3   loratadine (CLARITIN) 10 MG tablet Take 10 mg by mouth daily.     meloxicam (MOBIC) 15 MG tablet Take 1 tablet (15 mg total) by mouth daily. (Patient taking differently: Take 15 mg by mouth as needed.) 30 tablet 0   metFORMIN (GLUCOPHAGE) 500 MG tablet Take 1 tablet (500 mg total) by mouth 2 (two) times daily with a meal. 180 tablet 3   rosuvastatin (CRESTOR) 40 MG tablet Take 1 tablet (40 mg total) by mouth daily. 90 tablet 3   No facility-administered medications prior to visit.    Allergies  Allergen Reactions   Grass Pollen([id number]-O-[id number]-T-Swt Vern)     Other reaction(s): eye swelling   Pollen Extract     Other reaction(s): Other   Cat Hair Extract Itching    ROS     Objective:    Physical Exam Constitutional:      General: He is not in acute distress.    Appearance: He is not ill-appearing.  HENT:     Right Ear: Tympanic membrane and ear canal normal.     Left Ear: Tympanic membrane and ear canal normal.     Mouth/Throat:     Mouth: Mucous membranes are moist.     Pharynx: Oropharynx is clear.  Eyes:     Conjunctiva/sclera: Conjunctivae normal.     Pupils: Pupils are equal, round, and reactive to light.  Cardiovascular:     Rate and Rhythm: Normal rate.  Pulmonary:     Effort: Pulmonary effort is normal.     Breath sounds: Normal breath sounds.   Musculoskeletal:  Cervical back: Normal range of motion and neck supple. No tenderness.  Lymphadenopathy:     Cervical: No cervical adenopathy.  Skin:    General: Skin is warm and dry.     Capillary Refill: Capillary refill takes less than 2 seconds.  Neurological:     General: No focal deficit present.     Mental Status: He is alert and oriented to person, place, and time.  Psychiatric:        Mood and Affect: Mood normal.        Behavior: Behavior normal.     BP 118/60 (BP Location: Left Arm, Patient Position: Sitting, Cuff Size: Large)   Pulse (!) 56   Temp (!) 97.2 F (36.2 C) (Temporal)   Ht '5\' 8"'$  (1.727 m)   Wt 167 lb (75.8 kg)   SpO2 98%   BMI 25.39 kg/m  Wt Readings from Last 3 Encounters:  11/28/21 167 lb (75.8 kg)  08/28/21 172 lb 6.4 oz (78.2 kg)  06/29/21 173 lb (78.5 kg)       Assessment & Plan:   Problem List Items Addressed This Visit   None Visit Diagnoses     Cough present for greater than 3 weeks    -  Primary   Relevant Medications   azithromycin (ZITHROMAX) 250 MG tablet   benzonatate (TESSALON) 200 MG capsule   Other Relevant Orders   DG Chest 2 View   History of COVID-19       Relevant Orders   DG Chest 2 View   Decreased hearing of both ears          Here for acute visit. Lingering cough post Covid infection.  Chest XR ordered. He is going out of town for the next 3 weeks.  Azithromycin and Tessalon prescribed. Discussed symptomatic management.  Follow up with audiologist as scheduled. No cerumen impaction today.  Follow up if worsening.   I am having [name] start on azithromycin and benzonatate. I am also having him maintain his aspirin EC, loratadine, meloxicam, ibuprofen, diphenhydrAMINE, rosuvastatin, isosorbide mononitrate, metFORMIN, and levothyroxine.  Meds ordered this encounter  Medications   azithromycin (ZITHROMAX) 250 MG tablet    Sig: Take 2 tablets on day 1, then 1 tablet daily on days 2 through 5     Dispense:  6 tablet    Refill:  0    Order Specific Question:   Supervising Provider    Answer:   Pricilla Holm A [[id number]]   benzonatate (TESSALON) 200 MG capsule    Sig: Take 1 capsule (200 mg total) by mouth 2 (two) times daily as needed for cough.    Dispense:  20 capsule    Refill:  0    Order Specific Question:   Supervising Provider    Answer:   Pricilla Holm A [[id number]]

## 2021-11-28 NOTE — Patient Instructions (Signed)
Please go downstairs for chest x-ray before you leave today.  Take the antibiotic as prescribed.  I also prescribed a medication called Tessalon Perles for cough that you may use in addition to over-the-counter Mucinex or Delsym.  Follow-up if you are getting worse or not improving over the next 2 weeks.

## 2021-12-31 DIAGNOSIS — H18613 Keratoconus, stable, bilateral: Secondary | ICD-10-CM | POA: Diagnosis not present

## 2021-12-31 DIAGNOSIS — Z961 Presence of intraocular lens: Secondary | ICD-10-CM | POA: Diagnosis not present

## 2021-12-31 DIAGNOSIS — E119 Type 2 diabetes mellitus without complications: Secondary | ICD-10-CM | POA: Diagnosis not present

## 2021-12-31 DIAGNOSIS — H35371 Puckering of macula, right eye: Secondary | ICD-10-CM | POA: Diagnosis not present

## 2022-03-28 ENCOUNTER — Other Ambulatory Visit: Payer: Self-pay | Admitting: Cardiology

## 2022-03-28 ENCOUNTER — Other Ambulatory Visit: Payer: Self-pay | Admitting: Physician Assistant

## 2022-03-29 ENCOUNTER — Other Ambulatory Visit: Payer: Self-pay | Admitting: Cardiology

## 2022-05-01 ENCOUNTER — Other Ambulatory Visit: Payer: Self-pay | Admitting: Cardiology

## 2022-05-24 DIAGNOSIS — R339 Retention of urine, unspecified: Secondary | ICD-10-CM | POA: Diagnosis not present

## 2022-05-27 ENCOUNTER — Other Ambulatory Visit: Payer: Self-pay | Admitting: Cardiology

## 2022-06-10 ENCOUNTER — Ambulatory Visit: Payer: Medicare PPO | Admitting: Podiatry

## 2022-06-10 ENCOUNTER — Encounter: Payer: Self-pay | Admitting: Podiatry

## 2022-06-10 ENCOUNTER — Ambulatory Visit (INDEPENDENT_AMBULATORY_CARE_PROVIDER_SITE_OTHER): Payer: Medicare PPO

## 2022-06-10 DIAGNOSIS — M2041 Other hammer toe(s) (acquired), right foot: Secondary | ICD-10-CM

## 2022-06-10 DIAGNOSIS — M779 Enthesopathy, unspecified: Secondary | ICD-10-CM

## 2022-06-11 NOTE — Progress Notes (Signed)
Subjective:   Patient ID: Samuel Flowers, male   DOB: 72 y.o.   MRN: [id number]   HPI Patient presents stating the second toe has lifted to a significant level and is becoming worse over the left 1 and it is increasingly painful with shoe gear.  Patient does not smoke likes to be active   ROS      Objective:  Physical Exam  Neurovascular status intact with rigid contracture digit to right over left with redness on top of the toe and no longer contacting the surface.  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  At this point cushioning will be used and we will see how this does form along with wider toe box shoe and patient had questions answered today  X-rays do indicate severe rigid contracture digit to right over left foot with no bunion deformity noted currently or indications of arthritis

## 2022-07-02 ENCOUNTER — Encounter: Payer: Self-pay | Admitting: Internal Medicine

## 2022-07-02 NOTE — Patient Instructions (Signed)
Blood work was ordered.   The lab is on the first floor.    Medications changes include :   none     Return in about 1 year (around 07/04/2023) for Physical Exam.     Health Maintenance, Male Adopting a healthy lifestyle and getting preventive care are important in promoting health and wellness. Ask your health care provider about: The right schedule for you to have regular tests and exams. Things you can do on your own to prevent diseases and keep yourself healthy. What should I know about diet, weight, and exercise? Eat a healthy diet  Eat a diet that includes plenty of vegetables, fruits, low-fat dairy products, and lean protein. Do not eat a lot of foods that are high in solid fats, added sugars, or sodium. Maintain a healthy weight Body mass index (BMI) is a measurement that can be used to identify possible weight problems. It estimates body fat based on height and weight. Your health care provider can help determine your BMI and help you achieve or maintain a healthy weight. Get regular exercise Get regular exercise. This is one of the most important things you can do for your health. Most adults should: Exercise for at least 150 minutes each week. The exercise should increase your heart rate and make you sweat (moderate-intensity exercise). Do strengthening exercises at least twice a week. This is in addition to the moderate-intensity exercise. Spend less time sitting. Even light physical activity can be beneficial. Watch cholesterol and blood lipids Have your blood tested for lipids and cholesterol at 71 years of age, then have this test every 5 years. You may need to have your cholesterol levels checked more often if: Your lipid or cholesterol levels are high. You are older than 71 years of age. You are at high risk for heart disease. What should I know about cancer screening? Many types of cancers can be detected early and may often be prevented. Depending on  your health history and family history, you may need to have cancer screening at various ages. This may include screening for: Colorectal cancer. Prostate cancer. Skin cancer. Lung cancer. What should I know about heart disease, diabetes, and high blood pressure? Blood pressure and heart disease High blood pressure causes heart disease and increases the risk of stroke. This is more likely to develop in people who have high blood pressure readings or are overweight. Talk with your health care provider about your target blood pressure readings. Have your blood pressure checked: Every 3-5 years if you are 3-65 years of age. Every year if you are 39 years old or older. If you are between the ages of 70 and 37 and are a current or former smoker, ask your health care provider if you should have a one-time screening for abdominal aortic aneurysm (AAA). Diabetes Have regular diabetes screenings. This checks your fasting blood sugar level. Have the screening done: Once every three years after age 28 if you are at a normal weight and have a low risk for diabetes. More often and at a younger age if you are overweight or have a high risk for diabetes. What should I know about preventing infection? Hepatitis B If you have a higher risk for hepatitis B, you should be screened for this virus. Talk with your health care provider to find out if you are at risk for hepatitis B infection. Hepatitis C Blood testing is recommended for: Everyone born from [birth year] through 1964. Anyone with  known risk factors for hepatitis C. Sexually transmitted infections (STIs) You should be screened each year for STIs, including gonorrhea and chlamydia, if: You are sexually active and are younger than 71 years of age. You are older than 71 years of age and your health care provider tells you that you are at risk for this type of infection. Your sexual activity has changed since you were last screened, and you are at increased  risk for chlamydia or gonorrhea. Ask your health care provider if you are at risk. Ask your health care provider about whether you are at high risk for HIV. Your health care provider may recommend a prescription medicine to help prevent HIV infection. If you choose to take medicine to prevent HIV, you should first get tested for HIV. You should then be tested every 3 months for as long as you are taking the medicine. Follow these instructions at home: Alcohol use Do not drink alcohol if your health care provider tells you not to drink. If you drink alcohol: Limit how much you have to 0-2 drinks a day. Know how much alcohol is in your drink. In the [place] one drink equals one 12 oz bottle of beer (355 mL), one 5 oz glass of wine (148 mL), or one 1 oz glass of hard liquor (44 mL). Lifestyle Do not use any products that contain nicotine or tobacco. These products include cigarettes, chewing tobacco, and vaping devices, such as e-cigarettes. If you need help quitting, ask your health care provider. Do not use street drugs. Do not share needles. Ask your health care provider for help if you need support or information about quitting drugs. General instructions Schedule regular health, dental, and eye exams. Stay current with your vaccines. Tell your health care provider if: You often feel depressed. You have ever been abused or do not feel safe at home. Summary Adopting a healthy lifestyle and getting preventive care are important in promoting health and wellness. Follow your health care provider's instructions about healthy diet, exercising, and getting tested or screened for diseases. Follow your health care provider's instructions on monitoring your cholesterol and blood pressure. This information is not intended to replace advice given to you by your health care provider. Make sure you discuss any questions you have with your health care provider. Document Revised: 09/18/2020 Document  Reviewed: 09/18/2020 Elsevier Patient Education  [place].

## 2022-07-02 NOTE — Progress Notes (Unsigned)
Subjective:    Patient ID: Samuel Flowers, male    DOB: January 02, 1951, 72 y.o.   MRN: [id number]     HPI Samuel Flowers is here for a physical exam.   No changes. No concerns - a little fatigued the past couple of days.  Did not sleep 3 nights in row very well, which is likely the cause.  Otherwise doing well.    Medications and allergies reviewed with patient and updated if appropriate.  Current Outpatient Medications on File Prior to Visit  Medication Sig Dispense Refill   aspirin 81 MG EC tablet Take 81 mg by mouth daily.      diphenhydrAMINE (BENADRYL) 25 MG tablet Take 25 mg by mouth every 6 (six) hours as needed.     ibuprofen (ADVIL) 200 MG tablet Take 200 mg by mouth every 6 (six) hours as needed.     isosorbide mononitrate (IMDUR) 30 MG 24 hr tablet Take 1 tablet (30 mg total) by mouth daily. Patient must schedule annual office visit for future refills second attempt 15 tablet 0   levothyroxine (SYNTHROID) 25 MCG tablet Take 1 tablet (25 mcg total) by mouth daily. 90 tablet 3   loratadine (CLARITIN) 10 MG tablet Take 10 mg by mouth daily.     metFORMIN (GLUCOPHAGE) 500 MG tablet Take 1 tablet (500 mg total) by mouth 2 (two) times daily with a meal. 180 tablet 3   rosuvastatin (CRESTOR) 40 MG tablet Take 1 tablet (40 mg total) by mouth daily. Please schedule appointment with cardiologist for further refills. 1st attempt. 30 tablet 1   No current facility-administered medications on file prior to visit.    Review of Systems  Constitutional:  Negative for fever.  Eyes:  Negative for visual disturbance.  Respiratory:  Positive for shortness of breath (with stairs). Negative for cough and wheezing.   Cardiovascular:  Negative for chest pain, palpitations and leg swelling.  Gastrointestinal:  Negative for abdominal pain, blood in stool, constipation and diarrhea.       No gerd  Genitourinary:  Negative for hematuria.  Musculoskeletal:  Positive for back pain (occ - this am - right  lower back pain to hip). Negative for arthralgias.  Skin:  Negative for rash.  Neurological:  Negative for dizziness, light-headedness, numbness and headaches.  Psychiatric/Behavioral:  Negative for dysphoric mood. The patient is not nervous/anxious.        Objective:   Vitals:   07/03/22 1410  BP: 130/70  Pulse: (!) 59  Temp: 98.5 F (36.9 C)  SpO2: 95%   Filed Weights   07/03/22 1410  Weight: 172 lb (78 kg)   Body mass index is 26.15 kg/m.  BP Readings from Last 3 Encounters:  07/03/22 130/70  11/28/21 118/60  08/28/21 120/72    Wt Readings from Last 3 Encounters:  07/03/22 172 lb (78 kg)  11/28/21 167 lb (75.8 kg)  08/28/21 172 lb 6.4 oz (78.2 kg)      Physical Exam Constitutional: He appears well-developed and well-nourished. No distress.  HENT:  Head: Normocephalic and atraumatic.  Right Ear: External ear normal.  Left Ear: External ear normal.  Mouth/Throat: Oropharynx is clear and moist.  Normal ear canals and TM b/l  Eyes: Conjunctivae and EOM are normal.  Neck: Neck supple. No tracheal deviation present. No thyromegaly present.  No carotid bruit  Cardiovascular: Normal rate, regular rhythm, normal heart sounds and intact distal pulses.   No murmur heard. Pulmonary/Chest: Effort normal and breath sounds normal. No  respiratory distress. He has no wheezes. He has no rales.  Abdominal: Soft. He exhibits no distension. There is no tenderness.  Genitourinary: deferred  Musculoskeletal: He exhibits no edema.  Lymphadenopathy:   He has no cervical adenopathy.  Skin: Skin is warm and dry. He is not diaphoretic.  Psychiatric: He has a normal mood and affect. His behavior is normal.         Assessment & Plan:   Physical exam: Screening blood work  ordered Exercise   walking daily Weight   good for age Substance abuse   none   Reviewed recommended immunizations.   Health Maintenance  Topic Date Due   Zoster Vaccines- Shingrix (1 of 2) Never  done   DTaP/Tdap/Td (2 - Td or Tdap) 10/29/2021   COVID-19 Vaccine (5 - 2023-24 season) 07/19/2022 (Originally 01/11/2022)   Medicare Annual Wellness ([place])  11/06/2022   COLONOSCOPY (Pts 45-75yr Insurance coverage will need to be confirmed)  04/28/2026   Pneumonia Vaccine 72 Years old  Completed   INFLUENZA VACCINE  Completed   Hepatitis C Screening  Completed   HPV VACCINES  Aged Out     See Problem List for Assessment and Plan of chronic medical problems.

## 2022-07-03 ENCOUNTER — Encounter: Payer: Self-pay | Admitting: Internal Medicine

## 2022-07-03 ENCOUNTER — Ambulatory Visit (INDEPENDENT_AMBULATORY_CARE_PROVIDER_SITE_OTHER): Payer: Medicare PPO | Admitting: Internal Medicine

## 2022-07-03 VITALS — BP 130/70 | HR 59 | Temp 98.5°F | Ht 68.0 in | Wt 172.0 lb

## 2022-07-03 DIAGNOSIS — Z23 Encounter for immunization: Secondary | ICD-10-CM

## 2022-07-03 DIAGNOSIS — I25810 Atherosclerosis of coronary artery bypass graft(s) without angina pectoris: Secondary | ICD-10-CM

## 2022-07-03 DIAGNOSIS — E039 Hypothyroidism, unspecified: Secondary | ICD-10-CM

## 2022-07-03 DIAGNOSIS — Z0000 Encounter for general adult medical examination without abnormal findings: Secondary | ICD-10-CM | POA: Diagnosis not present

## 2022-07-03 DIAGNOSIS — E7800 Pure hypercholesterolemia, unspecified: Secondary | ICD-10-CM | POA: Insufficient documentation

## 2022-07-03 DIAGNOSIS — R7303 Prediabetes: Secondary | ICD-10-CM | POA: Diagnosis not present

## 2022-07-03 DIAGNOSIS — E785 Hyperlipidemia, unspecified: Secondary | ICD-10-CM

## 2022-07-03 DIAGNOSIS — R739 Hyperglycemia, unspecified: Secondary | ICD-10-CM | POA: Insufficient documentation

## 2022-07-03 DIAGNOSIS — E038 Other specified hypothyroidism: Secondary | ICD-10-CM

## 2022-07-03 LAB — CBC WITH DIFFERENTIAL/PLATELET
Basophils Absolute: 0 10*3/uL (ref 0.0–0.1)
Basophils Relative: 0.6 % (ref 0.0–3.0)
Eosinophils Absolute: 0.2 10*3/uL (ref 0.0–0.7)
Eosinophils Relative: 3.4 % (ref 0.0–5.0)
HCT: 44.4 % (ref 39.0–52.0)
Hemoglobin: 15.3 g/dL (ref 13.0–17.0)
Lymphocytes Relative: 23.5 % (ref 12.0–46.0)
Lymphs Abs: 1.5 10*3/uL (ref 0.7–4.0)
MCHC: 34.4 g/dL (ref 30.0–36.0)
MCV: 95.3 fl (ref 78.0–100.0)
Monocytes Absolute: 0.6 10*3/uL (ref 0.1–1.0)
Monocytes Relative: 9.1 % (ref 3.0–12.0)
Neutro Abs: 4.2 10*3/uL (ref 1.4–7.7)
Neutrophils Relative %: 63.4 % (ref 43.0–77.0)
Platelets: 207 10*3/uL (ref 150.0–400.0)
RBC: 4.66 Mil/uL (ref 4.22–5.81)
RDW: 13.4 % (ref 11.5–15.5)
WBC: 6.6 10*3/uL (ref 4.0–10.5)

## 2022-07-03 LAB — HEMOGLOBIN A1C: Hgb A1c MFr Bld: 6.1 % (ref 4.6–6.5)

## 2022-07-03 LAB — TSH: TSH: 3.56 u[IU]/mL (ref 0.35–5.50)

## 2022-07-03 NOTE — Assessment & Plan Note (Addendum)
Chronic Follows with cardiology No symptoms consistent with angina Continue Crestor 40 mg daily, aspirin 81 mg daily, Imdur 30 mg daily Check CBC, CMP

## 2022-07-03 NOTE — Assessment & Plan Note (Signed)
Chronic Check a1c Low sugar / carb diet Stressed regular exercise  

## 2022-07-03 NOTE — Assessment & Plan Note (Signed)
Chronic Regular exercise and healthy diet encouraged Check lipid panel  Continue Crestor 40 mg daily 

## 2022-07-03 NOTE — Assessment & Plan Note (Addendum)
Chronic  Clinically euthyroid Check tsh Continue levothyroxine 25 mcg daily-will adjust dose if necessary

## 2022-07-04 LAB — LIPID PANEL
Cholesterol: 240 mg/dL — ABNORMAL HIGH (ref 0–200)
HDL: 45 mg/dL (ref >39.00)
Total CHOL/HDL Ratio: 5
Triglycerides: 577 mg/dL — ABNORMAL HIGH (ref 0.0–149.0)

## 2022-07-04 LAB — COMPREHENSIVE METABOLIC PANEL
ALT: 18 U/L (ref 0–53)
AST: 21 U/L (ref 0–37)
Albumin: 4.3 g/dL (ref 3.5–5.2)
Alkaline Phosphatase: 54 U/L (ref 39–117)
BUN: 17 mg/dL (ref 6–23)
CO2: 27 mEq/L (ref 19–32)
Calcium: 10 mg/dL (ref 8.4–10.5)
Chloride: 103 mEq/L (ref 96–112)
Creatinine, Ser: 1.12 mg/dL (ref 0.40–1.50)
GFR: 66.38 mL/min (ref >60.00)
Glucose, Bld: 88 mg/dL (ref 70–99)
Potassium: 4.3 mEq/L (ref 3.5–5.1)
Sodium: 142 mEq/L (ref 135–145)
Total Bilirubin: 0.4 mg/dL (ref 0.2–1.2)
Total Protein: 7.1 g/dL (ref 6.0–8.3)

## 2022-07-04 LAB — LDL CHOLESTEROL, DIRECT: Direct LDL: 129 mg/dL

## 2022-07-09 ENCOUNTER — Encounter: Payer: Self-pay | Admitting: Internal Medicine

## 2022-07-09 DIAGNOSIS — E785 Hyperlipidemia, unspecified: Secondary | ICD-10-CM

## 2022-07-10 MED ORDER — EZETIMIBE 10 MG PO TABS: 10.0000 mg | ORAL_TABLET | Freq: Every day | ORAL | 3 refills | Status: DC

## 2022-07-10 NOTE — Addendum Note (Signed)
Addended by: Binnie Rail on: 07/10/2022 09:00 PM   Modules accepted: Orders

## 2022-08-06 ENCOUNTER — Other Ambulatory Visit: Payer: Self-pay | Admitting: Cardiology

## 2022-08-07 ENCOUNTER — Telehealth: Payer: Self-pay | Admitting: Cardiology

## 2022-08-07 NOTE — Telephone Encounter (Signed)
*  STAT* If patient is at the pharmacy, call can be transferred to refill team.   1. Which medications need to be refilled? (please list name of each medication and dose if known)  isosorbide mononitrate (IMDUR) 30 MG 24 hr tablet   2. Which pharmacy/location (including street and city if local pharmacy) is medication to be sent to? [id number] - [place], [place] [address] AT [place]   3. Do they need a 30 day or 90 day supply? 90 day  Patient is out of medication. Has an appt scheduled for 10/01/22

## 2022-08-08 ENCOUNTER — Other Ambulatory Visit: Payer: Self-pay | Admitting: *Deleted

## 2022-08-08 MED ORDER — ISOSORBIDE MONONITRATE ER 30 MG PO TB24: 30.0000 mg | ORAL_TABLET | Freq: Every day | ORAL | 1 refills | Status: DC

## 2022-08-08 NOTE — Telephone Encounter (Signed)
Pt scheduled to see Dr. Stanford Breed 10/01/22, refill for Isosorbide has been sent in to [facility].

## 2022-09-09 ENCOUNTER — Other Ambulatory Visit: Payer: Self-pay | Admitting: Internal Medicine

## 2022-09-24 NOTE — Progress Notes (Signed)
HPI: FU coronary disease. Patient had coronary artery bypass and graft in 2002. His last cardiac catheterization in 2003 revealed patent grafts but there was a stenosis in a posterior lateral branch that was not grafted.  Nuclear study July 2022 showed ejection fraction 51%, mild to moderate ischemia in the mid to apical inferior wall.  I did review this and felt it was low risk.  Patient has been treated medically.  Since he was last seen, the patient has dyspnea with more extreme activities but not with routine activities. It is relieved with rest. It is not associated with chest pain. There is no orthopnea, PND or pedal edema. There is no syncope or palpitations. There is no exertional chest pain.   Current Outpatient Medications  Medication Sig Dispense Refill   aspirin 81 MG EC tablet Take 81 mg by mouth daily.      diphenhydrAMINE (BENADRYL) 25 MG tablet Take 25 mg by mouth every 6 (six) hours as needed.     ezetimibe (ZETIA) 10 MG tablet Take 1 tablet (10 mg total) by mouth daily. 90 tablet 3   isosorbide mononitrate (IMDUR) 30 MG 24 hr tablet Take 1 tablet (30 mg total) by mouth daily. Patient must schedule annual office visit for future refills second attempt 30 tablet 1   loratadine (CLARITIN) 10 MG tablet Take 10 mg by mouth daily.     metFORMIN (GLUCOPHAGE) 500 MG tablet Take 1 tablet (500 mg total) by mouth 2 (two) times daily with a meal. 180 tablet 3   rosuvastatin (CRESTOR) 40 MG tablet Take 1 tablet (40 mg total) by mouth daily. Please schedule appointment with cardiologist for further refills. 1st attempt. 30 tablet 1   No current facility-administered medications for this visit.     Past Medical History:  Diagnosis Date   Acute asthmatic bronchitis    Allergy    pollen   BPH (benign prostatic hypertrophy)    mild   C. difficile colitis    CAD (coronary artery disease)    Colitis    Difficulty urinating 2017   self cath for all urination   Fatty liver     Hyperlipemia    Hypothyroidism    IBS (irritable bowel syndrome)    Nonspecific colitis    Rhinitis, allergic     Past Surgical History:  Procedure Laterality Date   CATARACT EXTRACTION, BILATERAL Bilateral 2016   COLONOSCOPY  2010   CORONARY ARTERY BYPASS GRAFT  2002   EYE SURGERY     LUMBAR LAMINECTOMY  1990    Social History   Socioeconomic History   Marital status: Married    Spouse name: Not on file   Number of children: 2   Years of education: Not on file   Highest education level: Not on file  Occupational History   Occupation: Retired  Tobacco Use   Smoking status: Never   Smokeless tobacco: Never  Vaping Use   Vaping Use: Never used  Substance and Sexual Activity   Alcohol use: Yes    Alcohol/week: 3.0 standard drinks of alcohol    Types: 3 Standard drinks or equivalent per week    Comment: occasionally   Drug use: No   Sexual activity: Yes  Other Topics Concern   Not on file  Social History Narrative   Not on file   Social Determinants of Health   Financial Resource Strain: Low Risk  (11/05/2021)   Overall Financial Resource Strain (CARDIA)    Difficulty  of Paying Living Expenses: Not hard at all  Food Insecurity: No Food Insecurity (11/05/2021)   Hunger Vital Sign    Worried About Running Out of Food in the Last Year: Never true    Ran Out of Food in the Last Year: Never true  Transportation Needs: No Transportation Needs (11/05/2021)   PRAPARE - Administrator, Civil Service (Medical): No    Lack of Transportation (Non-Medical): No  Physical Activity: Sufficiently Active (11/05/2021)   Exercise Vital Sign    Days of Exercise per Week: 5 days    Minutes of Exercise per Session: 30 min  Stress: No Stress Concern Present (11/05/2021)   [employer] of Occupational Health - Occupational Stress Questionnaire    Feeling of Stress : Not at all  Social Connections: Socially Integrated (11/05/2021)   Social Connection and Isolation Panel  [NHANES]    Frequency of Communication with Friends and Family: More than three times a week    Frequency of Social Gatherings with Friends and Family: More than three times a week    Attends Religious Services: More than 4 times per year    Active Member of [employer] or Organizations: Yes    Attends Engineer, structural: More than 4 times per year    Marital Status: Married  Catering manager Violence: Not At Risk (11/05/2021)   Humiliation, Afraid, Rape, and Kick questionnaire    Fear of Current or Ex-Partner: No    Emotionally Abused: No    Physically Abused: No    Sexually Abused: No    Family History  Problem Relation Age of Onset   Colon polyps Father    Coronary artery disease Father    Heart disease Father    Diabetes Father    Hyperlipidemia Father    Coronary artery disease Mother    Mental illness Mother    Colon cancer Paternal Aunt    Irritable bowel syndrome Neg Hx    Stroke Neg Hx    Alcohol abuse Neg Hx    Cancer Neg Hx    Depression Neg Hx    Early death Neg Hx    Hypertension Neg Hx    Esophageal cancer Neg Hx    Rectal cancer Neg Hx    Stomach cancer Neg Hx     ROS: no fevers or chills, productive cough, hemoptysis, dysphasia, odynophagia, melena, hematochezia, dysuria, hematuria, rash, seizure activity, orthopnea, PND, pedal edema, claudication. Remaining systems are negative.  Physical Exam: Well-developed well-nourished in no acute distress.  Skin is warm and dry.  HEENT is normal.  Neck is supple.  Chest is clear to auscultation with normal expansion.  Cardiovascular exam is regular rate and rhythm.  Abdominal exam nontender or distended. No masses palpated. Extremities show no edema. neuro grossly intact  ECG-sinus bradycardia at a rate of 57, RV conduction delay.  Personally reviewed  A/P  1 coronary artery disease-patient does not have exertional chest pain.  Electrocardiogram without new ST changes.  Most recent nuclear study showed  inferior ischemia but was felt to be low risk.  Plan to continue medical therapy.  Continue aspirin and statin.  2 hyperlipidemia-last LDL February 2024 129.  Continue Crestor and Zetia.  Check lipids and liver.  If LDL not at goal we will consider PCSK9 inhibitor.  Olga Millers, MD

## 2022-10-01 ENCOUNTER — Ambulatory Visit: Payer: Medicare PPO | Attending: Cardiology | Admitting: Cardiology

## 2022-10-01 ENCOUNTER — Encounter: Payer: Self-pay | Admitting: Cardiology

## 2022-10-01 VITALS — BP 130/84 | HR 92 | Ht 68.0 in | Wt 172.4 lb

## 2022-10-01 DIAGNOSIS — I25810 Atherosclerosis of coronary artery bypass graft(s) without angina pectoris: Secondary | ICD-10-CM | POA: Diagnosis not present

## 2022-10-01 DIAGNOSIS — E785 Hyperlipidemia, unspecified: Secondary | ICD-10-CM

## 2022-10-01 NOTE — Patient Instructions (Signed)
    Follow-Up: At [facility], you and your health needs are our priority.  As part of our continuing mission to provide you with exceptional heart care, we have created designated Provider Care Teams.  These Care Teams include your primary Cardiologist (physician) and Advanced Practice Providers (APPs -  Physician Assistants and Nurse Practitioners) who all work together to provide you with the care you need, when you need it.  We recommend signing up for the patient portal called "MyChart".  Sign up information is provided on this After Visit Summary.  MyChart is used to connect with patients for Virtual Visits (Telemedicine).  Patients are able to view lab/test results, encounter notes, upcoming appointments, etc.  Non-urgent messages can be sent to your provider as well.   To learn more about what you can do with MyChart, go to [email or web address].    Your next appointment:   12 month(s)  Provider:   Brian Crenshaw, MD     

## 2022-10-04 ENCOUNTER — Encounter: Payer: Self-pay | Admitting: Cardiology

## 2022-10-04 ENCOUNTER — Other Ambulatory Visit (INDEPENDENT_AMBULATORY_CARE_PROVIDER_SITE_OTHER): Payer: Medicare PPO

## 2022-10-04 ENCOUNTER — Encounter: Payer: Self-pay | Admitting: Internal Medicine

## 2022-10-04 DIAGNOSIS — E785 Hyperlipidemia, unspecified: Secondary | ICD-10-CM

## 2022-10-04 LAB — HEPATIC FUNCTION PANEL
ALT: 22 U/L (ref 0–53)
AST: 22 U/L (ref 0–37)
Albumin: 3.9 g/dL (ref 3.5–5.2)
Alkaline Phosphatase: 44 U/L (ref 39–117)
Bilirubin, Direct: 0.1 mg/dL (ref 0.0–0.3)
Total Bilirubin: 0.4 mg/dL (ref 0.2–1.2)
Total Protein: 6.7 g/dL (ref 6.0–8.3)

## 2022-10-04 LAB — LIPID PANEL
Cholesterol: 79 mg/dL (ref 0–200)
HDL: 46.4 mg/dL (ref >39.00)
LDL Cholesterol: 11 mg/dL (ref 0–99)
NonHDL: 32.88
Total CHOL/HDL Ratio: 2
Triglycerides: 109 mg/dL (ref 0.0–149.0)
VLDL: 21.8 mg/dL (ref 0.0–40.0)

## 2022-10-09 ENCOUNTER — Other Ambulatory Visit: Payer: Self-pay | Admitting: *Deleted

## 2022-10-09 MED ORDER — ISOSORBIDE MONONITRATE ER 30 MG PO TB24: 30.0000 mg | ORAL_TABLET | Freq: Every day | ORAL | 11 refills | Status: DC

## 2022-10-31 ENCOUNTER — Other Ambulatory Visit: Payer: Self-pay | Admitting: Internal Medicine

## 2022-11-05 DIAGNOSIS — L812 Freckles: Secondary | ICD-10-CM | POA: Diagnosis not present

## 2022-11-05 DIAGNOSIS — L821 Other seborrheic keratosis: Secondary | ICD-10-CM | POA: Diagnosis not present

## 2022-11-05 DIAGNOSIS — L570 Actinic keratosis: Secondary | ICD-10-CM | POA: Diagnosis not present

## 2022-11-05 DIAGNOSIS — D225 Melanocytic nevi of trunk: Secondary | ICD-10-CM | POA: Diagnosis not present

## 2022-11-08 ENCOUNTER — Encounter: Payer: Self-pay | Admitting: *Deleted

## 2022-12-03 ENCOUNTER — Other Ambulatory Visit: Payer: Self-pay

## 2022-12-03 MED ORDER — ROSUVASTATIN CALCIUM 40 MG PO TABS: 40.0000 mg | ORAL_TABLET | Freq: Every day | ORAL | 3 refills | Status: DC

## 2023-01-10 DIAGNOSIS — R339 Retention of urine, unspecified: Secondary | ICD-10-CM | POA: Diagnosis not present

## 2023-03-05 DIAGNOSIS — Z961 Presence of intraocular lens: Secondary | ICD-10-CM | POA: Diagnosis not present

## 2023-03-05 DIAGNOSIS — H43822 Vitreomacular adhesion, left eye: Secondary | ICD-10-CM | POA: Diagnosis not present

## 2023-03-05 DIAGNOSIS — H18613 Keratoconus, stable, bilateral: Secondary | ICD-10-CM | POA: Diagnosis not present

## 2023-03-05 DIAGNOSIS — H5203 Hypermetropia, bilateral: Secondary | ICD-10-CM | POA: Diagnosis not present

## 2023-03-05 DIAGNOSIS — H52223 Regular astigmatism, bilateral: Secondary | ICD-10-CM | POA: Diagnosis not present

## 2023-03-05 DIAGNOSIS — H524 Presbyopia: Secondary | ICD-10-CM | POA: Diagnosis not present

## 2023-03-05 DIAGNOSIS — E119 Type 2 diabetes mellitus without complications: Secondary | ICD-10-CM | POA: Diagnosis not present

## 2023-06-09 DIAGNOSIS — R339 Retention of urine, unspecified: Secondary | ICD-10-CM | POA: Diagnosis not present

## 2023-07-02 DIAGNOSIS — R8290 Unspecified abnormal findings in urine: Secondary | ICD-10-CM | POA: Diagnosis not present

## 2023-07-02 DIAGNOSIS — N401 Enlarged prostate with lower urinary tract symptoms: Secondary | ICD-10-CM | POA: Diagnosis not present

## 2023-08-11 ENCOUNTER — Telehealth: Payer: Self-pay | Admitting: Cardiology

## 2023-08-11 NOTE — Telephone Encounter (Signed)
 Pt c/o Shortness Of Breath: STAT if SOB developed within the last 24 hours or pt is noticeably SOB on the phone  1. Are you currently SOB (can you hear that pt is SOB on the phone)? No  2. How long have you been experiencing SOB? Last week  3. Are you SOB when sitting or when up moving around? Moving around doing elevation  4. Are you currently experiencing any other symptoms? Fatigue

## 2023-08-11 NOTE — Telephone Encounter (Signed)
 Left message with call back number- returning call about shortness of breath

## 2023-08-11 NOTE — Telephone Encounter (Signed)
 Increased shortness of breath going up the steps. It has been increasing over the past week. Walks 5-6 miles a day, but just noticed getting winded/short of breath with elevation (steps). Occasionally will have a little pinching in his heart area- comes and goes quickly with activity. No other symptoms reported.  Appointment made with Azalee Course, PA for 08/14/23. Given ER precautions. He verbalized understanding.

## 2023-08-12 ENCOUNTER — Encounter: Payer: Self-pay | Admitting: Internal Medicine

## 2023-08-12 NOTE — Progress Notes (Unsigned)
    Subjective:    Patient ID: Samuel Flowers, male    DOB: Oct 05, 1950, 73 y.o.   MRN: [id number]      HPI Samuel Flowers is here for No chief complaint on file.   Weight gain, SOB and fatigue -      Medications and allergies reviewed with patient and updated if appropriate.  Current Outpatient Medications on File Prior to Visit  Medication Sig Dispense Refill   aspirin 81 MG EC tablet Take 81 mg by mouth daily.      diphenhydrAMINE (BENADRYL) 25 MG tablet Take 25 mg by mouth every 6 (six) hours as needed.     ezetimibe (ZETIA) 10 MG tablet Take 1 tablet (10 mg total) by mouth daily. 90 tablet 3   isosorbide mononitrate (IMDUR) 30 MG 24 hr tablet Take 1 tablet (30 mg total) by mouth daily. 30 tablet 11   loratadine (CLARITIN) 10 MG tablet Take 10 mg by mouth daily.     metFORMIN (GLUCOPHAGE) 500 MG tablet TAKE 1 TABLET(500 MG) BY MOUTH TWICE DAILY WITH A MEAL 180 tablet 2   rosuvastatin (CRESTOR) 40 MG tablet Take 1 tablet (40 mg total) by mouth daily. 90 tablet 3   No current facility-administered medications on file prior to visit.    Review of Systems     Objective:  There were no vitals filed for this visit. BP Readings from Last 3 Encounters:  10/01/22 130/84  07/03/22 130/70  11/28/21 118/60   Wt Readings from Last 3 Encounters:  10/01/22 172 lb 6.4 oz (78.2 kg)  07/03/22 172 lb (78 kg)  11/28/21 167 lb (75.8 kg)   There is no height or weight on file to calculate BMI.    Physical Exam         Assessment & Plan:    See Problem List for Assessment and Plan of chronic medical problems.

## 2023-08-12 NOTE — Patient Instructions (Incomplete)
      Blood work was ordered.   Have a chest xray.      Medications changes include :   None

## 2023-08-13 ENCOUNTER — Encounter: Payer: Self-pay | Admitting: Internal Medicine

## 2023-08-13 ENCOUNTER — Ambulatory Visit: Admitting: Internal Medicine

## 2023-08-13 ENCOUNTER — Ambulatory Visit (INDEPENDENT_AMBULATORY_CARE_PROVIDER_SITE_OTHER)

## 2023-08-13 VITALS — BP 126/70 | HR 80 | Ht 68.0 in | Wt 178.0 lb

## 2023-08-13 DIAGNOSIS — R0602 Shortness of breath: Secondary | ICD-10-CM | POA: Diagnosis not present

## 2023-08-13 DIAGNOSIS — R5383 Other fatigue: Secondary | ICD-10-CM | POA: Diagnosis not present

## 2023-08-13 DIAGNOSIS — R7303 Prediabetes: Secondary | ICD-10-CM | POA: Diagnosis not present

## 2023-08-13 DIAGNOSIS — E039 Hypothyroidism, unspecified: Secondary | ICD-10-CM | POA: Diagnosis not present

## 2023-08-13 DIAGNOSIS — E785 Hyperlipidemia, unspecified: Secondary | ICD-10-CM | POA: Diagnosis not present

## 2023-08-13 DIAGNOSIS — R1012 Left upper quadrant pain: Secondary | ICD-10-CM | POA: Diagnosis not present

## 2023-08-13 DIAGNOSIS — R3914 Feeling of incomplete bladder emptying: Secondary | ICD-10-CM | POA: Diagnosis not present

## 2023-08-13 DIAGNOSIS — N35011 Post-traumatic bulbous urethral stricture: Secondary | ICD-10-CM | POA: Diagnosis not present

## 2023-08-13 DIAGNOSIS — R140 Abdominal distension (gaseous): Secondary | ICD-10-CM | POA: Diagnosis not present

## 2023-08-13 DIAGNOSIS — R635 Abnormal weight gain: Secondary | ICD-10-CM | POA: Diagnosis not present

## 2023-08-13 DIAGNOSIS — I771 Stricture of artery: Secondary | ICD-10-CM | POA: Diagnosis not present

## 2023-08-13 LAB — COMPREHENSIVE METABOLIC PANEL WITH GFR
ALT: 24 U/L (ref 0–53)
AST: 24 U/L (ref 0–37)
Albumin: 4.3 g/dL (ref 3.5–5.2)
Alkaline Phosphatase: 48 U/L (ref 39–117)
BUN: 9 mg/dL (ref 6–23)
CO2: 30 meq/L (ref 19–32)
Calcium: 9.5 mg/dL (ref 8.4–10.5)
Chloride: 102 meq/L (ref 96–112)
Creatinine, Ser: 0.84 mg/dL (ref 0.40–1.50)
GFR: 87.44 mL/min (ref >60.00)
Glucose, Bld: 92 mg/dL (ref 70–99)
Potassium: 5.2 meq/L — ABNORMAL HIGH (ref 3.5–5.1)
Sodium: 138 meq/L (ref 135–145)
Total Bilirubin: 0.5 mg/dL (ref 0.2–1.2)
Total Protein: 7.2 g/dL (ref 6.0–8.3)

## 2023-08-13 LAB — CBC WITH DIFFERENTIAL/PLATELET
Basophils Absolute: 0 10*3/uL (ref 0.0–0.1)
Basophils Relative: 0.7 % (ref 0.0–3.0)
Eosinophils Absolute: 0.2 10*3/uL (ref 0.0–0.7)
Eosinophils Relative: 2.6 % (ref 0.0–5.0)
HCT: 44 % (ref 39.0–52.0)
Hemoglobin: 14.9 g/dL (ref 13.0–17.0)
Lymphocytes Relative: 24.9 % (ref 12.0–46.0)
Lymphs Abs: 1.7 10*3/uL (ref 0.7–4.0)
MCHC: 33.8 g/dL (ref 30.0–36.0)
MCV: 95.6 fl (ref 78.0–100.0)
Monocytes Absolute: 0.7 10*3/uL (ref 0.1–1.0)
Monocytes Relative: 9.7 % (ref 3.0–12.0)
Neutro Abs: 4.2 10*3/uL (ref 1.4–7.7)
Neutrophils Relative %: 62.1 % (ref 43.0–77.0)
Platelets: 211 10*3/uL (ref 150.0–400.0)
RBC: 4.61 Mil/uL (ref 4.22–5.81)
RDW: 13.7 % (ref 11.5–15.5)
WBC: 6.7 10*3/uL (ref 4.0–10.5)

## 2023-08-13 LAB — LIPID PANEL
Cholesterol: 80 mg/dL (ref 0–200)
HDL: 51.2 mg/dL (ref >39.00)
LDL Cholesterol: 6 mg/dL (ref 0–99)
NonHDL: 29.09
Total CHOL/HDL Ratio: 2
Triglycerides: 117 mg/dL (ref 0.0–149.0)
VLDL: 23.4 mg/dL (ref 0.0–40.0)

## 2023-08-13 LAB — AMYLASE: Amylase: 51 U/L (ref 27–131)

## 2023-08-13 LAB — TSH: TSH: 2.31 u[IU]/mL (ref 0.35–5.50)

## 2023-08-13 LAB — HEMOGLOBIN A1C: Hgb A1c MFr Bld: 6.2 % (ref 4.6–6.5)

## 2023-08-13 LAB — LIPASE: Lipase: 36 U/L (ref 11.0–59.0)

## 2023-08-13 LAB — BRAIN NATRIURETIC PEPTIDE: Pro B Natriuretic peptide (BNP): 44 pg/mL (ref 0.0–100.0)

## 2023-08-13 NOTE — Assessment & Plan Note (Signed)
 Acute Has been experiencing some abdominal bloating and has mild tenderness in the left mid abdomen CBC, CMP, amylase, lipase No change in bowels, no other concerning stomach symptoms

## 2023-08-13 NOTE — Assessment & Plan Note (Signed)
 Chronic  He is having some increased fatigue Check tsh Not currently on levothyroxine

## 2023-08-13 NOTE — Assessment & Plan Note (Signed)
 Chronic Continue exercise and healthy diet Check lipid panel  Continue Crestor 40 mg daily, Zetia 10 mg daily

## 2023-08-13 NOTE — Assessment & Plan Note (Signed)
 New States increased fatigue-has to have to take naps during the day Check CBC, CMP, TSH, BMP Still walking 5-6 miles a day without difficulty Does have some shortness of breath with steps To see cardiology tomorrow

## 2023-08-13 NOTE — Assessment & Plan Note (Signed)
 New Has been experiencing some upper abdominal bloating Also has some left mid quadrant tenderness that is very mild Possible small hernia left epigastric region-possible incision hernia CBC, CMP, amylase, lipase Depending on results and determine further evaluation

## 2023-08-13 NOTE — Assessment & Plan Note (Signed)
 New Since fall he has gained 8 pounds without obvious cause He states he still walking 6 miles a day He states eating has not changed He does like sweets so that may be contributing Has had a little shortness of breath more with stairs which could be related to weight gain versus cardiac issue CBC, CMP, TSH, BNP To see cardiology tomorrow

## 2023-08-13 NOTE — Assessment & Plan Note (Signed)
Chronic Check a1c Low sugar / carb diet Stressed regular exercise  

## 2023-08-13 NOTE — Assessment & Plan Note (Signed)
 New Having SOB with steps but is able to walk 5-6 miles on a flat surface without difficulty BNP, CBC, CMP, TSH Chest x-ray

## 2023-08-14 ENCOUNTER — Ambulatory Visit: Attending: Physician Assistant | Admitting: Physician Assistant

## 2023-08-14 ENCOUNTER — Encounter: Payer: Self-pay | Admitting: Physician Assistant

## 2023-08-14 ENCOUNTER — Encounter: Payer: Self-pay | Admitting: Internal Medicine

## 2023-08-14 VITALS — BP 110/70 | HR 50 | Ht 68.0 in | Wt 175.0 lb

## 2023-08-14 DIAGNOSIS — E785 Hyperlipidemia, unspecified: Secondary | ICD-10-CM | POA: Diagnosis not present

## 2023-08-14 DIAGNOSIS — R0602 Shortness of breath: Secondary | ICD-10-CM

## 2023-08-14 DIAGNOSIS — E875 Hyperkalemia: Secondary | ICD-10-CM

## 2023-08-14 DIAGNOSIS — I25810 Atherosclerosis of coronary artery bypass graft(s) without angina pectoris: Secondary | ICD-10-CM

## 2023-08-14 DIAGNOSIS — R079 Chest pain, unspecified: Secondary | ICD-10-CM

## 2023-08-14 NOTE — Progress Notes (Unsigned)
  Cardiology Office Note:  .   Date:  08/14/2023  ID:  Samuel Flowers, DOB May 21, 1950, MRN [id number] PCP: Pincus Sanes, MD  [facility] Providers Cardiologist:  Olga Millers, MD { Click to update primary MD,subspecialty MD or APP then REFRESH:1}   History of Present Illness: .   Samuel Flowers is a 73 y.o. male with past medical history of CAD s/p CABG 2002 and hyperlipidemia.  Last cardiac catheterization in 2003 revealed patent grafts but there was stenosis in the posterolateral branch that was not grafted.  Myoview in July 2022 showed EF 51%, mild to moderate ischemia in the mid to apical inferior wall.  This was reviewed by Dr. Jens Som who felt it was low risk.  Patient was treated medically.  He was last seen by Dr. Jens Som in May 2024 at which time he had dyspnea with more extreme activity but not with a routine activity.  Patient was seen by her PCP yesterday for fatigue and shortness of breath and weight gain.  Blood work obtained at that showed borderline high potassium of 5.2, normal renal function and sodium level.  BNP normal.  CBC shows normal red blood cell count and white blood cell count.  Hemoglobin A1c 6.2.  Amylase and lipase normal.  Lipid panel showed very well-controlled cholesterol with LDL of only 6.  TSH normal.  Chest x-ray showed no acute finding.  Patient presents today for follow-up.  He has been having increased episode of shortness of breath and fatigue lately.  He also has chronic episode of intermittent chest discomfort with heavy exercise.  On physical exam, he appears to be euvolemic.  He is on appropriate medications.  Recent blood work showed borderline high potassium, I recommended he repeat a basic metabolic panel in 1 week.  He is not on any medication that is capable raising potassium.  He will need echocardiogram for his shortness of breath and a PET stress test for his chest pain.  If study come back okay, he can follow-up in 12 months.  ROS:  ***  Studies Reviewed: .        *** Risk Assessment/Calculations:   {Does this patient have ATRIAL FIBRILLATION?:[phone]}         Physical Exam:   VS:  BP 110/70   Pulse (!) 50   Ht 5\' 8"  (1.727 m)   Wt 175 lb (79.4 kg)   SpO2 95%   BMI 26.61 kg/m    Wt Readings from Last 3 Encounters:  08/14/23 175 lb (79.4 kg)  08/13/23 178 lb (80.7 kg)  10/01/22 172 lb 6.4 oz (78.2 kg)    GEN: Well nourished, well developed in no acute distress NECK: No JVD; No carotid bruits CARDIAC: ***RRR, no murmurs, rubs, gallops RESPIRATORY:  Clear to auscultation without rales, wheezing or rhonchi  ABDOMEN: Soft, non-tender, non-distended EXTREMITIES:  No edema; No deformity   ASSESSMENT AND PLAN: .   ***    {Are you ordering a CV Procedure (e.g. stress test, cath, DCCV, TEE, etc)?   Press F2        :[id number]}  Dispo: ***  Signed, Azalee Course, PA

## 2023-08-14 NOTE — Patient Instructions (Signed)
 Medication Instructions:  RESTART ASPIRIN *If you need a refill on your cardiac medications before your next appointment, please call your pharmacy*  Lab Work: BMP IN 1 WEEK  If you have labs (blood work) drawn today and your tests are completely normal, you will receive your results only by: MyChart Message (if you have MyChart) OR A paper copy in the mail If you have any lab test that is abnormal or we need to change your treatment, we will call you to review the results.  Testing/Procedures:[address] Your physician has requested that you have an echocardiogram. Echocardiography is a painless test that uses sound waves to create images of your heart. It provides your doctor with information about the size and shape of your heart and how well your heart's chambers and valves are working. This procedure takes approximately one hour. There are no restrictions for this procedure. Please do NOT wear cologne, perfume, aftershave, or lotions (deodorant is allowed). Please arrive 15 minutes prior to your appointment time.  Please note: We ask at that you not bring children with you during ultrasound (echo/ vascular) testing. Due to room size and safety concerns, children are not allowed in the ultrasound rooms during exams. Our front office staff cannot provide observation of children in our lobby area while testing is being conducted. An adult accompanying a patient to their appointment will only be allowed in the ultrasound room at the discretion of the ultrasound technician under special circumstances. We apologize for any inconvenience.     Please report to Radiology at the [facility] Main Entrance 30 minutes early for your test.  [address] [place], [place] [address]  How to Prepare for Your Cardiac PET/CT Stress Test:  Nothing to eat or drink, except water, 3 hours prior to arrival time.  NO caffeine/decaffeinated products, or chocolate 12 hours prior to  arrival. (Please note decaffeinated beverages (teas/coffees) still contain caffeine).  If you have caffeine within 12 hours prior, the test will need to be rescheduled.  Medication instructions: Do not take erectile dysfunction medications for 72 hours prior to test (sildenafil, tadalafil) Do not take nitrates (isosorbide mononitrate, Ranexa) the day before or day of test Do not take tamsulosin the day before or morning of test Hold theophylline containing medications for 12 hours. Hold Dipyridamole 48 hours prior to the test.  Diabetic Preparation: If able to eat breakfast prior to 3 hour fasting, you may take all medications, including your insulin. Do not worry if you miss your breakfast dose of insulin - start at your next meal. If you do not eat prior to 3 hour fast-Hold all diabetes (oral and insulin) medications. Patients who wear a continuous glucose monitor MUST remove the device prior to scanning.  You may take your remaining medications with water.  NO perfume, cologne or lotion on chest or abdomen area. FEMALES - Please avoid wearing dresses to this appointment.  Total time is 1 to 2 hours; you may want to bring reading material for the waiting time.  IF YOU THINK YOU MAY BE PREGNANT, OR ARE NURSING PLEASE INFORM THE TECHNOLOGIST.  In preparation for your appointment, medication and supplies will be purchased.  Appointment availability is limited, so if you need to cancel or reschedule, please call the Radiology Department Scheduler at [phone] 24 hours in advance to avoid a cancellation fee of $100.00  What to Expect When you Arrive:  Once you arrive and check in for your appointment,  you will be taken to a preparation room within the Radiology Department.  A technologist or Nurse will obtain your medical history, verify that you are correctly prepped for the exam, and explain the procedure.  Afterwards, an IV will be started in your arm and electrodes will be placed on  your skin for EKG monitoring during the stress portion of the exam. Then you will be escorted to the PET/CT scanner.  There, staff will get you positioned on the scanner and obtain a blood pressure and EKG.  During the exam, you will continue to be connected to the EKG and blood pressure machines.  A small, safe amount of a radioactive tracer will be injected in your IV to obtain a series of pictures of your heart along with an injection of a stress agent.    After your Exam:  It is recommended that you eat a meal and drink a caffeinated beverage to counter act any effects of the stress agent.  Drink plenty of fluids for the remainder of the day and urinate frequently for the first couple of hours after the exam.  Your doctor will inform you of your test results within 7-10 business days.  For more information and frequently asked questions, please visit our website: [email or web address]  For questions about your test or how to prepare for your test, please call: Cardiac Imaging Nurse Navigators Office: [phone]    Follow-Up: At [facility], you and your health needs are our priority.  As part of our continuing mission to provide you with exceptional heart care, our providers are all part of one team.  This team includes your primary Cardiologist (physician) and Advanced Practice Providers or APPs (Physician Assistants and Nurse Practitioners) who all work together to provide you with the care you need, when you need it.  Your next appointment:   1 year(s)  Provider:   Olga Millers, MD    Other Instructions   1st Floor: - Lobby - Registration  - Pharmacy  - Lab - Cafe  2nd Floor: - PV Lab - Diagnostic Testing (echo, CT, nuclear med)  3rd Floor: - Vacant  4th Floor: - TCTS (cardiothoracic surgery) - AFib Clinic - Structural Heart Clinic - Vascular Surgery  - Vascular Ultrasound  5th Floor: - HeartCare Cardiology (general and EP) - Clinical  Pharmacy for coumadin, hypertension, lipid, weight-loss medications, and med management appointments    Valet parking services will be available as well.

## 2023-08-15 DIAGNOSIS — N312 Flaccid neuropathic bladder, not elsewhere classified: Secondary | ICD-10-CM | POA: Diagnosis not present

## 2023-08-15 DIAGNOSIS — N35011 Post-traumatic bulbous urethral stricture: Secondary | ICD-10-CM | POA: Diagnosis not present

## 2023-08-15 NOTE — Addendum Note (Signed)
 Addended byLisabeth Devoid, Kaiser Belluomini on: 08/15/2023 11:51 AM   Modules accepted: Orders

## 2023-08-16 MED ORDER — OZEMPIC (0.25 OR 0.5 MG/DOSE) 2 MG/3ML ~~LOC~~ SOPN: 0.2500 mg | PEN_INJECTOR | SUBCUTANEOUS | 0 refills | Status: DC

## 2023-09-02 ENCOUNTER — Encounter: Admitting: Internal Medicine

## 2023-09-05 DIAGNOSIS — R339 Retention of urine, unspecified: Secondary | ICD-10-CM | POA: Diagnosis not present

## 2023-09-08 ENCOUNTER — Other Ambulatory Visit (HOSPITAL_BASED_OUTPATIENT_CLINIC_OR_DEPARTMENT_OTHER)

## 2023-09-08 DIAGNOSIS — R0602 Shortness of breath: Secondary | ICD-10-CM | POA: Diagnosis not present

## 2023-09-08 LAB — ECHOCARDIOGRAM COMPLETE
Area-P 1/2: 4.15 cm2
S' Lateral: 2.28 cm

## 2023-09-18 ENCOUNTER — Other Ambulatory Visit: Payer: Self-pay | Admitting: Internal Medicine

## 2023-09-22 ENCOUNTER — Encounter (HOSPITAL_COMMUNITY): Payer: Self-pay

## 2023-09-23 ENCOUNTER — Telehealth (HOSPITAL_COMMUNITY): Payer: Self-pay | Admitting: *Deleted

## 2023-09-23 DIAGNOSIS — N312 Flaccid neuropathic bladder, not elsewhere classified: Secondary | ICD-10-CM | POA: Diagnosis not present

## 2023-09-23 DIAGNOSIS — N35011 Post-traumatic bulbous urethral stricture: Secondary | ICD-10-CM | POA: Diagnosis not present

## 2023-09-23 DIAGNOSIS — R9341 Abnormal radiologic findings on diagnostic imaging of renal pelvis, ureter, or bladder: Secondary | ICD-10-CM | POA: Diagnosis not present

## 2023-09-23 NOTE — Telephone Encounter (Signed)
 Reaching out to patient to offer assistance regarding upcoming cardiac imaging study; pt verbalizes understanding of appt date/time, parking situation and where to check in, pre-test NPO status and medications ordered, and verified current allergies; name and call back number provided for further questions should they arise Johney Frame RN Navigator Cardiac Imaging [facility] [phone] office [phone] cell  Patient aware to avoid caffeine for 12 hours prior to test and hold imdur.

## 2023-09-24 ENCOUNTER — Encounter: Payer: Self-pay | Admitting: Internal Medicine

## 2023-09-24 ENCOUNTER — Ambulatory Visit (HOSPITAL_COMMUNITY)
Admission: RE | Admit: 2023-09-24 | Discharge: 2023-09-24 | Disposition: A | Discharge status: Home / Self Care | Source: Ambulatory Visit | Attending: Physician Assistant | Admitting: Physician Assistant

## 2023-09-24 DIAGNOSIS — R079 Chest pain, unspecified: Secondary | ICD-10-CM | POA: Insufficient documentation

## 2023-09-24 DIAGNOSIS — R911 Solitary pulmonary nodule: Secondary | ICD-10-CM | POA: Diagnosis not present

## 2023-09-24 MED ORDER — RUBIDIUM RB82 GENERATOR (RUBYFILL)
20.5500 | PACK | Freq: Once | INTRAVENOUS | Status: AC
  Administered 2023-09-24: 20.55 via INTRAVENOUS

## 2023-09-24 MED ORDER — REGADENOSON 0.4 MG/5ML IV SOLN
INTRAVENOUS | Status: AC
  Filled 2023-09-24: qty 5

## 2023-09-24 MED ORDER — REGADENOSON 0.4 MG/5ML IV SOLN
0.4000 mg | Freq: Once | INTRAVENOUS | Status: AC
  Administered 2023-09-24: 0.4 mg via INTRAVENOUS

## 2023-09-25 ENCOUNTER — Encounter: Payer: Self-pay | Admitting: Cardiology

## 2023-09-25 LAB — NM PET CT CARDIAC PERFUSION MULTI W/ABSOLUTE BLOODFLOW
LV dias vol: 84 mL (ref 62–150)
LV sys vol: 30 mL
MBFR: 2.81
Nuc Rest EF: 59 %
Nuc Stress EF: 64 %
Peak HR: 69 {beats}/min
Rest HR: 52 {beats}/min
Rest MBF: 0.43 ml/g/min
Rest Nuclear Isotope Dose: 20.6 mCi
ST Depression (mm): 0 mm
Stress MBF: 1.21 ml/g/min
Stress Nuclear Isotope Dose: 20.6 mCi
TID: 1.04

## 2023-09-25 MED ORDER — OZEMPIC (0.25 OR 0.5 MG/DOSE) 2 MG/3ML ~~LOC~~ SOPN: 0.5000 mg | PEN_INJECTOR | SUBCUTANEOUS | 0 refills | Status: DC

## 2023-09-25 NOTE — Progress Notes (Unsigned)
   Acute Office Visit  Subjective:     Patient ID: Samuel Flowers, male    DOB: March 19, 1951, 73 y.o.   MRN: [id number]  No chief complaint on file.   HPI Patient is in today for ***  ROS Per HPI      Objective:    There were no vitals taken for this visit.   Physical Exam Vitals and nursing note reviewed.  Constitutional:      General: He is not in acute distress.    Appearance: Normal appearance.  HENT:     Head: Normocephalic and atraumatic.     Right Ear: External ear normal.     Left Ear: External ear normal.     Nose: Nose normal.     Mouth/Throat:     Mouth: Mucous membranes are moist.     Pharynx: Oropharynx is clear.  Eyes:     Extraocular Movements: Extraocular movements intact.  Cardiovascular:     Rate and Rhythm: Normal rate and regular rhythm.     Pulses: Normal pulses.     Heart sounds: Normal heart sounds.  Pulmonary:     Effort: Pulmonary effort is normal. No respiratory distress.     Breath sounds: Normal breath sounds. No wheezing, rhonchi or rales.  Musculoskeletal:        General: Normal range of motion.     Cervical back: Normal range of motion.     Right lower leg: No edema.     Left lower leg: No edema.  Lymphadenopathy:     Cervical: No cervical adenopathy.  Skin:    General: Skin is warm and dry.  Neurological:     General: No focal deficit present.     Mental Status: He is alert and oriented to person, place, and time.  Psychiatric:        Mood and Affect: Mood normal.        Behavior: Behavior normal.   No results found for any visits on 09/26/23.      Assessment & Plan:   There are no diagnoses linked to this encounter.   No orders of the defined types were placed in this encounter.   No follow-ups on file.  Wellington Half, FNP

## 2023-09-26 ENCOUNTER — Ambulatory Visit: Admitting: Family Medicine

## 2023-09-26 ENCOUNTER — Ambulatory Visit: Payer: Self-pay | Admitting: Physician Assistant

## 2023-09-26 ENCOUNTER — Encounter: Payer: Self-pay | Admitting: Family Medicine

## 2023-09-26 VITALS — BP 122/74 | HR 50 | Temp 97.9°F | Ht 68.0 in | Wt 173.8 lb

## 2023-09-26 DIAGNOSIS — H938X3 Other specified disorders of ear, bilateral: Secondary | ICD-10-CM

## 2023-09-26 DIAGNOSIS — H6123 Impacted cerumen, bilateral: Secondary | ICD-10-CM

## 2023-09-26 NOTE — Progress Notes (Signed)
 Will forward a copy to Dr. Audery Blazing, when compare to 2022 stress test, no significant change. Plan for medical therapy for small area of ischemia

## 2023-09-26 NOTE — Patient Instructions (Signed)
 We have cleaned out your ears in the office today.  You may use Debrox at home to try to help keep the wax from building up.  Follow-up with [place] as needed if you are having any persistent symptoms.

## 2023-09-29 NOTE — Telephone Encounter (Signed)
 I spoke with the patient, his symptom seems to be stable. We discussed the test result in detail, we also discussed possibility of either increase in imdur  vs continue observation. He would prefer continue observation for now since his symptom is controlled. Destany, please arrange 2-3 month follow up with me or Dr. Audery Blazing to reassess his symptom. Patient is aware that he will receive a phone call from us .

## 2023-10-06 ENCOUNTER — Telehealth: Payer: Self-pay | Admitting: Physician Assistant

## 2023-10-06 ENCOUNTER — Encounter: Payer: Self-pay | Admitting: Cardiology

## 2023-10-06 ENCOUNTER — Other Ambulatory Visit: Payer: Self-pay | Admitting: Physician Assistant

## 2023-10-06 DIAGNOSIS — K838 Other specified diseases of biliary tract: Secondary | ICD-10-CM | POA: Diagnosis not present

## 2023-10-06 DIAGNOSIS — K8020 Calculus of gallbladder without cholecystitis without obstruction: Secondary | ICD-10-CM | POA: Diagnosis not present

## 2023-10-06 DIAGNOSIS — Z79899 Other long term (current) drug therapy: Secondary | ICD-10-CM | POA: Diagnosis not present

## 2023-10-06 DIAGNOSIS — R079 Chest pain, unspecified: Secondary | ICD-10-CM | POA: Diagnosis not present

## 2023-10-06 DIAGNOSIS — R001 Bradycardia, unspecified: Secondary | ICD-10-CM | POA: Diagnosis not present

## 2023-10-06 DIAGNOSIS — R0789 Other chest pain: Secondary | ICD-10-CM | POA: Diagnosis not present

## 2023-10-06 DIAGNOSIS — N3289 Other specified disorders of bladder: Secondary | ICD-10-CM | POA: Diagnosis not present

## 2023-10-06 DIAGNOSIS — I25810 Atherosclerosis of coronary artery bypass graft(s) without angina pectoris: Secondary | ICD-10-CM

## 2023-10-06 MED ORDER — NITROGLYCERIN 0.4 MG SL SUBL: 0.4000 mg | SUBLINGUAL_TABLET | SUBLINGUAL | 3 refills | Status: DC | PRN

## 2023-10-06 MED ORDER — NITROGLYCERIN 0.4 MG SL SUBL: 0.4000 mg | SUBLINGUAL_TABLET | SUBLINGUAL | 3 refills | Status: AC | PRN

## 2023-10-06 NOTE — Addendum Note (Signed)
 Addended by: Dorothea Gata on: 10/06/2023 04:40 PM   Modules accepted: Orders

## 2023-10-06 NOTE — Progress Notes (Signed)
 Sent NTG script

## 2023-10-06 NOTE — Telephone Encounter (Signed)
 Patient called stating that he had chest pain this morning.  He took 30 mg of Imdur  at 2 AM and then took an additional 30 mg of Imdur  at approximately 7 AM.  Due to 7 out of 10 chest pain he proceeded to be evaluated at the ER with [facility] in [place] .  Fortunately he ruled out with negative troponin.  He was discharged without admission and advised to return if further chest pain.  His initial question was max dose of Imdur .  With further discussion and his history of CABG 20 years ago, I advised that if he had progressive chest pain this evening he should go back to the ER for possible heart catheterization.  We did review his recent nuclear stress test.  He reports that prior to the PET stress he was not having this type of chest pain/shoulder blade pain.  It sounds as though symptoms may be progressive.  We reviewed ER precautions.  Regardless of what happens in the next couple of days, I do think that he needs to be seen in our office.  Triage can you please arrange follow-up appointment for this patient in the next week.  I sent in sublingual nitroglycerin to a [place] [facility].

## 2023-10-07 ENCOUNTER — Encounter: Payer: Self-pay | Admitting: Emergency Medicine

## 2023-10-07 ENCOUNTER — Telehealth: Payer: Self-pay | Admitting: *Deleted

## 2023-10-07 ENCOUNTER — Ambulatory Visit: Attending: Emergency Medicine | Admitting: Emergency Medicine

## 2023-10-07 ENCOUNTER — Encounter: Payer: Self-pay | Admitting: Internal Medicine

## 2023-10-07 VITALS — BP 114/66 | HR 56 | Ht 68.0 in | Wt 173.0 lb

## 2023-10-07 DIAGNOSIS — E785 Hyperlipidemia, unspecified: Secondary | ICD-10-CM | POA: Diagnosis not present

## 2023-10-07 DIAGNOSIS — I25810 Atherosclerosis of coronary artery bypass graft(s) without angina pectoris: Secondary | ICD-10-CM

## 2023-10-07 DIAGNOSIS — R079 Chest pain, unspecified: Secondary | ICD-10-CM

## 2023-10-07 MED ORDER — ISOSORBIDE MONONITRATE ER 60 MG PO TB24: 60.0000 mg | ORAL_TABLET | Freq: Every day | ORAL | 3 refills | Status: AC

## 2023-10-07 NOTE — H&P (View-Only) (Signed)
 Cardiology Office Note:    Date:  10/07/2023  ID:  Samuel Flowers, DOB 01/21/1951, MRN [id number] PCP: Samuel Dauphin, MD  [facility] Providers Cardiologist:  Samuel Angel, MD       Patient Profile:       Chief Complaint: Acute visit for chest pains History of Present Illness:  Samuel Flowers is a 73 y.o. male with visit-pertinent history of CAD s/p CABG in 2002, hyperlipidemia  Patient had a coronary artery bypass and graft in 2002.  His last cardiac catheterization in 2003 revealed patent grafts but there was stenosis in the posterior lateral branch that was not grafted.  Myoview  in July 2022 showed LVEF 51%, mild to moderate ischemia in the mid to apical inferior wall.  This was reviewed by Samuel Flowers who felt it was low risk.  Patient was treated medically. He was seen by Samuel Flowers May 2024 at which time he had dyspnea with more extreme activity but not with routine activity.    Patient was last seen in clinic on 08/14/2023 by Samuel Flowers.  He was been having increased episodes of shortness of breath and fatigue.  He also noted chronic episodes of intermittent chest discomfort with heavy exercise.  Echocardiogram was ordered and completed on 09/08/2023 showing normal LVEF of 60 to 65%, no RWMA, grade 2 DD, normal RV function and size, mild to moderate MR.  Cardiac PET/CT was ordered and completed on 08/14/2023 showing findings consistent with small area of basal inferolateral ischemia.  This study is low risk due to small defect size, normal MBFR and augmentation of EF with stress.  There was no evidence of infarction.  He was recently seen in the ED yesterday for chest pain.  He had a normal ED workup.  EKG showed no acute ST/T wave changes and troponin was normal.  CT PE showed no PE.   Discussed the use of AI scribe software for clinical note transcription with the patient, who gave verbal consent to proceed.  History of Present Illness Samuel Flowers is a 73 year old male with  coronary artery disease and prior bypass surgery who presents with severe chest and shoulder pain.  He experiences severe chest pain that began two days ago, radiating to the shoulder blade and left arm. Pain intensity was seven to eight out of ten yesterday, leading to an emergency department visit.  The pain is left-sided.  The pain worsens on exertion and is slightly relieved by rest.  He takes Imdur , recently increasing the dose from 30 mg to 60 mg, which decreased pain intensity. He denies current fatigue and shortness of breath.  No dyspnea, orthopnea, PND, palpitations, lower extremity swelling, fatigue, lightheadedness, dizziness, syncope.  Review of systems:  Please see the history of present illness. All other systems are reviewed and otherwise negative.      Studies Reviewed:    EKG Interpretation Date/Time:  Tuesday Oct 07 2023 13:41:10 EDT Ventricular Rate:  56 PR Interval:  180 QRS Duration:  84 QT Interval:  422 QTC Calculation: 407 R Axis:   24  Text Interpretation: Sinus bradycardia Nonspecific ST abnormality When compared with ECG of 14-Aug-2023 08:54, PR interval has decreased Confirmed by Samuel Flowers [phone]) on 10/07/2023 2:05:33 PM    Cardiac PET/CT 08/14/2023   Findings are consistent with small area of basal inferolateral ischemia. The study is low risk due to small defect size, normal MBFR and augmentation of EF with stress.   LV perfusion is abnormal. There is  evidence of ischemia. There is no evidence of infarction. Defect 1: There is a small defect with mild reduction in uptake present in the basal inferior and inferolateral location(s) that is reversible. There is abnormal wall motion in the defect area. Consistent with ischemia.   Rest left ventricular function is normal. Rest EF: 59%. Stress left ventricular function is normal. Stress EF: 64%. End diastolic cavity size is normal. End systolic cavity size is normal. No evidence of transient ischemic dilation  (TID) noted.   Myocardial blood flow was computed to be 0.19ml/g/min at rest and 1.21ml/g/min at stress. Global myocardial blood flow reserve was 2.81 and was normal. Regionally abnormal CFR in the basal inferolateral distribution, but overall normal MBFR.   Coronary calcium  assessment not performed due to prior revascularization.   Electronically signed by: Samuel Herrlich, MD  Echocardiogram 09/08/2023 1. Left ventricular ejection fraction, by estimation, is 60 to 65%. Left  ventricular ejection fraction by 3D volume is 60 %. The left ventricle has  normal function. The left ventricle has no regional wall motion  abnormalities. Left ventricular diastolic   parameters are consistent with Grade II diastolic dysfunction  (pseudonormalization). The average left ventricular global longitudinal  strain is -17.8 %. The global longitudinal strain is normal.   2. Right ventricular systolic function is normal. The right ventricular  size is normal.   3. The mitral valve is normal in structure. Mild to moderate mitral valve  regurgitation. No evidence of mitral stenosis.   4. The aortic valve is tricuspid. Aortic valve regurgitation is not  visualized. No aortic stenosis is present.   5. The inferior vena cava is normal in size with greater than 50%  respiratory variability, suggesting right atrial pressure of 3 mmHg.   Risk Assessment/Calculations:              Physical Exam:   VS:  BP 114/66 (BP Location: Left Arm, Patient Position: Sitting, Cuff Size: Normal)   Pulse (!) 56   Ht 5\' 8"  (1.727 m)   Wt 173 lb (78.5 kg)   BMI 26.30 kg/m    Wt Readings from Last 3 Encounters:  10/07/23 173 lb (78.5 kg)  09/26/23 173 lb 12.8 oz (78.8 kg)  08/14/23 175 lb (79.4 kg)    GEN: Well nourished, well developed in no acute distress NECK: No JVD; No carotid bruits CARDIAC: RRR, no murmurs, rubs, gallops RESPIRATORY:  Clear to auscultation without rales, wheezing or rhonchi  ABDOMEN: Soft,  non-tender, non-distended EXTREMITIES:  No edema; No acute deformity      Assessment and Plan:  Coronary artery disease / Chest pain S/p CABG in 2002 Cardiac PET/CT 08/2023 consistent with small area of basal inferolateral ischemia that was low risk due to small defect size, normal MBFR and augmentation of EF of stress Echocardiogram 08/2023 with LVEF 60 to 65%, no RWMA, grade 2 DD - EKG today is without ischemic changes -Today patient comes in with ongoing severe chest pains.  Acutely worsened over the past 2 days.  Now pain seems to be worse and intermittent.  Pain tends to worsen on exertion and radiates to left shoulder blade and left arm. Pain is responsive to his imdur  by lowering pain from 7/10 to 3/10.  Was seen in the ED yesterday for CP and had unremarkable EKG and troponins - Spoke with DOD Dr. Alda Amas, who agreed for further ischemic evaluation with left heart catheterization given patient is on maximally tolerated antianginal therapy. He is currently on Imdur   60 mg daily and unable to be started on beta-blocking therapy due to bradycardia - Continue aspirin 81 mg daily, ezetimibe  10 mg daily, rosuvastatin  10 mg daily, nitroglycerin as needed, Imdur  60 mg daily  Patient is scheduled for left heart catheterization at 3 PM with Dr. Swaziland He is to hold his metformin  the day of and 48 hours after.  He is to take his aspirin tomorrow morning prior to cardiac cath.  Hyperlipidemia LDL 6, HDL 51, TG 117 on 08/2023 LDL under excellent control and under goal of less than 55 - Continue ezetimibe  10 mg daily and rosuvastatin  40 mg daily   Informed Consent   Shared Decision Making/Informed Consent The risks [stroke (1 in 1000), death (1 in 1000), kidney failure [usually temporary] (1 in 500), bleeding (1 in 200), allergic reaction [possibly serious] (1 in 200)], benefits (diagnostic support and management of coronary artery disease) and alternatives of a cardiac catheterization were discussed  in detail with Mr. Klecka and he is willing to proceed.     Dispo:  Return in about 3 weeks (around 10/28/2023).  Signed, Ava Boatman, NP

## 2023-10-07 NOTE — Patient Instructions (Signed)
 Medication Instructions:  NO CHANGES   Lab Work: BMET AND CBC TO BE DONE TODAY.   Testing/Procedures:  [facility] A DEPT OF [place] [address] A DEPT OF THE [place] [address] [place] [address] Dept: [phone] Loc: [phone]  Samuel Flowers  10/07/2023  You are scheduled for a Cardiac Catheterization on Wednesday, May 28 with Dr. Peter Swaziland.  1. Please arrive at the [facility] (Main Entrance A) at [facility]: [address] [place], [place] [address] at 1:00 PM (This time is 2 hour(s) before your procedure to ensure your preparation).   Free valet parking service is available. You will check in at ADMITTING. The support person will be asked to wait in the waiting room.  It is OK to have someone drop you off and come back when you are ready to be discharged.    Special note: Every effort is made to have your procedure done on time. Please understand that emergencies sometimes delay scheduled procedures.  2. Diet: Do not eat solid foods after midnight.  The patient may have clear liquids until 5am upon the day of the procedure.  3. Labs: You will need to have blood drawn on Tuesday, May 27 at [facility] (1st Floor), [address], [place], [place] [address]. You do not need to be fasting.  4. Medication instructions in preparation for your procedure:   Contrast Allergy: No  Do not take Diabetes Med Glucophage  (Metformin ) on the day of the procedure and HOLD 48 HOURS AFTER THE PROCEDURE.  On the morning of your procedure, take your Aspirin  81 mg and any morning medicines NOT listed above.  You may use sips of water.  5. Plan to go home the same day, you will only stay overnight if medically necessary. 6. Bring a current list of your medications and current insurance cards. 7. You MUST have a responsible person to drive you home. 8. Someone MUST  be with you the first 24 hours after you arrive home or your discharge will be delayed. 9. Please wear clothes that are easy to get on and off and wear slip-on shoes.  Thank you for allowing us  to care for you!   -- [facility]   Follow-Up: At [facility], you and your health needs are our priority.  As part of our continuing mission to provide you with exceptional heart care, our providers are all part of one team.  This team includes your primary Cardiologist (physician) and Advanced Practice Providers or APPs (Physician Assistants and Nurse Practitioners) who all work together to provide you with the care you need, when you need it.  Your next appointment:   2 -3 WEEKS POST CATH  Provider:   MADISON FOUNTAIN, DNP

## 2023-10-07 NOTE — Progress Notes (Signed)
 Cardiology Office Note:    Date:  10/07/2023  ID:  Samuel Flowers, DOB 01/21/1951, MRN [id number] PCP: Colene Dauphin, MD  [facility] Providers Cardiologist:  Alexandria Angel, MD       Patient Profile:       Chief Complaint: Acute visit for chest pains History of Present Illness:  Samuel Flowers is a 73 y.o. male with visit-pertinent history of CAD s/p CABG in 2002, hyperlipidemia  Patient had a coronary artery bypass and graft in 2002.  His last cardiac catheterization in 2003 revealed patent grafts but there was stenosis in the posterior lateral branch that was not grafted.  Myoview  in July 2022 showed LVEF 51%, mild to moderate ischemia in the mid to apical inferior wall.  This was reviewed by Dr. Audery Blazing who felt it was low risk.  Patient was treated medically. He was seen by Dr. Audery Blazing May 2024 at which time he had dyspnea with more extreme activity but not with routine activity.    Patient was last seen in clinic on 08/14/2023 by [place], [place].  He was been having increased episodes of shortness of breath and fatigue.  He also noted chronic episodes of intermittent chest discomfort with heavy exercise.  Echocardiogram was ordered and completed on 09/08/2023 showing normal LVEF of 60 to 65%, no RWMA, grade 2 DD, normal RV function and size, mild to moderate MR.  Cardiac PET/CT was ordered and completed on 08/14/2023 showing findings consistent with small area of basal inferolateral ischemia.  This study is low risk due to small defect size, normal MBFR and augmentation of EF with stress.  There was no evidence of infarction.  He was recently seen in the ED yesterday for chest pain.  He had a normal ED workup.  EKG showed no acute ST/T wave changes and troponin was normal.  CT PE showed no PE.   Discussed the use of AI scribe software for clinical note transcription with the patient, who gave verbal consent to proceed.  History of Present Illness Samuel Flowers is a 73 year old male with  coronary artery disease and prior bypass surgery who presents with severe chest and shoulder pain.  He experiences severe chest pain that began two days ago, radiating to the shoulder blade and left arm. Pain intensity was seven to eight out of ten yesterday, leading to an emergency department visit.  The pain is left-sided.  The pain worsens on exertion and is slightly relieved by rest.  He takes Imdur , recently increasing the dose from 30 mg to 60 mg, which decreased pain intensity. He denies current fatigue and shortness of breath.  No dyspnea, orthopnea, PND, palpitations, lower extremity swelling, fatigue, lightheadedness, dizziness, syncope.  Review of systems:  Please see the history of present illness. All other systems are reviewed and otherwise negative.      Studies Reviewed:    EKG Interpretation Date/Time:  Tuesday Oct 07 2023 13:41:10 EDT Ventricular Rate:  56 PR Interval:  180 QRS Duration:  84 QT Interval:  422 QTC Calculation: 407 R Axis:   24  Text Interpretation: Sinus bradycardia Nonspecific ST abnormality When compared with ECG of 14-Aug-2023 08:54, PR interval has decreased Confirmed by Palmer Bobo [phone]) on 10/07/2023 2:05:33 PM    Cardiac PET/CT 08/14/2023   Findings are consistent with small area of basal inferolateral ischemia. The study is low risk due to small defect size, normal MBFR and augmentation of EF with stress.   LV perfusion is abnormal. There is  evidence of ischemia. There is no evidence of infarction. Defect 1: There is a small defect with mild reduction in uptake present in the basal inferior and inferolateral location(s) that is reversible. There is abnormal wall motion in the defect area. Consistent with ischemia.   Rest left ventricular function is normal. Rest EF: 59%. Stress left ventricular function is normal. Stress EF: 64%. End diastolic cavity size is normal. End systolic cavity size is normal. No evidence of transient ischemic dilation  (TID) noted.   Myocardial blood flow was computed to be 0.19ml/g/min at rest and 1.21ml/g/min at stress. Global myocardial blood flow reserve was 2.81 and was normal. Regionally abnormal CFR in the basal inferolateral distribution, but overall normal MBFR.   Coronary calcium  assessment not performed due to prior revascularization.   Electronically signed by: Euell Herrlich, MD  Echocardiogram 09/08/2023 1. Left ventricular ejection fraction, by estimation, is 60 to 65%. Left  ventricular ejection fraction by 3D volume is 60 %. The left ventricle has  normal function. The left ventricle has no regional wall motion  abnormalities. Left ventricular diastolic   parameters are consistent with Grade II diastolic dysfunction  (pseudonormalization). The average left ventricular global longitudinal  strain is -17.8 %. The global longitudinal strain is normal.   2. Right ventricular systolic function is normal. The right ventricular  size is normal.   3. The mitral valve is normal in structure. Mild to moderate mitral valve  regurgitation. No evidence of mitral stenosis.   4. The aortic valve is tricuspid. Aortic valve regurgitation is not  visualized. No aortic stenosis is present.   5. The inferior vena cava is normal in size with greater than 50%  respiratory variability, suggesting right atrial pressure of 3 mmHg.   Risk Assessment/Calculations:              Physical Exam:   VS:  BP 114/66 (BP Location: Left Arm, Patient Position: Sitting, Cuff Size: Normal)   Pulse (!) 56   Ht 5\' 8"  (1.727 m)   Wt 173 lb (78.5 kg)   BMI 26.30 kg/m    Wt Readings from Last 3 Encounters:  10/07/23 173 lb (78.5 kg)  09/26/23 173 lb 12.8 oz (78.8 kg)  08/14/23 175 lb (79.4 kg)    GEN: Well nourished, well developed in no acute distress NECK: No JVD; No carotid bruits CARDIAC: RRR, no murmurs, rubs, gallops RESPIRATORY:  Clear to auscultation without rales, wheezing or rhonchi  ABDOMEN: Soft,  non-tender, non-distended EXTREMITIES:  No edema; No acute deformity      Assessment and Plan:  Coronary artery disease / Chest pain S/p CABG in 2002 Cardiac PET/CT 08/2023 consistent with small area of basal inferolateral ischemia that was low risk due to small defect size, normal MBFR and augmentation of EF of stress Echocardiogram 08/2023 with LVEF 60 to 65%, no RWMA, grade 2 DD - EKG today is without ischemic changes -Today patient comes in with ongoing severe chest pains.  Acutely worsened over the past 2 days.  Now pain seems to be worse and intermittent.  Pain tends to worsen on exertion and radiates to left shoulder blade and left arm. Pain is responsive to his imdur  by lowering pain from 7/10 to 3/10.  Was seen in the ED yesterday for CP and had unremarkable EKG and troponins - Spoke with DOD Dr. Alda Amas, who agreed for further ischemic evaluation with left heart catheterization given patient is on maximally tolerated antianginal therapy. He is currently on Imdur   60 mg daily and unable to be started on beta-blocking therapy due to bradycardia - Continue aspirin 81 mg daily, ezetimibe  10 mg daily, rosuvastatin  10 mg daily, nitroglycerin as needed, Imdur  60 mg daily  Patient is scheduled for left heart catheterization at 3 PM with Dr. Swaziland He is to hold his metformin  the day of and 48 hours after.  He is to take his aspirin tomorrow morning prior to cardiac cath.  Hyperlipidemia LDL 6, HDL 51, TG 117 on 08/2023 LDL under excellent control and under goal of less than 55 - Continue ezetimibe  10 mg daily and rosuvastatin  40 mg daily   Informed Consent   Shared Decision Making/Informed Consent The risks [stroke (1 in 1000), death (1 in 1000), kidney failure [usually temporary] (1 in 500), bleeding (1 in 200), allergic reaction [possibly serious] (1 in 200)], benefits (diagnostic support and management of coronary artery disease) and alternatives of a cardiac catheterization were discussed  in detail with Samuel Flowers and he is willing to proceed.     Dispo:  Return in about 3 weeks (around 10/28/2023).  Signed, Ava Boatman, NP

## 2023-10-07 NOTE — Telephone Encounter (Signed)
 Called patient and made him an appointment with [facility] today.

## 2023-10-07 NOTE — Telephone Encounter (Signed)
Multiple encounters open. Closing this encounter. 

## 2023-10-07 NOTE — Telephone Encounter (Signed)
 Called and left a voice mail to let patient know that I verified the dosage of the Imdur  and sent in a new prescription for Imdur  60 mg daily to the [facility] on [address].

## 2023-10-07 NOTE — Telephone Encounter (Signed)
 Pt is being seen in the office today with Palmer Bobo, NP at 1:30pm.

## 2023-10-08 ENCOUNTER — Other Ambulatory Visit: Payer: Self-pay

## 2023-10-08 ENCOUNTER — Encounter (HOSPITAL_COMMUNITY)
Admission: RE | Disposition: A | Discharge status: Home / Self Care | Payer: Self-pay | Source: Home / Self Care | Attending: Cardiology

## 2023-10-08 ENCOUNTER — Ambulatory Visit (HOSPITAL_COMMUNITY)
Admission: RE | Admit: 2023-10-08 | Discharge: 2023-10-08 | Disposition: A | Discharge status: Home / Self Care | Attending: Cardiology | Admitting: Cardiology

## 2023-10-08 DIAGNOSIS — R079 Chest pain, unspecified: Secondary | ICD-10-CM

## 2023-10-08 DIAGNOSIS — E785 Hyperlipidemia, unspecified: Secondary | ICD-10-CM | POA: Insufficient documentation

## 2023-10-08 DIAGNOSIS — Z951 Presence of aortocoronary bypass graft: Secondary | ICD-10-CM | POA: Insufficient documentation

## 2023-10-08 DIAGNOSIS — I25119 Atherosclerotic heart disease of native coronary artery with unspecified angina pectoris: Secondary | ICD-10-CM | POA: Insufficient documentation

## 2023-10-08 DIAGNOSIS — I2510 Atherosclerotic heart disease of native coronary artery without angina pectoris: Secondary | ICD-10-CM | POA: Diagnosis not present

## 2023-10-08 DIAGNOSIS — Z79899 Other long term (current) drug therapy: Secondary | ICD-10-CM | POA: Diagnosis not present

## 2023-10-08 DIAGNOSIS — Z7982 Long term (current) use of aspirin: Secondary | ICD-10-CM | POA: Insufficient documentation

## 2023-10-08 DIAGNOSIS — I2582 Chronic total occlusion of coronary artery: Secondary | ICD-10-CM | POA: Diagnosis not present

## 2023-10-08 HISTORY — PX: AORTIC ARCH ANGIOGRAPHY: CATH118224

## 2023-10-08 HISTORY — PX: LEFT HEART CATH AND CORS/GRAFTS ANGIOGRAPHY: CATH118250

## 2023-10-08 LAB — CBC
Hematocrit: 42.8 % (ref 37.5–51.0)
Hemoglobin: 14.3 g/dL (ref 13.0–17.7)
MCH: 31.8 pg (ref 26.6–33.0)
MCHC: 33.4 g/dL (ref 31.5–35.7)
MCV: 95 fL (ref 79–97)
Platelets: 207 10*3/uL (ref 150–450)
RBC: 4.5 x10E6/uL (ref 4.14–5.80)
RDW: 13.4 % (ref 11.6–15.4)
WBC: 6.3 10*3/uL (ref 3.4–10.8)

## 2023-10-08 LAB — BASIC METABOLIC PANEL WITH GFR
BUN/Creatinine Ratio: 16 (ref 10–24)
BUN: 14 mg/dL (ref 8–27)
CO2: 21 mmol/L (ref 20–29)
Calcium: 9 mg/dL (ref 8.6–10.2)
Chloride: 101 mmol/L (ref 96–106)
Creatinine, Ser: 0.87 mg/dL (ref 0.76–1.27)
Glucose: 74 mg/dL (ref 70–99)
Potassium: 4.5 mmol/L (ref 3.5–5.2)
Sodium: 139 mmol/L (ref 134–144)
eGFR: 92 mL/min/{1.73_m2} (ref >59)

## 2023-10-08 LAB — GLUCOSE, CAPILLARY: Glucose-Capillary: 70 mg/dL (ref 70–99)

## 2023-10-08 MED ORDER — FENTANYL CITRATE (PF) 100 MCG/2ML IJ SOLN
INTRAMUSCULAR | Status: AC
  Filled 2023-10-08: qty 2

## 2023-10-08 MED ORDER — LIDOCAINE HCL (PF) 1 % IJ SOLN
INTRAMUSCULAR | Status: DC | PRN
  Administered 2023-10-08: 10 mL

## 2023-10-08 MED ORDER — LABETALOL HCL 5 MG/ML IV SOLN: 10.0000 mg | INTRAVENOUS | Status: DC | PRN

## 2023-10-08 MED ORDER — MIDAZOLAM HCL 2 MG/2ML IJ SOLN
INTRAMUSCULAR | Status: DC | PRN
  Administered 2023-10-08: 1 mg via INTRAVENOUS

## 2023-10-08 MED ORDER — HEPARIN (PORCINE) IN NACL 1000-0.9 UT/500ML-% IV SOLN
INTRAVENOUS | Status: DC | PRN
Start: 2023-10-08 — End: 2023-10-09
  Administered 2023-10-08 (×2): 500 mL via INTRA_ARTERIAL

## 2023-10-08 MED ORDER — SODIUM CHLORIDE 0.9 % WEIGHT BASED INFUSION: 3.0000 mL/kg/h | INTRAVENOUS | Status: AC

## 2023-10-08 MED ORDER — LIDOCAINE HCL (PF) 1 % IJ SOLN
INTRAMUSCULAR | Status: AC
  Filled 2023-10-08: qty 30

## 2023-10-08 MED ORDER — METFORMIN HCL 500 MG PO TABS: 500.0000 mg | ORAL_TABLET | Freq: Two times a day (BID) | ORAL | Status: AC

## 2023-10-08 MED ORDER — HYDRALAZINE HCL 20 MG/ML IJ SOLN: 10.0000 mg | INTRAMUSCULAR | Status: DC | PRN

## 2023-10-08 MED ORDER — ASPIRIN 81 MG PO CHEW: 81.0000 mg | CHEWABLE_TABLET | ORAL | Status: DC

## 2023-10-08 MED ORDER — PANTOPRAZOLE SODIUM 40 MG PO TBEC
40.0000 mg | DELAYED_RELEASE_TABLET | Freq: Every day | ORAL | Status: DC
  Administered 2023-10-08: 40 mg via ORAL
  Filled 2023-10-08 (×2): qty 1

## 2023-10-08 MED ORDER — ACETAMINOPHEN 325 MG PO TABS: 650.0000 mg | ORAL_TABLET | ORAL | Status: DC | PRN

## 2023-10-08 MED ORDER — FENTANYL CITRATE (PF) 100 MCG/2ML IJ SOLN
INTRAMUSCULAR | Status: DC | PRN
  Administered 2023-10-08: 25 ug via INTRAVENOUS

## 2023-10-08 MED ORDER — MIDAZOLAM HCL 2 MG/2ML IJ SOLN
INTRAMUSCULAR | Status: AC
  Filled 2023-10-08: qty 2

## 2023-10-08 MED ORDER — PANTOPRAZOLE SODIUM 40 MG PO TBEC: 40.0000 mg | DELAYED_RELEASE_TABLET | Freq: Every day | ORAL | 1 refills | Status: AC

## 2023-10-08 MED ORDER — SODIUM CHLORIDE 0.9 % WEIGHT BASED INFUSION: 1.0000 mL/kg/h | INTRAVENOUS | Status: DC

## 2023-10-08 MED ORDER — SODIUM CHLORIDE 0.9 % IV SOLN: 250.0000 mL | INTRAVENOUS | Status: DC | PRN

## 2023-10-08 MED ORDER — SODIUM CHLORIDE 0.9% FLUSH: 3.0000 mL | Freq: Two times a day (BID) | INTRAVENOUS | Status: DC

## 2023-10-08 MED ORDER — IOHEXOL 350 MG/ML SOLN
INTRAVENOUS | Status: DC | PRN
Start: 2023-10-08 — End: 2023-10-09
  Administered 2023-10-08: 140 mL

## 2023-10-08 MED ORDER — SODIUM CHLORIDE 0.9% FLUSH
3.0000 mL | INTRAVENOUS | Status: DC | PRN
Start: 2023-10-08 — End: 2023-10-09

## 2023-10-08 MED ORDER — SODIUM CHLORIDE 0.9 % IV SOLN: INTRAVENOUS | Status: AC

## 2023-10-08 MED ORDER — ONDANSETRON HCL 4 MG/2ML IJ SOLN: 4.0000 mg | Freq: Four times a day (QID) | INTRAMUSCULAR | Status: DC | PRN

## 2023-10-08 NOTE — Progress Notes (Signed)
 SITE AREA: right groin  SITE PRIOR TO REMOVAL:  LEVEL 0  PRESSURE APPLIED FOR: approximately 25 minutes  MANUAL: yes, sheath removed by Davee Erm, 2H RN  PATIENT STATUS DURING PULL: stable  POST PULL SITE:  LEVEL 0   POST PULL INSTRUCTIONS GIVEN: yes, drsg x 24 hours, no sitting in water x 1 week, take it easy on stairs and climbing into and out of vehicles, if bleeding occurs  or you feel anything warm/moist to right groin while in hospital, notify staff, if bleeding occurs at home, call 911  POST PULL PULSES PRESENT: bilateral pedal pulse at +2  DRESSING APPLIED: gauze with tegaderm  BEDREST BEGINS @ 1638  COMMENTS:

## 2023-10-08 NOTE — Interval H&P Note (Signed)
 History and Physical Interval Note:  10/08/2023 2:57 PM  Dow Blahnik  has presented today for surgery, with the diagnosis of cad.  The various methods of treatment have been discussed with the patient and family. After consideration of risks, benefits and other options for treatment, the patient has consented to  Procedure(s): LEFT HEART CATH AND CORS/GRAFTS ANGIOGRAPHY (N/A) as a surgical intervention.  The patient's history has been reviewed, patient examined, no change in status, stable for surgery.  I have reviewed the patient's chart and labs.  Questions were answered to the patient's satisfaction.   Cath Lab Visit (complete for each Cath Lab visit)  Clinical Evaluation Leading to the Procedure:   ACS: Yes.    Non-ACS:    Anginal Classification: CCS IV  Anti-ischemic medical therapy: Minimal Therapy (1 class of medications)  Non-Invasive Test Results: No non-invasive testing performed  Prior CABG: Previous CABG        Donata Fryer [facility] 10/08/2023 2:57 PM

## 2023-10-08 NOTE — Progress Notes (Signed)
 Patient walked to the bathroom without difficulties. Right groin level 0, clean, dry, and intact.

## 2023-10-08 NOTE — Discharge Instructions (Addendum)
 Add Protonix  40 mg daily. Femoral Site Care This sheet gives you information about how to care for yourself after your procedure. Your health care provider may also give you more specific instructions. If you have problems or questions, contact your health care provider. What can I expect after the procedure?  After the procedure, it is common to have: Bruising that usually fades within 1-2 weeks. Tenderness at the site. Follow these instructions at home: Wound care Follow instructions from your health care provider about how to take care of your insertion site. Make sure you: Wash your hands with soap and water before you change your bandage (dressing). If soap and water are not available, use hand sanitizer. Remove your dressing as told by your health care provider. In 24 hours Do not take baths, swim, or use a hot tub until your health care provider approves. You may shower 24-48 hours after the procedure or as told by your health care provider. Gently wash the site with plain soap and water. Pat the area dry with a clean towel. Do not rub the site. This may cause bleeding. Do not apply powder or lotion to the site. Keep the site clean and dry. Check your femoral site every day for signs of infection. Check for: Redness, swelling, or pain. Fluid or blood. Warmth. Pus or a bad smell. Activity For the first 2-3 days after your procedure, or as long as directed: Avoid climbing stairs as much as possible. Do not squat. Do not lift anything that is heavier than 10 lb (4.5 kg), or the limit that you are told, until your health care provider says that it is safe. For 5 days Rest as directed. Avoid sitting for a long time without moving. Get up to take short walks every 1-2 hours. Do not drive for 24 hours if you were given a medicine to help you relax (sedative). General instructions Take over-the-counter and prescription medicines only as told by your health care provider. Keep all  follow-up visits as told by your health care provider. This is important. Contact a health care provider if you have: A fever or chills. You have redness, swelling, or pain around your insertion site. Get help right away if: The catheter insertion area swells very fast. You pass out. You suddenly start to sweat or your skin gets clammy. The catheter insertion area is bleeding, and the bleeding does not stop when you hold steady pressure on the area. The area near or just beyond the catheter insertion site becomes pale, cool, tingly, or numb. These symptoms may represent a serious problem that is an emergency. Do not wait to see if the symptoms will go away. Get medical help right away. Call your local emergency services (911 in the [place] Do not drive yourself to the hospital. Summary After the procedure, it is common to have bruising that usually fades within 1-2 weeks. Check your femoral site every day for signs of infection. Do not lift anything that is heavier than 10 lb (4.5 kg), or the limit that you are told, until your health care provider says that it is safe. This information is not intended to replace advice given to you by your health care provider. Make sure you discuss any questions you have with your health care provider. Document Revised: 05/12/2017 Document Reviewed: 05/12/2017 Elsevier Patient Education  2020 [employer].

## 2023-10-09 ENCOUNTER — Ambulatory Visit: Payer: Self-pay | Admitting: Internal Medicine

## 2023-10-09 ENCOUNTER — Ambulatory Visit: Payer: Self-pay | Admitting: Emergency Medicine

## 2023-10-09 ENCOUNTER — Encounter (HOSPITAL_COMMUNITY): Payer: Self-pay | Admitting: Cardiology

## 2023-10-09 ENCOUNTER — Telehealth: Payer: Self-pay | Admitting: Gastroenterology

## 2023-10-09 NOTE — Telephone Encounter (Signed)
 Chief Complaint: Discomfort in chest ("less than it has been") 1/10 pain level  Symptoms: 2/10 pain level in left shoulder blade Frequency: Intermittent  Disposition:  [x] Appointment(In office)  Additional Notes: Patient had an angiogram yesterday, 5/28, and any cardiac issues were ruled out. Pt was prescribed medications yesterday for gastric reflux. Pt states he sent multiple messages to Dr. Donnette Gal on MyChart to keep her updated. Pt was seen on 5/26 at hospital in [place] as he thought he was having a heart attack as he had severe chest pain and significant shoulder pain. Pt was not having a heart attack. Pt found out he had a thickening of his esophagus and a gallstone. Pt states a few years ago he had shingles and is wondering if this could be causing the pain. Pt is interested in getting lab work to rule out shingles before his appointment on Mon, 7/2. Pt also would like to see if there is a way to get him in for an appointment with GI sooner. Pt scheduled for a hospital follow up appointment with PCP on Mon, 6/2, in office. This RN educated pt on new-worsening symptoms and when to call back/seek emergent care. Pt verbalized understanding and agrees to plan.    Copied from CRM [id number]. Topic: Clinical - Red Word Triage >> Oct 09, 2023  9:51 AM [place] E wrote: [facility] that prompted transfer to Nurse Triage: Left shoulder pain and discomfort feeling in chest. Patient was seen yesterday at the hospital for this issue, but still having discomfort. Reason for Disposition  [1] Chest pain from known angina comes and goes AND [2] is NOT happening more often (increasing in frequency) or getting worse (increasing in severity)  Answer Assessment - Initial Assessment Questions Chief Complaint: Discomfort in chest ("less than it has been") 1/10 pain level  Symptoms: 2/10 pain level in left shoulder blade  Frequency: Intermittent  Protocols used: Chest Pain-A-AH

## 2023-10-09 NOTE — Telephone Encounter (Signed)
 Called patient to discuss provider recommendations. States he has started the Pantoprazole . Advised patient to continue taking medication daily until he is seen in June. Patient verbalized understanding. Patient requested to be placed on wait list. Done.

## 2023-10-09 NOTE — Telephone Encounter (Signed)
 Called and spoke with patient regarding pain Has had constant chest pain, shoulder, and back pain since Saturday  Is not exerting himself, nothing relieves pain but has not used NTG or tylenol /ibuprofen  Denies nausea, sweating, or shortness of breath  Pain in chest similar to when he had his bypass but shoulder pain like that when he had shingles in his neck about 6 years ago Stated that he was diagnosed with shingles but never had blisters/break out just the pain  Did take Protonix 40 mg last night, this am, Imdur  60 mg this morning  Will forward to [facility] for review who saw patient 5/27

## 2023-10-09 NOTE — Telephone Encounter (Signed)
 Inbound call from patient, states he was recently hospitalized for chest pain and had CT scans done. He states he is went to see the cardiologist and the reason for his chest pain was not cardiac related, and they told him to follow up with GI. He made an appointment for 6/23 at 9:20 AM, but would like for a provider to review his recent ED visit and advise if he needs to be seen sooner.

## 2023-10-10 ENCOUNTER — Other Ambulatory Visit: Payer: Self-pay | Admitting: Internal Medicine

## 2023-10-12 NOTE — Progress Notes (Unsigned)
 Subjective:    Patient ID: Samuel Flowers, male    DOB: 10-Mar-1951, 73 y.o.   MRN: [id number]     HPI Samuel Flowers is here for follow up of his chronic medical problems.  ED in [place] 5/26 - for chest pain  Went to ED for midsternal chest pain radiating into left shoulder x 1 day.  EKG without ischemic changes.  Troponin, CT without PE.  Imdur  increased from 30 mg daily to 60 mg daily.  Saw cardiology 5/27.  Severe chest pain radiates to shoulder blade and left arm.  Pain can reach 8/10 in intensity.  Pain is worse with exertion, slightly relieved by rest.  Increasing the Imdur  did decrease his pain intensity.  He denied any shortness of breath or fatigue.  He went for heart cath that day.  Which revealed patent grafts.  Minimal change compared to previous catheterization.  Medical therapy recommended and consider other causes of chest pain.  He continues to have pain in the left scapular region and intermittently in the left upper chest.  He has been feeling some numbness in his left arm.  This pain started 1 week ago.  It has been intermittent.  He did okay without much pain over the weekend and he woke up today without pain.  Shortly after waking up he did start to feel the pain.  The pain in his left shoulder blade can reach a 2/10.    Medications and allergies reviewed with patient and updated if appropriate.  Current Outpatient Medications on File Prior to Visit  Medication Sig Dispense Refill   aspirin  81 MG EC tablet Take 81 mg by mouth daily.     diphenhydrAMINE (BENADRYL) 25 MG tablet Take 25 mg by mouth every 6 (six) hours as needed for allergies.     ezetimibe  (ZETIA ) 10 MG tablet TAKE 1 TABLET(10 MG) BY MOUTH DAILY 90 tablet 3   hydrocortisone 2.5 % cream Apply 1 Application topically daily as needed (treat skin).     isosorbide  mononitrate (IMDUR ) 60 MG 24 hr tablet Take 1 tablet (60 mg total) by mouth daily. 90 tablet 3   levothyroxine  (SYNTHROID ) 25 MCG tablet  TAKE 1 TABLET(25 MCG) BY MOUTH DAILY 90 tablet 0   loratadine (CLARITIN) 10 MG tablet Take 10 mg by mouth daily.     metFORMIN  (GLUCOPHAGE ) 500 MG tablet Take 1 tablet (500 mg total) by mouth 2 (two) times daily with a meal.     nitroGLYCERIN  (NITROSTAT ) 0.4 MG SL tablet Place 1 tablet (0.4 mg total) under the tongue every 5 (five) minutes as needed for chest pain. 25 tablet 3   pantoprazole  (PROTONIX ) 40 MG tablet Take 1 tablet (40 mg total) by mouth daily. 30 tablet 1   rosuvastatin  (CRESTOR ) 40 MG tablet Take 1 tablet (40 mg total) by mouth daily. 90 tablet 3   Semaglutide ,0.25 or 0.5MG /DOS, (OZEMPIC , 0.25 OR 0.5 MG/DOSE,) 2 MG/3ML SOPN Inject 0.5 mg into the skin once a week. Dx I25.10, R73.03 3 mL 0   No current facility-administered medications on file prior to visit.     Review of Systems  Cardiovascular:  Positive for chest pain.  Musculoskeletal:  Positive for back pain.  Neurological:  Positive for numbness. Negative for weakness and headaches.       Objective:   Vitals:   10/13/23 0844  BP: 114/80  Pulse: (!) 50  Temp: 98.3 F (36.8 C)  SpO2: 94%   BP Readings from Last  3 Encounters:  10/13/23 114/80  10/08/23 123/76  10/07/23 114/66   Wt Readings from Last 3 Encounters:  10/13/23 174 lb (78.9 kg)  10/08/23 172 lb (78 kg)  10/07/23 173 lb (78.5 kg)   Body mass index is 26.46 kg/m.    Physical Exam Constitutional:      General: He is not in acute distress.    Appearance: Normal appearance. He is not ill-appearing.  HENT:     Head: Normocephalic and atraumatic.  Musculoskeletal:        General: Tenderness (Mild tenderness with palpation lateral upper scapula.  Some medial muscle tenderness near scapula) present.     Comments: No chest pain with palpation.  No arm pain with palpation  Skin:    General: Skin is warm and dry.     Findings: No rash.  Neurological:     Mental Status: He is alert.     Sensory: No sensory deficit.     Motor: No weakness.         Lab Results  Component Value Date   WBC 6.3 10/07/2023   HGB 14.3 10/07/2023   HCT 42.8 10/07/2023   PLT 207 10/07/2023   GLUCOSE 74 10/07/2023   CHOL 80 08/13/2023   TRIG 117.0 08/13/2023   HDL 51.20 08/13/2023   LDLDIRECT 129.0 07/03/2022   LDLCALC 6 08/13/2023   ALT 24 08/13/2023   AST 24 08/13/2023   NA 139 10/07/2023   K 4.5 10/07/2023   CL 101 10/07/2023   CREATININE 0.87 10/07/2023   BUN 14 10/07/2023   CO2 21 10/07/2023   TSH 2.31 08/13/2023   PSA 0.87 06/26/2021   HGBA1C 6.2 08/13/2023     Assessment & Plan:    See Problem List for Assessment and Plan of chronic medical problems.

## 2023-10-13 ENCOUNTER — Ambulatory Visit (INDEPENDENT_AMBULATORY_CARE_PROVIDER_SITE_OTHER)

## 2023-10-13 ENCOUNTER — Encounter: Payer: Self-pay | Admitting: Internal Medicine

## 2023-10-13 ENCOUNTER — Ambulatory Visit: Admitting: Internal Medicine

## 2023-10-13 VITALS — BP 114/80 | HR 50 | Temp 98.3°F | Ht 68.0 in | Wt 174.0 lb

## 2023-10-13 DIAGNOSIS — M5412 Radiculopathy, cervical region: Secondary | ICD-10-CM | POA: Insufficient documentation

## 2023-10-13 DIAGNOSIS — M4802 Spinal stenosis, cervical region: Secondary | ICD-10-CM | POA: Diagnosis not present

## 2023-10-13 DIAGNOSIS — M4722 Other spondylosis with radiculopathy, cervical region: Secondary | ICD-10-CM | POA: Diagnosis not present

## 2023-10-13 MED ORDER — PREDNISONE 20 MG PO TABS: 40.0000 mg | ORAL_TABLET | Freq: Every day | ORAL | 0 refills | Status: AC

## 2023-10-13 NOTE — Assessment & Plan Note (Signed)
 Acute Pain started 1 week ago Left upper back pain, intermittent left upper chest pain and numbness in left arm consistent with C6-7 radiculopathy X-ray of cervical spine today Prednisone 40 mg daily x 5 days Can consider physical therapy referral if no improvement He does plan on seeing a chiropractor but does not do adjustments Call if no improvement

## 2023-10-13 NOTE — Patient Instructions (Addendum)
      Have an xray of your neck downstairs.      Medications changes include :   prednisone 40 mg daily x 5 days     Return if symptoms worsen or fail to improve.

## 2023-10-19 ENCOUNTER — Ambulatory Visit: Payer: Self-pay | Admitting: Internal Medicine

## 2023-10-20 DIAGNOSIS — M9902 Segmental and somatic dysfunction of thoracic region: Secondary | ICD-10-CM | POA: Diagnosis not present

## 2023-10-20 DIAGNOSIS — M25512 Pain in left shoulder: Secondary | ICD-10-CM | POA: Diagnosis not present

## 2023-10-20 DIAGNOSIS — M62830 Muscle spasm of back: Secondary | ICD-10-CM | POA: Diagnosis not present

## 2023-10-20 DIAGNOSIS — M9901 Segmental and somatic dysfunction of cervical region: Secondary | ICD-10-CM | POA: Diagnosis not present

## 2023-10-28 ENCOUNTER — Encounter: Payer: Self-pay | Admitting: Internal Medicine

## 2023-10-28 DIAGNOSIS — M9903 Segmental and somatic dysfunction of lumbar region: Secondary | ICD-10-CM | POA: Diagnosis not present

## 2023-10-28 DIAGNOSIS — M62830 Muscle spasm of back: Secondary | ICD-10-CM | POA: Diagnosis not present

## 2023-10-28 DIAGNOSIS — M9901 Segmental and somatic dysfunction of cervical region: Secondary | ICD-10-CM | POA: Diagnosis not present

## 2023-10-28 DIAGNOSIS — M9902 Segmental and somatic dysfunction of thoracic region: Secondary | ICD-10-CM | POA: Diagnosis not present

## 2023-10-30 ENCOUNTER — Encounter: Payer: Self-pay | Admitting: Emergency Medicine

## 2023-10-30 ENCOUNTER — Other Ambulatory Visit: Payer: Self-pay | Admitting: *Deleted

## 2023-10-30 ENCOUNTER — Ambulatory Visit: Attending: Emergency Medicine | Admitting: Emergency Medicine

## 2023-10-30 VITALS — BP 118/68 | HR 52 | Ht 68.0 in | Wt 173.0 lb

## 2023-10-30 DIAGNOSIS — I25810 Atherosclerosis of coronary artery bypass graft(s) without angina pectoris: Secondary | ICD-10-CM

## 2023-10-30 DIAGNOSIS — M62830 Muscle spasm of back: Secondary | ICD-10-CM | POA: Diagnosis not present

## 2023-10-30 DIAGNOSIS — E785 Hyperlipidemia, unspecified: Secondary | ICD-10-CM | POA: Diagnosis not present

## 2023-10-30 DIAGNOSIS — M9902 Segmental and somatic dysfunction of thoracic region: Secondary | ICD-10-CM | POA: Diagnosis not present

## 2023-10-30 DIAGNOSIS — M9903 Segmental and somatic dysfunction of lumbar region: Secondary | ICD-10-CM | POA: Diagnosis not present

## 2023-10-30 DIAGNOSIS — M9901 Segmental and somatic dysfunction of cervical region: Secondary | ICD-10-CM | POA: Diagnosis not present

## 2023-10-30 DIAGNOSIS — Z79899 Other long term (current) drug therapy: Secondary | ICD-10-CM

## 2023-10-30 NOTE — Patient Instructions (Addendum)
 Medication Instructions:  NO CHANGES  Lab Work: BMET TO BE DONE TODAY.   Testing/Procedures: NONE  Follow-Up: At [facility], you and your health needs are our priority.  As part of our continuing mission to provide you with exceptional heart care, our providers are all part of one team.  This team includes your primary Cardiologist (physician) and Advanced Practice Providers or APPs (Physician Assistants and Nurse Practitioners) who all work together to provide you with the care you need, when you need it.  Your next appointment:   6 MONTHS  Provider:   MADISON FOUNTAIN, DNP

## 2023-10-30 NOTE — Progress Notes (Signed)
 Cardiology Office Note:    Date:  10/30/2023  ID:  Samuel Flowers, DOB [birth year], MRN [id number] PCP: Colene Dauphin, MD  [facility] Providers Cardiologist:  Alexandria Angel, MD       Patient Profile:       Chief Complaint: Follow-up after cardiac catheterization History of Present Illness:  Samuel Flowers is a 73 y.o. male with visit-pertinent history of CAD s/p CABG in 2002, hyperlipidemia   Patient had a coronary artery bypass and graft in 2002.  His last cardiac catheterization in 2003 revealed patent grafts but there was stenosis in the posterior lateral branch that was not grafted.  Myoview  in July 2022 showed LVEF 51%, mild to moderate ischemia in the mid to apical inferior wall.  This was reviewed by Dr. Audery Blazing who felt it was low risk.  Patient was treated medically. He was seen by Dr. Audery Blazing May 2024 at which time he had dyspnea with more extreme activity but not with routine activity.     Patient was last seen in clinic on 08/14/2023 by [place], [place].  He was been having increased episodes of shortness of breath and fatigue.  He also noted chronic episodes of intermittent chest discomfort with heavy exercise.  Echocardiogram was ordered and completed on 09/08/2023 showing normal LVEF of 60 to 65%, no RWMA, grade 2 DD, normal RV function and size, mild to moderate MR.  Cardiac PET/CT was ordered and completed on 08/14/2023 showing findings consistent with small area of basal inferolateral ischemia.  This study is low risk due to small defect size, normal MBFR and augmentation of EF with stress.  There was no evidence of infarction.   He was recently seen in the ED 10/06/2023 for chest pain.  He had a normal ED workup.  EKG showed no acute ST/T wave changes and troponin was normal.  CT PE showed no PE.  He was seen for an acute visit on 10/07/2023 for ED follow-up.  He was experiencing severe chest pain radiating to his shoulder blade and left arm.  The pain worsens on exertion and is  slightly relieved by rest.  EKG was without ischemic changes.  Cardiac catheterization was ordered and completed on 09/30/2023 showing severe three-vessel obstructive CAD, patent LIMA to LAD with distal LAD diffusely diseased, patent RIMA to PAD, patent SVG to first diagonal, patent SVG to first OM, sequential portion of the graft to the second OM is occluded, with normal LV function normal LVEDP.  Compared to prior catheterization in 2004 the only changes occlusion of the sequential portion of SVG to OM 2 which appears to be chronic.  Medical therapy was recommended.   Discussed the use of AI scribe software for clinical note transcription with the patient, who gave verbal consent to proceed.  History of Present Illness Samuel Flowers is a 73 year old male with coronary artery disease and prior CABG who presents with chest pain.  Today patient comes in the clinic by himself.  He is without any acute cardiovascular concerns or complaints.  Since last he was seen in office his chest pains has improved.  He no longer experiences any exertional chest pains.  He does have some atypical symptoms occasionally that he referred as his prior anginal equivalent.  Now when these episodes occur which is very infrequently his pain is less than one on a ten scale.  He tells me these pains been going on now for 3 to 4 years.  He takes 60 mg of imdur ,  which alleviates chest pain but at times may cause some lightheadedness or occasional headache.  He denies any syncope or presyncope.  No leg swelling, orthopnea, PND, shortness of breath, or palpitations. Weight is stable. He works daily and walks approximately six miles but feels his heart rate does not increase sufficiently.  He has mild esophageal issues and with plans to see a GI specialist for further evaluation.  Review of systems:  Please see the history of present illness. All other systems are reviewed and otherwise negative.      Studies Reviewed:         Cardiac catheterization 10/08/2023   RPAV-1 lesion is 95% stenosed.   RPAV-2 lesion is 90% stenosed.   RPDA lesion is 100% stenosed.   Ost LAD to Prox LAD lesion is 80% stenosed.   Mid LAD lesion is 99% stenosed.   Dist LAD lesion is 99% stenosed.   Ost Cx to Prox Cx lesion is 90% stenosed.   Prox Cx to Mid Cx lesion is 100% stenosed with 100% stenosed side branch in 2nd Mrg.   Origin to Prox Graft lesion between 1st Mrg and 2nd Mrg  is 100% stenosed.   SVG graft was visualized by angiography and is normal in caliber.   SVG graft was visualized by angiography and is normal in caliber.   LIMA graft was visualized by non-selective angiography and is normal in caliber.   RIMA graft was visualized by non-selective angiography and is normal in caliber.   The graft exhibits no disease.   The graft exhibits no disease.   The graft exhibits no disease.   The left ventricular systolic function is normal.   LV end diastolic pressure is normal.   The left ventricular ejection fraction is 55-65% by visual estimate.   Severe 3 vessel obstructive CAD Patent LIMA to LAD. The distal LAD is diffusely diseased Patent RIMA to the PDA Patent SVG to the first diagonal Patent SVG to the first OM. Sequential portion of the graft to the second OM is occluded.  Normal LV function Normal LVEDP Mildly dilated aorta without evidence of dissection Diagnostic Dominance: Right  Risk Assessment/Calculations:              Physical Exam:   VS:  BP 118/68 (BP Location: Left Arm, Patient Position: Sitting, Cuff Size: Normal)   Pulse (!) 52   Ht 5' 8 (1.727 m)   Wt 173 lb (78.5 kg)   BMI 26.30 kg/m    Wt Readings from Last 3 Encounters:  10/30/23 173 lb (78.5 kg)  10/13/23 174 lb (78.9 kg)  10/08/23 172 lb (78 kg)    GEN: Well nourished, well developed in no acute distress NECK: No JVD; No carotid bruits CARDIAC: RRR, no murmurs, rubs, gallops RESPIRATORY:  Clear to auscultation without rales,  wheezing or rhonchi  ABDOMEN: Soft, non-tender, non-distended EXTREMITIES:  No edema; No acute deformity      Assessment and Plan:  Coronary artery disease S/p CABG in 2022 Echocardiogram 08/2023 with LVEF 60 to 65%, no RWMA Cardiac PET/CT/25 consistent with inferolateral ischemia that was low risk LHC 09/2023 showing patent LIMA to LAD, RIMA to PDA, SVG to first diagonal, SVG to first OM with sequential portion of the graft to second OM being occluded.  Compared with prior cath in 2004 the only change was occlusion of the sequential portion of SVG to OM 2 which appears to be chronic.  Medical management was recommended. - Today patient reports improvement in  chronic chest discomfort (symptoms for now for 3-4 years which are similar to pre-CABG symptoms).  He is stable today. He no longer experiences any exertional symptoms.  Able to walk 6 miles daily without symptoms.  Given reassuring coronary catheterization there is no indication further ischemic evaluation at this time - Continue aspirin  81 mg daily, ezetimibe  10 mg daily, isosorbide  60 mg daily, rosuvastatin  40 mg daily, nitroglycerin  as needed - BMET today  Hyperlipidemia LDL 6, HDL 51, TG 117 on 08/2023 LDL under excellent control and under goal of less than 55 - Continue ezetimibe  10 mg daily and rosuvastatin  40 mg daily      Dispo:  Return in about 6 months (around 04/30/2024).  Signed, Ava Boatman, NP

## 2023-10-31 ENCOUNTER — Encounter: Payer: Self-pay | Admitting: Physician Assistant

## 2023-10-31 ENCOUNTER — Encounter: Payer: Self-pay | Admitting: Internal Medicine

## 2023-10-31 LAB — BASIC METABOLIC PANEL WITH GFR
BUN/Creatinine Ratio: 10 (ref 10–24)
BUN: 9 mg/dL (ref 8–27)
CO2: 24 mmol/L (ref 20–29)
Calcium: 9.8 mg/dL (ref 8.6–10.2)
Chloride: 99 mmol/L (ref 96–106)
Creatinine, Ser: 0.89 mg/dL (ref 0.76–1.27)
Glucose: 89 mg/dL (ref 70–99)
Potassium: 5.3 mmol/L — ABNORMAL HIGH (ref 3.5–5.2)
Sodium: 138 mmol/L (ref 134–144)
eGFR: 91 mL/min/{1.73_m2} (ref >59)

## 2023-11-02 MED ORDER — SEMAGLUTIDE (1 MG/DOSE) 4 MG/3ML ~~LOC~~ SOPN: 1.0000 mg | PEN_INJECTOR | SUBCUTANEOUS | 4 refills | Status: DC

## 2023-11-02 NOTE — Addendum Note (Signed)
 Addended by: GEOFM GLADE PARAS on: 11/02/2023 01:08 PM   Modules accepted: Orders

## 2023-11-03 ENCOUNTER — Ambulatory Visit: Payer: Self-pay | Admitting: Emergency Medicine

## 2023-11-03 ENCOUNTER — Ambulatory Visit: Admitting: Physician Assistant

## 2023-11-05 ENCOUNTER — Other Ambulatory Visit (HOSPITAL_COMMUNITY)

## 2023-11-13 DIAGNOSIS — S71131A Puncture wound without foreign body, right thigh, initial encounter: Secondary | ICD-10-CM | POA: Diagnosis not present

## 2023-11-13 DIAGNOSIS — W540XXA Bitten by dog, initial encounter: Secondary | ICD-10-CM | POA: Diagnosis not present

## 2023-11-13 DIAGNOSIS — Z23 Encounter for immunization: Secondary | ICD-10-CM | POA: Diagnosis not present

## 2023-11-26 ENCOUNTER — Ambulatory Visit

## 2023-12-03 MED ORDER — SEMAGLUTIDE (2 MG/DOSE) 8 MG/3ML ~~LOC~~ SOPN: 2.0000 mg | PEN_INJECTOR | SUBCUTANEOUS | 5 refills | Status: AC

## 2023-12-03 NOTE — Addendum Note (Signed)
 Addended by: GEOFM GLADE PARAS on: 12/03/2023 05:18 AM   Modules accepted: Orders

## 2024-01-05 ENCOUNTER — Ambulatory Visit (INDEPENDENT_AMBULATORY_CARE_PROVIDER_SITE_OTHER)

## 2024-01-05 VITALS — Ht 68.0 in | Wt 173.0 lb

## 2024-01-05 DIAGNOSIS — Z0000 Encounter for general adult medical examination without abnormal findings: Secondary | ICD-10-CM

## 2024-01-05 NOTE — Progress Notes (Signed)
 Subjective:   Samuel Flowers is a 73 y.o. who presents for a Medicare Wellness preventive visit.  As a reminder, Annual Wellness Visits don't include a physical exam, and some assessments may be limited, especially if this visit is performed virtually. We may recommend an in-person follow-up visit with your provider if needed.  Visit Complete: Virtual I connected with  [name] on 01/05/24 by a audio enabled telemedicine application and verified that I am speaking with the correct person using two identifiers.  Patient Location: Home  Provider Location: Home Office  I discussed the limitations of evaluation and management by telemedicine. The patient expressed understanding and agreed to proceed.  Vital Signs: Because this visit was a virtual/telehealth visit, some criteria may be missing or patient reported. Any vitals not documented were not able to be obtained and vitals that have been documented are patient reported.  VideoDeclined- This patient declined Librarian, academic. Therefore the visit was completed with audio only.  Persons Participating in Visit: Patient.  AWV Questionnaire: No: Patient Medicare AWV questionnaire was not completed prior to this visit.  Cardiac Risk Factors include: advanced age (>16men, >35 women);male gender;dyslipidemia     Objective:    Today's Vitals   01/05/24 1442  Weight: 173 lb (78.5 kg)  Height: 5' 8 (1.727 m)   Body mass index is 26.3 kg/m.     01/05/2024    3:00 PM 10/08/2023    1:18 PM 11/05/2021   11:05 AM 05/05/2014   10:08 AM  Advanced Directives  Does Patient Have a Medical Advance Directive? Yes No Yes No   Type of Advance Directive Living will  Living will;Healthcare Power of Attorney   Does patient want to make changes to medical advance directive?   No - Patient declined   Copy of Healthcare Power of Attorney in Chart? No - copy requested  No - copy requested   Would patient like  information on creating a medical advance directive?  No - Patient declined  No - patient declined information      Data saved with a previous flowsheet row definition    Current Medications (verified) Outpatient Encounter Medications as of 01/05/2024  Medication Sig   aspirin  81 MG EC tablet Take 81 mg by mouth daily.   diphenhydrAMINE (BENADRYL) 25 MG tablet Take 25 mg by mouth every 6 (six) hours as needed for allergies.   ezetimibe  (ZETIA ) 10 MG tablet TAKE 1 TABLET(10 MG) BY MOUTH DAILY   isosorbide  mononitrate (IMDUR ) 60 MG 24 hr tablet Take 1 tablet (60 mg total) by mouth daily.   levothyroxine  (SYNTHROID ) 25 MCG tablet TAKE 1 TABLET(25 MCG) BY MOUTH DAILY   loratadine (CLARITIN) 10 MG tablet Take 10 mg by mouth daily.   metFORMIN  (GLUCOPHAGE ) 500 MG tablet Take 1 tablet (500 mg total) by mouth 2 (two) times daily with a meal.   nitroGLYCERIN  (NITROSTAT ) 0.4 MG SL tablet Place 1 tablet (0.4 mg total) under the tongue every 5 (five) minutes as needed for chest pain.   pantoprazole  (PROTONIX ) 40 MG tablet Take 1 tablet (40 mg total) by mouth daily.   rosuvastatin  (CRESTOR ) 40 MG tablet Take 1 tablet (40 mg total) by mouth daily.   Semaglutide , 2 MG/DOSE, 8 MG/3ML SOPN Inject 2 mg as directed once a week.   No facility-administered encounter medications on file as of 01/05/2024.    Allergies (verified) Grass pollen([id number]-swt vern), Pollen extract, and Cat dander   History: Past Medical History:  Diagnosis  Date   Acute asthmatic bronchitis    Allergy    pollen   BPH (benign prostatic hypertrophy)    mild   C. difficile colitis    CAD (coronary artery disease)    Colitis    Difficulty urinating 2017   self cath for all urination   Fatty liver    Hyperlipemia    Hypothyroidism    IBS (irritable bowel syndrome)    Nonspecific colitis    Rhinitis, allergic    Past Surgical History:  Procedure Laterality Date   AORTIC ARCH ANGIOGRAPHY N/A 10/08/2023   Procedure: AORTIC  ARCH ANGIOGRAPHY;  Surgeon: Swaziland, Peter M, MD;  Location: [facility] INVASIVE CV LAB;  Service: Cardiovascular;  Laterality: N/A;   CATARACT EXTRACTION, BILATERAL Bilateral 2016   COLONOSCOPY  2010   CORONARY ARTERY BYPASS GRAFT  2002   EYE SURGERY     LEFT HEART CATH AND CORS/GRAFTS ANGIOGRAPHY N/A 10/08/2023   Procedure: LEFT HEART CATH AND CORS/GRAFTS ANGIOGRAPHY;  Surgeon: Swaziland, Peter M, MD;  Location: [facility] INVASIVE CV LAB;  Service: Cardiovascular;  Laterality: N/A;   LUMBAR LAMINECTOMY  1990   Family History  Problem Relation Age of Onset   Colon polyps Father    Coronary artery disease Father    Heart disease Father    Diabetes Father    Hyperlipidemia Father    Coronary artery disease Mother    Mental illness Mother    Colon cancer Paternal Aunt    Irritable bowel syndrome Neg Hx    Stroke Neg Hx    Alcohol abuse Neg Hx    Cancer Neg Hx    Depression Neg Hx    Early death Neg Hx    Hypertension Neg Hx    Esophageal cancer Neg Hx    Rectal cancer Neg Hx    Stomach cancer Neg Hx    Social History   Socioeconomic History   Marital status: Married    Spouse name: Nena   Number of children: 2   Years of education: Not on file   Highest education level: Not on file  Occupational History   Occupation: Retired  Tobacco Use   Smoking status: Never   Smokeless tobacco: Never  Vaping Use   Vaping status: Never Used  Substance and Sexual Activity   Alcohol use: Yes    Alcohol/week: 3.0 standard drinks of alcohol    Types: 3 Standard drinks or equivalent per week    Comment: occasionally   Drug use: No   Sexual activity: Yes  Other Topics Concern   Not on file  Social History Narrative   Lives with spouse/2025   Social Drivers of Health   Financial Resource Strain: Low Risk  (01/05/2024)   Overall Financial Resource Strain (CARDIA)    Difficulty of Paying Living Expenses: Not hard at all  Food Insecurity: No Food Insecurity (01/05/2024)   Hunger Vital Sign     Worried About Running Out of Food in the Last Year: Never true    Ran Out of Food in the Last Year: Never true  Transportation Needs: No Transportation Needs (01/05/2024)   PRAPARE - Administrator, Civil Service (Medical): No    Lack of Transportation (Non-Medical): No  Physical Activity: Sufficiently Active (01/05/2024)   Exercise Vital Sign    Days of Exercise per Week: 7 days    Minutes of Exercise per Session: 150+ min  Stress: No Stress Concern Present (01/05/2024)   [employer] of Occupational Health -  Occupational Stress Questionnaire    Feeling of Stress: Not at all  Social Connections: Moderately Isolated (01/05/2024)   Social Connection and Isolation Panel    Frequency of Communication with Friends and Family: More than three times a week    Frequency of Social Gatherings with Friends and Family: Three times a week    Attends Religious Services: Never    Active Member of Clubs or Organizations: No    Attends Banker Meetings: Never    Marital Status: Married    Tobacco Counseling Counseling given: Not Answered    Clinical Intake:  Pre-visit preparation completed: Yes  Pain : No/denies pain     BMI - recorded: 26.3 Nutritional Status: BMI 25 -29 Overweight Nutritional Risks: None Diabetes: No  Lab Results  Component Value Date   HGBA1C 6.2 08/13/2023   HGBA1C 6.1 07/03/2022   HGBA1C 6.2 08/31/2021     How often do you need to have someone help you when you read instructions, pamphlets, or other written materials from your doctor or pharmacy?: 1 - Never  Interpreter Needed?: No  Information entered by :: Kathlean Cinco, RMA   Activities of Daily Living     01/05/2024    2:44 PM  In your present state of health, do you have any difficulty performing the following activities:  Hearing? 1  Comment Wears hearing aides  Vision? 0  Difficulty concentrating or making decisions? 0  Walking or climbing stairs? 0  Dressing or  bathing? 0  Doing errands, shopping? 0  Preparing Food and eating ? N  Using the Toilet? N  In the past six months, have you accidently leaked urine? Y  Comment uses a catherter  Do you have problems with loss of bowel control? N  Managing your Medications? N  Managing your Finances? N  Housekeeping or managing your Housekeeping? N    Patient Care Team: Geofm Glade PARAS, MD as PCP - General (Internal Medicine) Pietro Redell RAMAN, MD as PCP - Cardiology (Cardiology) Camillo Golas, MD as Attending Physician (Ophthalmology)  I have updated your Care Teams any recent Medical Services you may have received from other providers in the past year.     Assessment:   This is a routine wellness examination for [place].  Hearing/Vision screen Hearing Screening - Comments:: Wears hearing aides Vision Screening - Comments:: Wears eyeglasses/Dr. Camillo   Goals Addressed             This Visit's Progress    Continue to get younger every year.   On track      Depression Screen     01/05/2024    3:02 PM 08/13/2023   11:25 AM 07/03/2022    2:10 PM 11/05/2021   11:08 AM 06/29/2021    2:52 PM 11/16/2020    7:51 AM 11/11/2019    9:31 AM  PHQ 2/9 Scores  PHQ - 2 Score 0 0 0 0 0 0 0  PHQ- 9 Score 1          Fall Risk     01/05/2024    3:01 PM 08/13/2023   11:25 AM 07/03/2022    2:10 PM 11/05/2021   11:06 AM 06/29/2021    2:52 PM  Fall Risk   Falls in the past year? 0 0 0 0 1  Number falls in past yr: 0 0 0 0 1  Injury with Fall? 0 0 0 0 0  Risk for fall due to :  No Fall Risks No  Fall Risks No Fall Risks No Fall Risks  Follow up Falls evaluation completed;Falls prevention discussed Falls evaluation completed Falls evaluation completed Falls evaluation completed  Falls evaluation completed      Data saved with a previous flowsheet row definition    MEDICARE RISK AT HOME:  Medicare Risk at Home Any stairs in or around the home?: Yes (2 story) If so, are there any without handrails?:  No Home free of loose throw rugs in walkways, pet beds, electrical cords, etc?: Yes Adequate lighting in your home to reduce risk of falls?: Yes Life alert?: No Use of a cane, walker or w/c?: No Grab bars in the bathroom?: Yes Shower chair or bench in shower?: Yes Elevated toilet seat or a handicapped toilet?: Yes  TIMED UP AND GO:  Was the test performed?  No  Cognitive Function: Declined/Normal: No cognitive concerns noted by patient or family. Patient alert, oriented, able to answer questions appropriately and recall recent events. No signs of memory loss or confusion.        11/05/2021   11:23 AM  6CIT Screen  What Year? 0 points  What month? 0 points  What time? 0 points  Count back from 20 0 points  Months in reverse 0 points  Repeat phrase 0 points  Total Score 0 points    Immunizations Immunization History  Administered Date(s) Administered   Fluad Quad(high Dose 65+) 07/03/2022   INFLUENZA, HIGH DOSE SEASONAL PF 07/03/2017, 06/16/2018, 02/26/2019   Influenza Whole 02/10/2009   Influenza-Unspecified 03/19/2021   Moderna SARS-COV2 Booster Vaccination 07/30/2020   PFIZER(Purple Top)SARS-COV-2 Vaccination 06/17/2019, 07/12/2019, 01/27/2020   Pfizer Covid-19 Vaccine Bivalent Booster 78yrs & up 01/21/2021   Pneumococcal Conjugate-13 07/03/2017   Pneumococcal Polysaccharide-23 10/30/2011, 11/16/2020   Tdap 10/30/2011    Screening Tests Health Maintenance  Topic Date Due   Zoster Vaccines- Shingrix (1 of 2) Never done   DTaP/Tdap/Td (2 - Td or Tdap) 10/29/2021   Medicare Annual Wellness (AWV)  11/06/2022   COVID-19 Vaccine (5 - 2024-25 season) 01/12/2023   INFLUENZA VACCINE  12/12/2023   Colonoscopy  04/28/2026   Pneumococcal Vaccine: 50+ Years  Completed   Hepatitis C Screening  Completed   HPV VACCINES  Aged Out   Meningococcal B Vaccine  Aged Out    Health Maintenance  Health Maintenance Due  Topic Date Due   Zoster Vaccines- Shingrix (1 of 2) Never  done   DTaP/Tdap/Td (2 - Td or Tdap) 10/29/2021   Medicare Annual Wellness (AWV)  11/06/2022   COVID-19 Vaccine (5 - 2024-25 season) 01/12/2023   INFLUENZA VACCINE  12/12/2023   Health Maintenance Items Addressed: See Nurse Notes at the end of this note  Additional Screening:  Vision Screening: Recommended annual ophthalmology exams for early detection of glaucoma and other disorders of the eye. Would you like a referral to an eye doctor? No    Dental Screening: Recommended annual dental exams for proper oral hygiene  Community Resource Referral / Chronic Care Management: CRR required this visit?  No   CCM required this visit?  No   Plan:    I have personally reviewed and noted the following in the patient's chart:   Medical and social history Use of alcohol, tobacco or illicit drugs  Current medications and supplements including opioid prescriptions. Patient is not currently taking opioid prescriptions. Functional ability and status Nutritional status Physical activity Advanced directives List of other physicians Hospitalizations, surgeries, and ER visits in previous 12 months Vitals Screenings to  include cognitive, depression, and falls Referrals and appointments  In addition, I have reviewed and discussed with patient certain preventive protocols, quality metrics, and best practice recommendations. A written personalized care plan for preventive services as well as general preventive health recommendations were provided to patient.   Gunther Zawadzki L Ryanna Teschner, CMA   01/05/2024   After Visit Summary: (MyChart) Due to this being a telephonic visit, the after visit summary with patients personalized plan was offered to patient via MyChart   Notes: Patient is due for a Shingles vaccine.  Patient stated that he had received a Tdap at [facility], however, it has not been documented in [place]  Patient also stated that he and his wife are very pleased with Dr. Geofm for their primary care  provider.  He stated that she is a very good and thorough doctor.  Patient had no concerns to address today.

## 2024-01-05 NOTE — Patient Instructions (Signed)
 Samuel Flowers , Thank you for taking time out of your busy schedule to complete your Annual Wellness Visit with me. I enjoyed our conversation and look forward to speaking with you again next year. I, as well as your care team,  appreciate your ongoing commitment to your health goals. Please review the following plan we discussed and let me know if I can assist you in the future. Your Game plan/ To Do List     Follow up Visits: We will see or speak with you next year for your Next Medicare AWV with our clinical staff Have you seen your provider in the last 6 months (3 months if uncontrolled diabetes)? Yes.  Last office visit on 10/13/2023.  Clinician Recommendations:  Aim for 30 minutes of exercise or brisk walking, 6-8 glasses of water, and 5 servings of fruits and vegetables each day. You are due for a Shingles vaccine and can get that done at your local pharmacy.        This is a list of the screenings recommended for you:  Health Maintenance  Topic Date Due   Zoster (Shingles) Vaccine (1 of 2) Never done   DTaP/Tdap/Td vaccine (2 - Td or Tdap) 10/29/2021   Medicare Annual Wellness Visit  11/06/2022   COVID-19 Vaccine (5 - 2024-25 season) 01/12/2023   Flu Shot  12/12/2023   Colon Cancer Screening  04/28/2026   Pneumococcal Vaccine for age over 38  Completed   Hepatitis C Screening  Completed   HPV Vaccine  Aged Out   Meningitis B Vaccine  Aged Out    Advanced directives: (Copy Requested) Please bring a copy of your health care power of attorney and living will to the office to be added to your chart at your convenience. You can mail to [facility] [address] [place], [place] [address] or email to [email or web address] Advance Care Planning is important because it:  [x]  Makes sure you receive the medical care that is consistent with your values, goals, and preferences  [x]  It provides guidance to your family and loved ones and reduces their decisional burden  about whether or not they are making the right decisions based on your wishes.  Follow the link provided in your after visit summary or read over the paperwork we have mailed to you to help you started getting your Advance Directives in place. If you need assistance in completing these, please reach out to us  so that we can help you!  See attachments for Preventive Care and Fall Prevention Tips.

## 2024-01-06 ENCOUNTER — Encounter: Payer: Self-pay | Admitting: Physician Assistant

## 2024-01-06 ENCOUNTER — Ambulatory Visit: Admitting: Physician Assistant

## 2024-01-06 VITALS — BP 102/70 | HR 64 | Ht 65.75 in | Wt 170.2 lb

## 2024-01-06 DIAGNOSIS — R0789 Other chest pain: Secondary | ICD-10-CM | POA: Diagnosis not present

## 2024-01-06 DIAGNOSIS — Z860101 Personal history of adenomatous and serrated colon polyps: Secondary | ICD-10-CM | POA: Diagnosis not present

## 2024-01-06 DIAGNOSIS — R933 Abnormal findings on diagnostic imaging of other parts of digestive tract: Secondary | ICD-10-CM | POA: Diagnosis not present

## 2024-01-06 NOTE — Progress Notes (Signed)
 Chief Complaint: Atypical chest pain and esophageal issues  HPI:    Samuel Flowers is a 73 year old male with a past medical history as listed below including CAD (/28/25 echo with LVEF 60-65% and mild to moderate mitral valve leakage), IBS and multiple others, known to Dr. Shila, who was referred to me by Geofm Glade PARAS, MD for a complaint of atypical chest pain and esophageal issues.    04/29/2019 colonoscopy with 1 less than 1 mm polyp in the cecum, one 5 mm polyp in the ascending colon nonbleeding internal hemorrhoids.  Pathology showed tubular adenoma and repeat recommended in 7 years.    09/24/2023 cardiac perfusion study showed consistent with small area of basal inferior lateral ischemia.    10/06/2023 CT angio of the chest abdomen pelvis showed cholelithiasis without biliary duct distention, mild esophageal distention, query esophagitis of the bladder wall thickening and distention of the urinary bladder unchanged from prior study.    10/07/2023 BMP and CBC normal.    10/08/2023 cardiac cath that showed severe three-vessel obstructive CAD.  At that time not much progression from prior cath in 2004.  It all appeared chronic.  Recommended medical therapy and consider other causes of chest pain.    10/09/2023 patient called and discussed that he had been hospitalized for chest pain and had CT scans.  He told him to follow-up with GI as there is no cardiac etiology.  CT at the time showed thickening of his esophagus and a gallstone.  Left sided heart cath on 10/08/2023 with no blockage.  Cardiologist prescribed Pantoprazole  40 mg daily.    10/30/2023 BMP with potassium minimally elevated 5.3 and otherwise normal.    10/30/2023 patient followed with cardiology and at that time his chest pain had improved.    Today, the patient tells me that he has had no further issues with chest pain.  In fact he diagnosed himself with a pinched nerve which he thinks radiated around from his neck into his chest and  felt similar to prior heart attack that he had.  He had a full cardiac workup and they do not think it is cardiac in etiology.  He was placed on Pantoprazole  which he was taking 40 mg once a day for about 2 months due to a finding of possible esophagitis on the CT which they thought may be contributing, but he has no further symptoms and in fact stopped taking the Pantoprazole  2 months ago and has had no further issues.  No reflux, no heartburn, no trouble swallowing, no other GI issues other than very occasional constipation for which he changes his diet and is able to resolve.    Denies fever, chills, weight loss or symptoms that awaken him from sleep.  Past Medical History:  Diagnosis Date   Acute asthmatic bronchitis    Allergy    pollen   BPH (benign prostatic hypertrophy)    mild   C. difficile colitis    CAD (coronary artery disease)    Colitis    Difficulty urinating 2017   self cath for all urination   Fatty liver    Hyperlipemia    Hypothyroidism    IBS (irritable bowel syndrome)    Nonspecific colitis    Rhinitis, allergic     Past Surgical History:  Procedure Laterality Date   AORTIC ARCH ANGIOGRAPHY N/A 10/08/2023   Procedure: AORTIC ARCH ANGIOGRAPHY;  Surgeon: Swaziland, Peter M, MD;  Location: [facility] INVASIVE CV LAB;  Service: Cardiovascular;  Laterality: N/A;  CATARACT EXTRACTION, BILATERAL Bilateral 2016   COLONOSCOPY  2010   CORONARY ARTERY BYPASS GRAFT  2002   EYE SURGERY     LEFT HEART CATH AND CORS/GRAFTS ANGIOGRAPHY N/A 10/08/2023   Procedure: LEFT HEART CATH AND CORS/GRAFTS ANGIOGRAPHY;  Surgeon: Swaziland, Peter M, MD;  Location: [facility] INVASIVE CV LAB;  Service: Cardiovascular;  Laterality: N/A;   LUMBAR LAMINECTOMY  1990    Current Outpatient Medications  Medication Sig Dispense Refill   aspirin  81 MG EC tablet Take 81 mg by mouth daily.     diphenhydrAMINE (BENADRYL) 25 MG tablet Take 25 mg by mouth every 6 (six) hours as needed for allergies.     ezetimibe  (ZETIA )  10 MG tablet TAKE 1 TABLET(10 MG) BY MOUTH DAILY 90 tablet 3   isosorbide  mononitrate (IMDUR ) 60 MG 24 hr tablet Take 1 tablet (60 mg total) by mouth daily. 90 tablet 3   levothyroxine  (SYNTHROID ) 25 MCG tablet TAKE 1 TABLET(25 MCG) BY MOUTH DAILY 90 tablet 0   loratadine (CLARITIN) 10 MG tablet Take 10 mg by mouth daily.     metFORMIN  (GLUCOPHAGE ) 500 MG tablet Take 1 tablet (500 mg total) by mouth 2 (two) times daily with a meal.     nitroGLYCERIN  (NITROSTAT ) 0.4 MG SL tablet Place 1 tablet (0.4 mg total) under the tongue every 5 (five) minutes as needed for chest pain. 25 tablet 3   pantoprazole  (PROTONIX ) 40 MG tablet Take 1 tablet (40 mg total) by mouth daily. 30 tablet 1   rosuvastatin  (CRESTOR ) 40 MG tablet Take 1 tablet (40 mg total) by mouth daily. 90 tablet 3   Semaglutide , 2 MG/DOSE, 8 MG/3ML SOPN Inject 2 mg as directed once a week. 3 mL 5   No current facility-administered medications for this visit.    Allergies as of 01/06/2024 - Review Complete 01/05/2024  Allergen Reaction Noted   Grass pollen([id number] vern)  11/28/2021   Pollen extract  10/21/2020   Cat dander Itching 11/05/2021    Family History  Problem Relation Age of Onset   Colon polyps Father    Coronary artery disease Father    Heart disease Father    Diabetes Father    Hyperlipidemia Father    Coronary artery disease Mother    Mental illness Mother    Colon cancer Paternal Aunt    Irritable bowel syndrome Neg Hx    Stroke Neg Hx    Alcohol abuse Neg Hx    Cancer Neg Hx    Depression Neg Hx    Early death Neg Hx    Hypertension Neg Hx    Esophageal cancer Neg Hx    Rectal cancer Neg Hx    Stomach cancer Neg Hx     Social History   Socioeconomic History   Marital status: Married    Spouse name: Nena   Number of children: 2   Years of education: Not on file   Highest education level: Not on file  Occupational History   Occupation: Retired  Tobacco Use   Smoking status: Never    Smokeless tobacco: Never  Vaping Use   Vaping status: Never Used  Substance and Sexual Activity   Alcohol use: Yes    Alcohol/week: 3.0 standard drinks of alcohol    Types: 3 Standard drinks or equivalent per week    Comment: occasionally   Drug use: No   Sexual activity: Yes  Other Topics Concern   Not on file  Social History Narrative   Lives  with spouse/2025   Social Drivers of Health   Financial Resource Strain: Low Risk  (01/05/2024)   Overall Financial Resource Strain (CARDIA)    Difficulty of Paying Living Expenses: Not hard at all  Food Insecurity: No Food Insecurity (01/05/2024)   Hunger Vital Sign    Worried About Running Out of Food in the Last Year: Never true    Ran Out of Food in the Last Year: Never true  Transportation Needs: No Transportation Needs (01/05/2024)   PRAPARE - Administrator, Civil Service (Medical): No    Lack of Transportation (Non-Medical): No  Physical Activity: Sufficiently Active (01/05/2024)   Exercise Vital Sign    Days of Exercise per Week: 7 days    Minutes of Exercise per Session: 150+ min  Stress: No Stress Concern Present (01/05/2024)   [employer] of Occupational Health - Occupational Stress Questionnaire    Feeling of Stress: Not at all  Social Connections: Moderately Isolated (01/05/2024)   Social Connection and Isolation Panel    Frequency of Communication with Friends and Family: More than three times a week    Frequency of Social Gatherings with Friends and Family: Three times a week    Attends Religious Services: Never    Active Member of Clubs or Organizations: No    Attends Banker Meetings: Never    Marital Status: Married  Catering manager Violence: Not At Risk (01/05/2024)   Humiliation, Afraid, Rape, and Kick questionnaire    Fear of Current or Ex-Partner: No    Emotionally Abused: No    Physically Abused: No    Sexually Abused: No    Review of Systems:    Constitutional: No weight loss,  fever or chills Skin: No rash Cardiovascular: No chest pain, chest pressure or palpitations   Respiratory: No SOB  Gastrointestinal: See HPI and otherwise negative Genitourinary: No dysuria  Neurological: No headache, dizziness or syncope Musculoskeletal: No new muscle or joint pain Hematologic: No bleeding Psychiatric: No history of depression or anxiety   Physical Exam:  Vital signs: BP 102/70 (BP Location: Left Arm, Patient Position: Sitting, Cuff Size: Normal)   Pulse 64   Ht 5' 5.75 (1.67 m) Comment: height measured without shoes  Wt 170 lb 4 oz (77.2 kg)   BMI 27.69 kg/m    Constitutional:   Pleasant Caucasian male appears to be in NAD, Well developed, Well nourished, alert and cooperative Head:  Normocephalic and atraumatic. Eyes:   PEERL, EOMI. No icterus. Conjunctiva pink. Ears:  Normal auditory acuity. Neck:  Supple Throat: Oral cavity and pharynx without inflammation, swelling or lesion.  Respiratory: Respirations even and unlabored. Lungs clear to auscultation bilaterally.   No wheezes, crackles, or rhonchi.  Cardiovascular: Normal S1, S2. No MRG. Regular rate and rhythm. No peripheral edema, cyanosis or pallor.  Gastrointestinal:  Soft, nondistended, nontender. No rebound or guarding. Normal bowel sounds. No appreciable masses or hepatomegaly. Rectal:  Not performed.  Msk:  Symmetrical without gross deformities. Without edema, no deformity or joint abnormality.  Neurologic:  Alert and  oriented x4;  grossly normal neurologically.  Skin:   Dry and intact without significant lesions or rashes. Psychiatric: Demonstrates good judgement and reason without abnormal affect or behaviors.  Most recent LABS AND IMAGING: CBC    Component Value Date/Time   WBC 6.3 10/07/2023 1451   WBC 6.7 08/13/2023 1152   RBC 4.50 10/07/2023 1451   RBC 4.61 08/13/2023 1152   HGB 14.3 10/07/2023 1451  HCT 42.8 10/07/2023 1451   PLT 207 10/07/2023 1451   MCV 95 10/07/2023 1451   MCH  31.8 10/07/2023 1451   MCH 32.0 05/05/2014 1027   MCHC 33.4 10/07/2023 1451   MCHC 33.8 08/13/2023 1152   RDW 13.4 10/07/2023 1451   LYMPHSABS 1.7 08/13/2023 1152   MONOABS 0.7 08/13/2023 1152   EOSABS 0.2 08/13/2023 1152   BASOSABS 0.0 08/13/2023 1152    CMP     Component Value Date/Time   NA 138 10/30/2023 1206   K 5.3 (H) 10/30/2023 1206   CL 99 10/30/2023 1206   CO2 24 10/30/2023 1206   GLUCOSE 89 10/30/2023 1206   GLUCOSE 92 08/13/2023 1152   BUN 9 10/30/2023 1206   CREATININE 0.89 10/30/2023 1206   CALCIUM  9.8 10/30/2023 1206   PROT 7.2 08/13/2023 1152   PROT 7.0 11/07/2020 1131   ALBUMIN 4.3 08/13/2023 1152   ALBUMIN 4.6 11/07/2020 1131   AST 24 08/13/2023 1152   ALT 24 08/13/2023 1152   ALKPHOS 48 08/13/2023 1152   BILITOT 0.5 08/13/2023 1152   BILITOT 0.4 11/07/2020 1131   GFRNONAA 61 (L) 05/05/2014 1027   GFRAA 70 (L) 05/05/2014 1027    Assessment: 1.  Atypical chest pain: Recent hospitalization for heart attack like pain, workup revealed no acute cardiac etiology, was thought this is possibly gastric in origin with a CT showing esophagitis, patient placed on Pantoprazole  40 mg, he thinks more likely was related to a pinched nerve which is since resolved itself, no further chest pain in the past 3 months 2.  CAD: Patient being followed by cardiology, he does have chronic blockages but these are not thought the cause of his recent atypical chest pain, normal EF 3.  Abnormal CT of the esophagus: Showed slow transit and possible esophagitis, patient was placed on Pantoprazole  40 mg which she took daily for a couple of months, no further issues with atypical chest pain, reflux, heartburn, dysphagia; likely esophagitis or imaging abnormality now resolved 4.  Adenomatous polyp: Repeat colonoscopy recommended in 2027  Plan: 1.  Patient is doing well now with no further issues with chest pain, heartburn, reflux or dysphagia.  The Pantoprazole  he took 40 mg daily for a  couple of months likely resolved his esophagitis seen on CT.  No further workup necessary unless he develops further issues. 2.  Offered repeat CT but patient declined. 3.  Patient to follow in clinic with us  as needed or in 2027 for his repeat colonoscopy.    Samuel Failing, PA-C [facility] 01/06/2024, 1:59 PM  Cc: Geofm Glade PARAS, MD

## 2024-01-08 ENCOUNTER — Other Ambulatory Visit: Payer: Self-pay | Admitting: Internal Medicine

## 2024-01-21 ENCOUNTER — Other Ambulatory Visit: Payer: Self-pay | Admitting: Cardiology

## 2024-02-16 ENCOUNTER — Other Ambulatory Visit: Payer: Self-pay | Admitting: Internal Medicine

## 2024-02-16 ENCOUNTER — Other Ambulatory Visit: Payer: Self-pay | Admitting: Cardiology

## 2025-01-05 ENCOUNTER — Ambulatory Visit
